# Patient Record
Sex: Female | Born: 1935 | Race: White | Hispanic: No | Smoking: Never smoker
Health system: Southern US, Community
[De-identification: ages and names within clinical notes are randomized; demographics above are authoritative.]

## PROBLEM LIST (undated history)

## (undated) DIAGNOSIS — I447 Left bundle-branch block, unspecified: Secondary | ICD-10-CM

## (undated) DIAGNOSIS — I8390 Asymptomatic varicose veins of unspecified lower extremity: Secondary | ICD-10-CM

## (undated) DIAGNOSIS — M1990 Unspecified osteoarthritis, unspecified site: Secondary | ICD-10-CM

## (undated) DIAGNOSIS — I2510 Atherosclerotic heart disease of native coronary artery without angina pectoris: Secondary | ICD-10-CM

## (undated) DIAGNOSIS — D649 Anemia, unspecified: Secondary | ICD-10-CM

## (undated) DIAGNOSIS — K219 Gastro-esophageal reflux disease without esophagitis: Secondary | ICD-10-CM

## (undated) HISTORY — PX: CHOLECYSTECTOMY: SHX55

## (undated) HISTORY — PX: KYPHOPLASTY: SHX5884

## (undated) HISTORY — PX: HIP FRACTURE SURGERY: SHX118

## (undated) HISTORY — PX: APPENDECTOMY: SHX54

---

## 1999-03-21 ENCOUNTER — Encounter (INDEPENDENT_AMBULATORY_CARE_PROVIDER_SITE_OTHER): Payer: Self-pay | Admitting: Specialist

## 1999-03-21 ENCOUNTER — Ambulatory Visit (HOSPITAL_COMMUNITY): Admission: RE | Admit: 1999-03-21 | Discharge: 1999-03-21 | Payer: Self-pay | Admitting: *Deleted

## 2000-01-18 ENCOUNTER — Other Ambulatory Visit: Admission: RE | Admit: 2000-01-18 | Discharge: 2000-01-18 | Payer: Self-pay | Admitting: Internal Medicine

## 2001-04-01 ENCOUNTER — Ambulatory Visit (HOSPITAL_COMMUNITY): Admission: RE | Admit: 2001-04-01 | Discharge: 2001-04-01 | Payer: Self-pay | Admitting: Internal Medicine

## 2001-04-11 ENCOUNTER — Emergency Department (HOSPITAL_COMMUNITY): Admission: EM | Admit: 2001-04-11 | Discharge: 2001-04-11 | Payer: Self-pay | Admitting: Emergency Medicine

## 2001-04-11 ENCOUNTER — Encounter: Payer: Self-pay | Admitting: Emergency Medicine

## 2001-06-05 ENCOUNTER — Encounter: Payer: Self-pay | Admitting: Otolaryngology

## 2001-06-05 ENCOUNTER — Encounter: Admission: RE | Admit: 2001-06-05 | Discharge: 2001-06-05 | Payer: Self-pay | Admitting: Otolaryngology

## 2001-07-10 ENCOUNTER — Ambulatory Visit (HOSPITAL_COMMUNITY): Admission: RE | Admit: 2001-07-10 | Discharge: 2001-07-10 | Payer: Self-pay | Admitting: *Deleted

## 2001-07-10 ENCOUNTER — Encounter (INDEPENDENT_AMBULATORY_CARE_PROVIDER_SITE_OTHER): Payer: Self-pay | Admitting: Specialist

## 2003-08-25 ENCOUNTER — Ambulatory Visit (HOSPITAL_COMMUNITY): Admission: RE | Admit: 2003-08-25 | Discharge: 2003-08-25 | Payer: Self-pay | Admitting: *Deleted

## 2003-08-25 ENCOUNTER — Encounter (INDEPENDENT_AMBULATORY_CARE_PROVIDER_SITE_OTHER): Payer: Self-pay | Admitting: Specialist

## 2004-02-27 ENCOUNTER — Encounter: Admission: RE | Admit: 2004-02-27 | Discharge: 2004-03-15 | Payer: Self-pay | Admitting: Internal Medicine

## 2004-11-15 ENCOUNTER — Ambulatory Visit: Payer: Self-pay | Admitting: Physical Medicine & Rehabilitation

## 2004-11-15 ENCOUNTER — Inpatient Hospital Stay (HOSPITAL_COMMUNITY): Admission: RE | Admit: 2004-11-15 | Discharge: 2004-11-19 | Payer: Self-pay | Admitting: Orthopedic Surgery

## 2004-11-19 ENCOUNTER — Ambulatory Visit: Payer: Self-pay | Admitting: Physical Medicine & Rehabilitation

## 2004-11-19 ENCOUNTER — Inpatient Hospital Stay
Admission: RE | Admit: 2004-11-19 | Discharge: 2004-12-03 | Payer: Self-pay | Admitting: Physical Medicine & Rehabilitation

## 2004-12-20 ENCOUNTER — Ambulatory Visit (HOSPITAL_COMMUNITY): Admission: RE | Admit: 2004-12-20 | Discharge: 2004-12-20 | Payer: Self-pay | Admitting: Internal Medicine

## 2005-09-08 ENCOUNTER — Emergency Department (HOSPITAL_COMMUNITY): Admission: EM | Admit: 2005-09-08 | Discharge: 2005-09-08 | Payer: Self-pay | Admitting: Emergency Medicine

## 2005-10-01 ENCOUNTER — Encounter (HOSPITAL_BASED_OUTPATIENT_CLINIC_OR_DEPARTMENT_OTHER): Admission: RE | Admit: 2005-10-01 | Discharge: 2005-10-09 | Payer: Self-pay | Admitting: Surgery

## 2007-12-15 ENCOUNTER — Encounter: Payer: Self-pay | Admitting: Gastroenterology

## 2007-12-17 ENCOUNTER — Encounter: Payer: Self-pay | Admitting: Gastroenterology

## 2007-12-17 ENCOUNTER — Encounter: Admission: RE | Admit: 2007-12-17 | Discharge: 2007-12-17 | Payer: Self-pay | Admitting: Internal Medicine

## 2007-12-24 ENCOUNTER — Telehealth: Payer: Self-pay | Admitting: Gastroenterology

## 2008-01-08 ENCOUNTER — Ambulatory Visit: Payer: Self-pay | Admitting: Gastroenterology

## 2008-01-08 DIAGNOSIS — R159 Full incontinence of feces: Secondary | ICD-10-CM | POA: Insufficient documentation

## 2008-01-08 DIAGNOSIS — R109 Unspecified abdominal pain: Secondary | ICD-10-CM | POA: Insufficient documentation

## 2008-01-08 DIAGNOSIS — D126 Benign neoplasm of colon, unspecified: Secondary | ICD-10-CM | POA: Insufficient documentation

## 2008-01-08 DIAGNOSIS — K5730 Diverticulosis of large intestine without perforation or abscess without bleeding: Secondary | ICD-10-CM | POA: Insufficient documentation

## 2008-01-08 DIAGNOSIS — I219 Acute myocardial infarction, unspecified: Secondary | ICD-10-CM | POA: Insufficient documentation

## 2008-01-08 DIAGNOSIS — Z860100 Personal history of colon polyps, unspecified: Secondary | ICD-10-CM | POA: Insufficient documentation

## 2008-01-08 DIAGNOSIS — I2510 Atherosclerotic heart disease of native coronary artery without angina pectoris: Secondary | ICD-10-CM | POA: Insufficient documentation

## 2008-01-08 DIAGNOSIS — K589 Irritable bowel syndrome without diarrhea: Secondary | ICD-10-CM | POA: Insufficient documentation

## 2008-01-08 DIAGNOSIS — K921 Melena: Secondary | ICD-10-CM | POA: Insufficient documentation

## 2008-01-08 DIAGNOSIS — K859 Acute pancreatitis without necrosis or infection, unspecified: Secondary | ICD-10-CM

## 2008-01-08 LAB — CONVERTED CEMR LAB
ALT: 20 units/L (ref 0–35)
BUN: 20 mg/dL (ref 6–23)
CO2: 32 meq/L (ref 19–32)
Calcium: 9.1 mg/dL (ref 8.4–10.5)
Creatinine, Ser: 0.8 mg/dL (ref 0.4–1.2)
GFR calc non Af Amer: 72 mL/min
Potassium: 4.5 meq/L (ref 3.5–5.1)
Sodium: 144 meq/L (ref 135–145)
Total Protein: 6.4 g/dL (ref 6.0–8.3)

## 2008-01-12 ENCOUNTER — Ambulatory Visit: Payer: Self-pay | Admitting: Gastroenterology

## 2008-01-12 ENCOUNTER — Encounter: Payer: Self-pay | Admitting: Gastroenterology

## 2008-01-15 ENCOUNTER — Encounter: Payer: Self-pay | Admitting: Gastroenterology

## 2008-01-18 ENCOUNTER — Ambulatory Visit (HOSPITAL_COMMUNITY): Admission: RE | Admit: 2008-01-18 | Discharge: 2008-01-18 | Payer: Self-pay | Admitting: Gastroenterology

## 2008-01-26 ENCOUNTER — Encounter: Payer: Self-pay | Admitting: Gastroenterology

## 2008-02-01 ENCOUNTER — Ambulatory Visit: Payer: Self-pay | Admitting: Gastroenterology

## 2008-02-01 DIAGNOSIS — K219 Gastro-esophageal reflux disease without esophagitis: Secondary | ICD-10-CM

## 2008-02-02 ENCOUNTER — Encounter: Payer: Self-pay | Admitting: Gastroenterology

## 2009-05-10 ENCOUNTER — Encounter: Admission: RE | Admit: 2009-05-10 | Discharge: 2009-05-10 | Payer: Self-pay | Admitting: Internal Medicine

## 2009-05-22 ENCOUNTER — Encounter: Admission: RE | Admit: 2009-05-22 | Discharge: 2009-05-22 | Payer: Self-pay | Admitting: Internal Medicine

## 2009-07-18 ENCOUNTER — Telehealth: Payer: Self-pay | Admitting: Gastroenterology

## 2009-07-21 ENCOUNTER — Encounter (INDEPENDENT_AMBULATORY_CARE_PROVIDER_SITE_OTHER): Payer: Self-pay | Admitting: *Deleted

## 2009-08-23 ENCOUNTER — Ambulatory Visit (HOSPITAL_COMMUNITY): Admission: RE | Admit: 2009-08-23 | Discharge: 2009-08-24 | Payer: Self-pay | Admitting: Otolaryngology

## 2009-08-23 ENCOUNTER — Encounter (INDEPENDENT_AMBULATORY_CARE_PROVIDER_SITE_OTHER): Payer: Self-pay | Admitting: Otolaryngology

## 2009-09-11 ENCOUNTER — Ambulatory Visit: Payer: Self-pay | Admitting: Gastroenterology

## 2009-09-11 DIAGNOSIS — R933 Abnormal findings on diagnostic imaging of other parts of digestive tract: Secondary | ICD-10-CM

## 2009-09-11 LAB — CONVERTED CEMR LAB
ALT: 18 units/L (ref 0–35)
AST: 30 units/L (ref 0–37)
Albumin: 3.8 g/dL (ref 3.5–5.2)
Basophils Relative: 0.4 % (ref 0.0–3.0)
Bilirubin, Direct: 0.1 mg/dL (ref 0.0–0.3)
Eosinophils Relative: 5.6 % — ABNORMAL HIGH (ref 0.0–5.0)
Hemoglobin: 12 g/dL (ref 12.0–15.0)
Lymphocytes Relative: 31.5 % (ref 12.0–46.0)
Lymphs Abs: 2.1 10*3/uL (ref 0.7–4.0)
MCHC: 34.5 g/dL (ref 30.0–36.0)
Monocytes Absolute: 0.6 10*3/uL (ref 0.1–1.0)
Neutrophils Relative %: 53.1 % (ref 43.0–77.0)
Platelets: 190 10*3/uL (ref 150.0–400.0)
RDW: 13.9 % (ref 11.5–14.6)
Total Protein: 6.7 g/dL (ref 6.0–8.3)
WBC: 6.6 10*3/uL (ref 4.5–10.5)

## 2009-09-13 ENCOUNTER — Ambulatory Visit (HOSPITAL_COMMUNITY): Admission: RE | Admit: 2009-09-13 | Discharge: 2009-09-13 | Payer: Self-pay | Admitting: Gastroenterology

## 2009-09-21 ENCOUNTER — Telehealth: Payer: Self-pay | Admitting: Gastroenterology

## 2010-04-17 NOTE — Assessment & Plan Note (Signed)
Summary: FOLLOW-UP, MAY NEED REPEAT MRCP.            Nicole   History of Present Illness Visit Type: Follow-up Visit Primary GI MD: Nicole Heaps MD [facility] Primary Provider: Juline Patch, MD Requesting Provider: na Chief Complaint: Patient complains of lower abdominal pain, fecal incontinence, and dark stools. She can not narrow down a time frame as to how long her stools have been dark she just says they are sometimes.  History of Present Illness:   [name] has returned for followup of her pancreatitis and pancreatic cyst.  She was last seen in November, 2009 following an episode of acute pancreatitis.  MRCP showed mild bile duct dilatation and a 2 cm multilobulated cystic lesion in the genu of  the pancreas.  She has not had any recurrences of severe abdominal pain.  Followup MRI was recommended in 6 weeks but this was not done.  She has mild postprandial burning epigastric pain for which he takes omeprazole.  She takes Aleve daily.  She complains of intermittent solid  black stools.  This latter complaint has been ongoing for 2 years.   GI Review of Systems    Reports abdominal pain.     Location of  Abdominal pain: lower abdomen.    Denies acid reflux, belching, bloating, chest pain, dysphagia with liquids, dysphagia with solids, heartburn, loss of appetite, nausea, vomiting, vomiting blood, weight loss, and  weight gain.      Reports black tarry stools and  fecal incontinence.     Denies anal fissure, change in bowel habit, constipation, diarrhea, diverticulosis, heme positive stool, hemorrhoids, irritable bowel syndrome, jaundice, light color stool, liver problems, rectal bleeding, and  rectal pain.    Current Medications (verified): 1)  Fish Oil 1200 Mg Caps (Omega-3 Fatty Acids) .Marland Kitchen.. 1 Capsule By Mouth Once Daily 2)  Icaps  Caps (Multiple Vitamins-Minerals) .Marland Kitchen.. 1 Capsule By Mouth Once Daily 3)  Sertraline Hcl 50 Mg Tabs (Sertraline Hcl) .Marland Kitchen.. 1 Tablet By Mouth Once Daily 4)   Klor-Con 10 10 Meq Cr-Tabs (Potassium Chloride) .... Take One-Two Tabs By Mouth Once Daily 5)  Furosemide 40 Mg Tabs (Furosemide) .... Take Two  Tablets By Mouth Once Daily 6)  Lunesta 2 Mg Tabs (Eszopiclone) .... Take One By Mouth At Bedtime 7)  Omeprazole 20 Mg Cpdr (Omeprazole) .Marland Kitchen.. 1 Tablet By Mouth Two Times A Day 30 Minutes Before Meals 8)  Multivitamins  Tabs (Multiple Vitamin) .... Take One By Mouth Once Daily 9)  Oscal 500/200 D-3 500-200 Mg-Unit Tabs (Calcium-Vitamin D) .... Take One By Mouth Once Daily 10)  Tylenol Pm Extra Strength 500-25 Mg Tabs (Diphenhydramine-Apap (Sleep)) .... Take One By Mouth At Bedtime 11)  Aleve 220 Mg Tabs (Naproxen Sodium) .... Take Two By Mouth Once Daily  Allergies (verified): 1)  ! Pcn  Past History:  Past Medical History: Reviewed history from 01/08/2008 and no changes required. Current Problems:  IBS (ICD-564.1) COLONIC POLYPS (ICD-211.3) AMI (ICD-410.90)  Past Surgical History: knee arthroscopy Rt. 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 No FH of Colon Cancer: Family History of Breast Cancer:Aunt and cousin Father died age 72  Stomach Problems  Social History: Reviewed history from 02/01/2008 and no changes required. Patient has never smoked.  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  The patient denies allergy/sinus, anemia, anxiety-new, blood in urine, breast changes/lumps, change in vision, confusion, cough, coughing up blood, depression-new, fainting, fatigue, fever, headaches-new, heart murmur, heart rhythm changes, itching, menstrual pain, night sweats, nosebleeds, pregnancy  symptoms, shortness of breath, skin rash, sore throat, swollen lymph glands, thirst - excessive , urination - excessive , urination changes/pain, and vision changes.         All other systems were reviewed and were negative   Vital Signs:  Patient profile:   77 year old female Height:      61.5 inches Weight:      147.4 pounds BMI:     27.50 Pulse rate:   84 / minute Pulse rhythm:   regular BP sitting:   108 / 64  (left arm) Cuff size:   regular  Vitals Entered By: [name] CMA [name]) (September 11, 2009 11:13 AM)  Physical Exam  Additional Exam:  On physical exam she is an elderly but healthy-appearing female  skin: anicteric HEENT: normocephalic; PEERLA; no nasal or pharyngeal abnormalities neck: supple nodes: no cervical lymphadenopathy chest: clear to ausculatation and percussion heart: no murmurs, gallops, or rubs abd: soft, nontender; BS normoactive; no abdominal masses, tenderness, organomegaly rectal: deferred ext: no cynanosis, clubbing, edema skeletal: no deformities neuro: oriented x 3; no focal abnormalities    Impression & Recommendations:  Problem # 1:  NONSPECIFIC ABN FINDING RAD & OTH EXAM GI TRACT (ICD-793.4) Etiology for her pancreatitis was never determined.  She had a pancreatic cyst.  History of intermittent black stools raises the question of GI bleeding.  Upper endoscopy for this problem in the past was pertinent for  reactive gastropathy only.  Medications #1 followup MRCP and MRI #2 CBC, amylase and LFTs  Problem # 2:  PERSONAL HISTORY OF COLONIC POLYPS (ICD-V12.72) Plan  no further followup given the patient's age  Problem # 3:  CAD (ICD-414.00) Assessment: Comment Only  Other Orders: TLB-CBC Platelet - w/Differential (85025-CBCD) TLB-Hepatic/Liver Function Pnl (80076-HEPATIC) TLB-Lipase (83690-LIPASE) TLB-Amylase (82150-AMYL) MRCP (MRCP)  Patient Instructions: 1)  Copy sent to : Nicole Pang,MD 2)  You will go to the basment for  labs today 3)  Your MRCP is scheduled for 09/13/2009 at [facility] radiology at 9am 4)  The medication list was reviewed and reconciled.  All changed / newly prescribed medications were explained.  A complete medication list was provided to the patient / caregiver.

## 2010-04-17 NOTE — Progress Notes (Signed)
Summary: MRI Needs to be scheduled  Phone Note Outgoing Call   Call placed by: Laureen Ochs LPN,  Jul 18, 1608 11:13 AM Call placed to: Patient Summary of Call: Pt. needs a f/u MRI scheduled. Message left for patient to callback.  Initial call taken by: Laureen Ochs LPN,  Jul 19, 9602 11:14 AM  Follow-up for Phone Call        Message left for patient to callback. Laureen Ochs LPN  Jul 20, 5407 9:35 AM  Message left for patient to callback. Laureen Ochs LPN  Jul 20, 8117 10:11 AM    Follow-up by: Laureen Ochs LPN,  Jul 21, 1476 11:47 AM  Additional Follow-up for Phone Call Additional follow up Details #1::        At this point she should be seen in the office Additional Follow-up by: Louis Meckel MD,  Jul 21, 2009 12:10 PM    Additional Follow-up for Phone Call Additional follow up Details #2::    Message left for pt. that Dr.Dandra Shambaugh wants to see her in the office, appt. scheduled for 08-01-09 at 2:30pm. I will also mail her a reminder letter. Pt. instructed to call back as needed.  Follow-up by: Laureen Ochs LPN,  Jul 22, 2954 3:27 PM

## 2010-04-17 NOTE — Letter (Signed)
Summary: Appt Reminder 2  [facility]  [address] [place], [place] [address]   Phone: [phone]  Fax: [phone]        Jul 21, 2009 MRN: [id number]    [name] [address] [place], [place]  [address]    Dear Ms. Cowley,   You have a return appointment with Dr.Robert Arlyce Dice on 08-01-09 at 2:30pm.  Please remember to bring a complete list of the medicines you are taking, your insurance card and your co-pay.  If you have to cancel or reschedule this appointment, please call before 5:00 pm the evening before to avoid a cancellation fee.  If you have any questions or concerns, please call [phone].    Sincerely,    Laureen Ochs LPN  Appended Document: Appt Reminder 2 Letter mailed to patient.

## 2010-04-17 NOTE — Progress Notes (Signed)
Summary: Triage  ---- Converted from flag ---- ---- 09/21/2009 10:13 AM, Louis Meckel MD wrote: Please tell pt that cyst is slightly enlarged but would not pursue this any further since she continues to feel well ------------------------------  Phone Note Outgoing Call   Call placed by: Laureen Ochs LPN,  September 21, 1608 10:58 AM Call placed to: Patient Summary of Call: Above MD orders reviewed with patient. Pt. c/o loose BM's after most meals. States she eats "Lots of fiber, I eat fruit all day." 1) May want to decrease fruit intake, it can cause loose stools 2) Add a daily fiber supplement like Metamucil or Citrucil. 3) If symptoms become worse call back immediately. 4) I will call pt., if new orders, after MD reviews.   Initial call taken by: Laureen Ochs LPN,  September 22, 9602 11:03 AM  Follow-up for Phone Call        c/b in 5 days; if no better try creon 2 tabs with each meal Follow-up by: Louis Meckel MD,  September 21, 2009 12:02 PM  Additional Follow-up for Phone Call Additional follow up Details #1::        Above MD orders reviewed with patient. Pt. instructed to call back as needed.  Additional Follow-up by: Laureen Ochs LPN,  September 22, 5407 12:47 PM

## 2010-05-03 ENCOUNTER — Inpatient Hospital Stay (HOSPITAL_COMMUNITY)
Admission: EM | Admit: 2010-05-03 | Discharge: 2010-05-09 | DRG: 481 | Disposition: A | Discharge status: Skilled Nursing Facility | Payer: Medicare Other | Attending: Orthopedic Surgery | Admitting: Orthopedic Surgery

## 2010-05-03 ENCOUNTER — Emergency Department (HOSPITAL_COMMUNITY): Payer: Medicare Other

## 2010-05-03 DIAGNOSIS — D62 Acute posthemorrhagic anemia: Secondary | ICD-10-CM | POA: Diagnosis not present

## 2010-05-03 DIAGNOSIS — R296 Repeated falls: Secondary | ICD-10-CM | POA: Diagnosis present

## 2010-05-03 DIAGNOSIS — Y998 Other external cause status: Secondary | ICD-10-CM

## 2010-05-03 DIAGNOSIS — S7223XA Displaced subtrochanteric fracture of unspecified femur, initial encounter for closed fracture: Principal | ICD-10-CM | POA: Diagnosis present

## 2010-05-03 DIAGNOSIS — I252 Old myocardial infarction: Secondary | ICD-10-CM

## 2010-05-03 DIAGNOSIS — Y9210 Unspecified residential institution as the place of occurrence of the external cause: Secondary | ICD-10-CM | POA: Diagnosis present

## 2010-05-03 DIAGNOSIS — Z86718 Personal history of other venous thrombosis and embolism: Secondary | ICD-10-CM

## 2010-05-03 DIAGNOSIS — K219 Gastro-esophageal reflux disease without esophagitis: Secondary | ICD-10-CM | POA: Diagnosis present

## 2010-05-03 LAB — CBC
HCT: 32.9 % — ABNORMAL LOW (ref 36.0–46.0)
Platelets: 158 10*3/uL (ref 150–400)
RBC: 3.29 MIL/uL — ABNORMAL LOW (ref 3.87–5.11)
RDW: 13.8 % (ref 11.5–15.5)
WBC: 6.5 10*3/uL (ref 4.0–10.5)

## 2010-05-03 LAB — DIFFERENTIAL
Basophils Absolute: 0 10*3/uL (ref 0.0–0.1)
Basophils Relative: 0 % (ref 0–1)
Eosinophils Relative: 2 % (ref 0–5)
Lymphocytes Relative: 26 % (ref 12–46)
Lymphs Abs: 1.7 10*3/uL (ref 0.7–4.0)
Monocytes Absolute: 0.5 10*3/uL (ref 0.1–1.0)
Neutro Abs: 4.1 10*3/uL (ref 1.7–7.7)

## 2010-05-03 LAB — COMPREHENSIVE METABOLIC PANEL
AST: 29 U/L (ref 0–37)
Calcium: 8.7 mg/dL (ref 8.4–10.5)
Chloride: 105 mEq/L (ref 96–112)
GFR calc Af Amer: 59 mL/min — ABNORMAL LOW (ref >60)
Potassium: 4.7 mEq/L (ref 3.5–5.1)
Sodium: 137 mEq/L (ref 135–145)

## 2010-05-05 ENCOUNTER — Inpatient Hospital Stay (HOSPITAL_COMMUNITY): Payer: Medicare Other

## 2010-05-05 LAB — PROTIME-INR
INR: 1.19 (ref 0.00–1.49)
Prothrombin Time: 15.3 seconds — ABNORMAL HIGH (ref 11.6–15.2)

## 2010-05-06 LAB — HEMOGLOBIN AND HEMATOCRIT, BLOOD: HCT: 25.8 % — ABNORMAL LOW (ref 36.0–46.0)

## 2010-05-06 LAB — BASIC METABOLIC PANEL
BUN: 13 mg/dL (ref 6–23)
CO2: 30 mEq/L (ref 19–32)
Calcium: 7.9 mg/dL — ABNORMAL LOW (ref 8.4–10.5)
Chloride: 103 mEq/L (ref 96–112)
Creatinine, Ser: 0.88 mg/dL (ref 0.4–1.2)
Glucose, Bld: 139 mg/dL — ABNORMAL HIGH (ref 70–99)

## 2010-05-07 DIAGNOSIS — M79609 Pain in unspecified limb: Secondary | ICD-10-CM

## 2010-05-07 LAB — TYPE AND SCREEN
Antibody Screen: NEGATIVE
Unit division: 0

## 2010-05-07 LAB — PROTIME-INR
INR: 2 — ABNORMAL HIGH (ref 0.00–1.49)
Prothrombin Time: 22.8 seconds — ABNORMAL HIGH (ref 11.6–15.2)

## 2010-05-07 LAB — POCT I-STAT 4, (NA,K, GLUC, HGB,HCT)
Glucose, Bld: 116 mg/dL — ABNORMAL HIGH (ref 70–99)
HCT: 25 % — ABNORMAL LOW (ref 36.0–46.0)
Sodium: 138 mEq/L (ref 135–145)

## 2010-05-08 LAB — HEMOGLOBIN AND HEMATOCRIT, BLOOD: HCT: 24.6 % — ABNORMAL LOW (ref 36.0–46.0)

## 2010-05-09 LAB — PROTIME-INR
INR: 2.24 — ABNORMAL HIGH (ref 0.00–1.49)
Prothrombin Time: 24.9 seconds — ABNORMAL HIGH (ref 11.6–15.2)

## 2010-05-11 NOTE — Op Note (Signed)
[name], DENHERDER              ACCOUNT NO.:  [id number]  MEDICAL RECORD NO.:  [id number]           PATIENT TYPE:  I  LOCATION:  5020                         FACILITY:  [facility]  PHYSICIAN:  Marlowe Kays, M.D.  DATE OF BIRTH:  04-16-1915  DATE OF PROCEDURE:  05/05/2010 DATE OF DISCHARGE:                              OPERATIVE REPORT   PREOPERATIVE DIAGNOSIS:  Closed displaced subtrochanteric fracture, left hip.  POSTOPERATIVE DIAGNOSIS:  Closed displaced subtrochanteric fracture, left hip.  OPERATION:  Closed reduction and internal fixation with left hip compression screw.  SURGEON:  Marlowe Kays, MD  ASSISTANT:  Alvy Beal, MD  ANESTHESIA:  General.  DESCRIPTION OF PROCEDURE:  Stated in diagnosis.  Despite her age of 66, she was in satisfactory health to undergo surgery.  PROCEDURE:  Prophylactic antibiotics, satisfied general anesthesia, placed in the [facility] fracture table.  Time-out performed with traction rotation.  We achieved an anatomic reduction essentially on the AP projection with slight posterior displacement of the distal fragment on the lateral projection.  Left hip was then prepped with DuraPrep, draped in sterile field.  C-arm employed.  I marked out using the C-arm tentative incision, carried down through the fascia lata, vastus lateralis was opened with finger dissection, the fracture site identified.  I then placed a Cobb elevator at where I felt would be the proximal incision for the guide pin and this was confirmed with the AP C- arm x-ray.  I then used a 135 degree angle guide to place a guide pin in the midportion of femoral neck and had AP projection slightly posteriorly on the lateral projection.  After measuring this at 65 mm, I then gently tapped the guide pin into the acetabulum for stability and drilled over the guide pin with the Synthes 2 step drill.  I checked along the way to be sure that the guide pin did not advance into  the acetabulum.  I then placed a 65 mm lag screw over the guide pin until it was in appropriate position on AP and lateral x-rays.  I then slipped a 5 hole Synthes plate over this and with Dr. Shon Baton elevating up the distal fragment with a mallet, I was able to place a temporary fixation screw towards the distal portion of the plate.  When the position of the plate was confirmed with AP and lateral C-arm x-rays, I then completed the distal fixation with a second screw tightening down both screws and then we moved cephalad and completed the final 3 screws in each case drilling, measuring, and screwing with 4.5 screws.  I then released little traction of her leg, impacted the fracture site.  Final pictures were taken confirming good position of the fracture plate and screws.  I then irrigated the wound well and placed in mL of Actifuse over the superior portion of the fracture site.  Wound was then closed in layers with running #1 Vicryl in the vastus lateralis, interrupted #1 Vicryl in fascia lata and deep subcutaneous tissue, 2-0 Vicryl superficial in subcutaneous tissue and staples in the skin with lax dressing applied after Betadine.  She was then gently  removed from her traction.  She has a total hip replacement on the right and throughout the case. We were very careful to protect this.  Estimated blood loss was perhaps 450 mL.  She was given 1 unit slowly in the operating room since she came in with hemoglobin of 10.6.  There were no complications.  At the time of dictation, she was on the way to PACU in satisfactory condition.          ______________________________ Marlowe Kays, M.D.     JA/MEDQ  D:  05/05/2010  T:  05/05/2010  Job:  [id number]  Electronically Signed by Marlowe Kays M.D. on 05/11/2010 04:25:51 PM

## 2010-05-11 NOTE — Discharge Summary (Signed)
[name], [name]              ACCOUNT NO.:  [id number]  MEDICAL RECORD NO.:  [id number]           PATIENT TYPE:  I  LOCATION:  5020                         FACILITY:  [facility]  PHYSICIAN:  [name], M.D.  DATE OF BIRTH:  [birth year]  DATE OF ADMISSION:  05/03/2010 DATE OF DISCHARGE:                        DISCHARGE SUMMARY - REFERRING   ADMITTING DIAGNOSES: 1. Subtrochanteric fracture, minimally displaced of the left hip. 2. History of basal cell tumor of right ear, removed. 3. History of ulcer disease. 4. History of remote seizure disorder, January 2003.  DISCHARGE DIAGNOSES: 1. Subtrochanteric fracture, minimally displaced of the left hip. 2. History of basal cell tumor of right ear, removed. 3. History of ulcer disease. 4. History of remote seizure disorder, January 2003. 5. Postoperative acute blood loss. 6. Postoperative anemia.  OPERATION:  On May 05, 2010, the patient underwent closed reduction and internal fixation of left hip with compression screw and side plates.  Dr. Venita Holden assisted.  BRIEF HISTORY:  This 77 year old white female, resident of [place], was finishing her dinner and getting up to reach for her walker, when she felt she twisted her left leg.  She apparently lost her balance at that time and fell onto her left hip.  She was unable to stand.  She was eventually brought to the emergency room of [facility], where x- rays revealed a subtrochanteric fracture of the left hip.  The patient suffered no other injuries, remained alert and cooperative on her initial evaluation and diagnoses.  I hoped that surgery could be done that night, but the operating room schedule did not allow it and became late into the evening.  It was decided both by the family and [name] for the surgery to be schedule the next day.  She was placed n.p.o. and surgery went ahead on the next morning.  COURSE IN THE HOSPITAL:  The patient  tolerated the surgical procedure quite well.  She did have some discomfort in her left lower extremity. It was thought earlier to be a morbid muscular situation that occurred during her trauma.  Doppler studies were done to be sure we did not have any vascular compromise.  The written report from the vascular lab showed there was some difficulty due to body habitus and edema.  There were no obvious evidence of DVT or superficial thrombosis of common femoral and posterior tibial and peroneal vein.  We could not fully access the popliteal vein on the left however.  The patient remained totally asymptomatic as far as any tenderness in the calf as a matter of fact I checked it today, and she had negative Homans', good ankle range of motion, and no calf tenderness with deep palpation.  The patient was placed in the physical therapy protocol to maintain touchdown weightbearing only.  She was able to do this somewhat with a lot of encouragement both with physical therapy as well as the floor staff.  Postoperative, she remained awake, alert, totally oriented.  Her son and wife were close in attendance.  It was felt that she would need some intensive rehabilitation postoperatively.  The family and she decided  that [facility] would be ideal and arrangements were made for that transfer.  On the day of this dictation, the wound was dry.  She had a postoperative dressing, which may be changed when she transfers to [place].  The calf was soft.  The patient had no other complaints.  LABORATORY VALUES IN THE HOSPITAL:  Preoperative hemoglobin and hematocrit of 10.6 and 32.9.  Her final hemoglobin was 10.8 and 24.6. She did receive 1 unit of blood intraoperatively.  The differential was completely within normal limits.  Blood chemistries were within normal limits.  Sodium was 137/138.  Potassium remained normal.  She did have a slightly high glucose of 128, 116, 139 respectively on different  days, but these were not n.p.o.  Chest x-ray showed mild cardiomegaly, but no edema.  No focal airspace disease, nor effusion.  She had remote left rib fractures.  X-rays of her left hip on admission showed a subtrochanteric left femoral fracture.  She also had on the same film evidence of a right total hip arthroplasty, which was done in the distant past.  Postoperatively, the x-rays of left hip show plate and screw fixation, subtrochanteric left femoral fracture had been performed with good position noted on the spot films taken intraoperatively.  CONDITION ON DISCHARGE:  Improved and stable.  PLAN:  The patient is to continue with her physical therapy twice a day or more would be preferable.  She is to have touchdown weightbearing of left lower extremity only.  Dry dressing as needed to the left hip. This may be done when she arrives.  She is to return in sometime to see [name] in about 2 weeks after date of surgery.  The appointment can be made by calling [name] office at [phone].  She is to have diet as tolerated.  She is avoid any greens.  We will desire to answer any questions orthopedically upon phone call.  Any problems concerning her general medical condition or change in her medications can be directed to her medical physician.  CURRENT MEDICATIONS: 1. Tylenol 500 mg at bedtime. 2. Beta-carotene with minerals one tablet daily. 3. Os-Cal 1 tablet b.i.d. 4. Benadryl 25 mg p.o. at bedtime. 5. Lasix 40 mg p.o. daily. 6. Therapeutic multivitamins 1 tablet daily. 7. Protonix 40 mg one every 1200 hours. 8. Potassium chloride (K-Dur) 10 mEq 1 p.o. daily. 9. Zoloft 50 mg one p.o. daily. 10.She is now on the Coumadin protocol and pharmacy at [facility]     have recommended daily dose at 2.5 mg at 6:00 p.m. to keep the INR     between 2 and 3.  OTHER MEDICATIONS: 1. Dulcolax 5-10 mg daily p.r.n. 2. Robaxin 500 mg one q.6 h. p.r.n. muscle spasm. 3.  Oxycodone/APAP 5/325 1-2 tablets q.6 h. p.r.n. pain.  Again, any other problems or questions concerning her medications should be directed towards her family physician or internal medicine physician.     Dooley L. Cherlynn June.   ______________________________ [name], M.D.    DLU/MEDQ  D:  05/08/2010  T:  05/08/2010  Job:  [id number]  cc:   [name], M.D.  Electronically Signed by [name] M.D. on 05/11/2010 04:26:08 PM

## 2010-05-31 ENCOUNTER — Inpatient Hospital Stay (HOSPITAL_COMMUNITY): Payer: Medicare Other

## 2010-05-31 ENCOUNTER — Inpatient Hospital Stay (HOSPITAL_COMMUNITY)
Admission: RE | Admit: 2010-05-31 | Discharge: 2010-06-04 | DRG: 470 | Disposition: A | Discharge status: Skilled Nursing Facility | Payer: Medicare Other | Source: Ambulatory Visit | Attending: Orthopedic Surgery | Admitting: Orthopedic Surgery

## 2010-05-31 DIAGNOSIS — Z9181 History of falling: Secondary | ICD-10-CM

## 2010-05-31 DIAGNOSIS — T84498A Other mechanical complication of other internal orthopedic devices, implants and grafts, initial encounter: Secondary | ICD-10-CM | POA: Diagnosis present

## 2010-05-31 DIAGNOSIS — I2510 Atherosclerotic heart disease of native coronary artery without angina pectoris: Secondary | ICD-10-CM | POA: Diagnosis present

## 2010-05-31 DIAGNOSIS — S72009S Fracture of unspecified part of neck of unspecified femur, sequela: Secondary | ICD-10-CM

## 2010-05-31 DIAGNOSIS — D649 Anemia, unspecified: Secondary | ICD-10-CM | POA: Diagnosis not present

## 2010-05-31 DIAGNOSIS — IMO0002 Reserved for concepts with insufficient information to code with codable children: Secondary | ICD-10-CM | POA: Diagnosis present

## 2010-05-31 DIAGNOSIS — Y838 Other surgical procedures as the cause of abnormal reaction of the patient, or of later complication, without mention of misadventure at the time of the procedure: Secondary | ICD-10-CM | POA: Diagnosis present

## 2010-05-31 DIAGNOSIS — K219 Gastro-esophageal reflux disease without esophagitis: Secondary | ICD-10-CM | POA: Diagnosis present

## 2010-05-31 DIAGNOSIS — I252 Old myocardial infarction: Secondary | ICD-10-CM

## 2010-05-31 LAB — BASIC METABOLIC PANEL
Calcium: 9.3 mg/dL (ref 8.4–10.5)
Creatinine, Ser: 0.78 mg/dL (ref 0.4–1.2)
GFR calc Af Amer: >60 mL/min (ref >60)
GFR calc non Af Amer: >60 mL/min (ref >60)
Glucose, Bld: 104 mg/dL — ABNORMAL HIGH (ref 70–99)
Sodium: 142 mEq/L (ref 135–145)

## 2010-05-31 LAB — DIFFERENTIAL
Basophils Absolute: 0 10*3/uL (ref 0.0–0.1)
Eosinophils Absolute: 0.5 10*3/uL (ref 0.0–0.7)
Eosinophils Relative: 7 % — ABNORMAL HIGH (ref 0–5)
Lymphocytes Relative: 43 % (ref 12–46)
Lymphs Abs: 3 10*3/uL (ref 0.7–4.0)
Neutrophils Relative %: 41 % — ABNORMAL LOW (ref 43–77)

## 2010-05-31 LAB — PROTIME-INR: INR: 1.12 (ref 0.00–1.49)

## 2010-05-31 LAB — CBC
MCV: 104.3 fL — ABNORMAL HIGH (ref 78.0–100.0)
Platelets: 253 10*3/uL (ref 150–400)
RBC: 3.47 MIL/uL — ABNORMAL LOW (ref 3.87–5.11)
RDW: 15.3 % (ref 11.5–15.5)
WBC: 6.9 10*3/uL (ref 4.0–10.5)

## 2010-05-31 LAB — APTT: aPTT: 30 seconds (ref 24–37)

## 2010-06-01 LAB — CBC
HCT: 42.4 % (ref 36.0–46.0)
Hemoglobin: 13.6 g/dL (ref 12.0–15.0)
MCV: 94 fL (ref 78.0–100.0)
RDW: 18.9 % — ABNORMAL HIGH (ref 11.5–15.5)
WBC: 9.4 10*3/uL (ref 4.0–10.5)

## 2010-06-01 LAB — BASIC METABOLIC PANEL
BUN: 22 mg/dL (ref 6–23)
CO2: 30 mEq/L (ref 19–32)
GFR calc non Af Amer: 51 mL/min — ABNORMAL LOW (ref >60)
Glucose, Bld: 148 mg/dL — ABNORMAL HIGH (ref 70–99)
Potassium: 5.9 mEq/L — ABNORMAL HIGH (ref 3.5–5.1)
Sodium: 140 mEq/L (ref 135–145)

## 2010-06-01 LAB — URINE CULTURE
Colony Count: NO GROWTH
Culture  Setup Time: 201203160142
Culture: NO GROWTH

## 2010-06-01 NOTE — Op Note (Signed)
NAMECLARRISA, [name]              ACCOUNT NO.:  [id number]  MEDICAL RECORD NO.:  [id number]           PATIENT TYPE:  I  LOCATION:  1619                         FACILITY:  [facility]  PHYSICIAN:  Madlyn Frankel. Charlann Boxer, M.D.  DATE OF BIRTH:  10/26/1915  DATE OF PROCEDURE:  05/31/2010 DATE OF DISCHARGE:                              OPERATIVE REPORT   PREOPERATIVE DIAGNOSIS:  Failed left hip surgery after open reduction internal fixation of subtrochanteric femur fracture utilizing the sideplate screws and compression screw.  POSTOPERATIVE DIAGNOSIS:  Failed left hip surgery after open reduction internal fixation of subtrochanteric femur fracture utilizing the sideplate screws and compression screw.  PROCEDURE:  Conversion of previous left hip surgery to a left total hip replacement with autologous bone grafting of an acetabular defect in addition to repairing of open reduction and internal fixation of the left greater trochanter back to the prosthetic component.  COMPONENTS USED:  DePuy hip system, size 48 pinnacle shell, 32 plus 4 neutral marathon liner, size 15 small body AML stem with a 32 plus 1 ball.  SURGEON:  Madlyn Frankel. Charlann Boxer, M.D.  ASSISTANT:  Georges Lynch. Darrelyn Hillock, M.D.  ANESTHESIA:  General.  SPECIMENS:  None.  COMPLICATIONS:  None apparent.  ESTIMATED BLOOD LOSS:  1500 cc.  DRAINS:  One Hemovac.  FLUIDS AND BLOOD TRANSFUSIONS:  Per anesthetic record based on the preoperative hemoglobin of 10.  INDICATIONS FOR PROCEDURE:  [name] is a 76 year old female who unfortunately had a fall and sustained a peritrochanteric femur fracture approximately 3 weeks ago.  She had an open reduction and internal fixation performed.  She had come in to our office for initial visit 2 weeks out with obvious failure of the device with penetration of the acetabulum and displacement of the proximal peritrochanteric segment.  She was seen and evaluated in the office and reviewed with her  the implications of the current findings and the need for surgery.  The risks of infection, component failure, need for revision surgery as well as dislocation were all discussed and reviewed with this 77 year old female and consent obtained.  PROCEDURE IN DETAIL:  The patient was brought to the operative theater. Once adequate anesthesia, preoperative antibiotics, Ancef administered, she was positioned into the right lateral decubitus position with left side up.  The left lower extremity was then prepped and draped in sterile fashion.  Time-out was performed identifying the patient, planned procedure and the extremity.  In a lateral position, her incision from the previous sideplate was a bit more anterior than what I was able to utilize for posterior approach to the hip.  Thus that anterolateral incision as well as the posterior incision were demarcated.  The lateral incision was first made excising old skin.  Sharp dissection was carried down to the lateral structures.  This was then incised evacuating a large seroma.  This went directly down to the sideplate screws, 5, 4.5 mm screws from the sideplate were removed.  The sideplate was then removed and the screw was removed by the use of a tonsil.  At this point, I packed off this wound and now attended to the posterior  approach.  The posterolateral approach was made based at the proximal trochanter.  Sharp dissection was carried to the iliotibial band and gluteal fascia which were then incised posteriorly.  The posterior aspect of the proximal femur was then exposed.  An L capsulotomy was made at this point just proximal to the piriformis.  With the posterior aspect the hip exposed, I was able to retract the trochanter anteriorly somewhat and removed the femoral head.  Following removal of the femoral head, I went ahead and placed retractors for the acetabulum.  I retracted the greater trochanteric segment anteriorly with a  Cobb retractor, placed a belly Cobra inferiorly.  I removed the labrum and foveal tissue, identifying the big deep acetabular defect.  I then began reaming with 43 reamer and reamed up just to 43 reamer and chose a 48 pinnacle cup.  I used the patient's femoral head and remaining bone stock and used the rongeur to create autologous bone graft which I packed into the defect.  The final 48 pinnacle cup was then impacted using a curved impactor and it seemed to be positioned based on the use of the cup positioner at about 35 to 40 degrees of abduction and was positioned anatomically beneath the anterior rim with portion of the cup exposed superolaterally.  The 2 cancellous screws were placed to support the initial fixation and the final 32  plus 4 marathon liner impacted.  At this point, I attended to the femur.  Further exposure of the proximal femur was carried out removing displaced and smaller bone fragments.  With the proximal femur exposed, I began reaming with a 10 reamer and reamed up to 14.5 mm reamer where I got good distal fixation in bone.  I went ahead and did trial with a 15 small body broach which got some initial fixation as the broach impacted in the proximal third of the femur.  I did a trial reduction and was satisfied with the stability and component position and thus chose the 15 small body component.  The 15 small body AML component was opened.  I had measured the depth of the placement of broach based off the proximal third segment as a marker for my depth of impaction.  The final stem was then impacted using a reamer to maintain my anteversion at about 20 degrees.  With the stem impacted the level eventually back down to where the broach had sat, the trial reduction was carried out.  I did determine to try to impact it down a couple of more millimeters based off the leg length assessment, however, the hip stability was excellent mechanically at this point without  evidence of impingement with forward flexion and internal rotation to 80 degrees or in the sleep position or with external rotation and abduction.  Based on this, the final 32 plus 1 ball was chosen and impacted on to a clean and dry trunnion and the hip reduced.  At this point, I attended to the trochanteric segment.  After removing a portion of the anterior bone that was remaining, I chose to use a Zimmer cable claw with cables.  With the rongeur, I also removed some of the bone from the trochanteric segments to allow it to sit on the shoulder of the prosthesis without significant lateralization.  Using a Jackson clamp, I held the trochanter in its appropriate position.  I held the claw in its appropriate position for stability and passed the cables through the opening in the proximal femur, femoral  component.  The 2 cables were then tightened down with the tensioner and then wires cut once the cables had been crimped down to the claw.  The trochanteric segment was relatively stable at this point in near anatomic position.  Following final irrigation of both wounds, the anterolateral incision was reapproximated with deep #1 Vicryl on the lateral iliotibial band. The subcutaneous layer with 2-0 Vicryl and staples on the skin.  The posterior incision I reapproximated the capsular tissue using #1 Vicryl. I placed a medium Hemovac drain in this posterior wound.  I then reapproximated the iliotibial band and gluteal fascia using #1 Vicryl.  The remainder of the wound was closed with 2-0 Vicryl in running and then staples.  The hip and thigh wounds at this point were cleaned and dried.  I used a bulky Xeroform dressing on both the thigh and posterior wound and taped them.  The patient was then extubated, brought to recovery room in stable condition tolerating the procedure well.     Madlyn Frankel Charlann Boxer, M.D.     MDO/MEDQ  D:  05/31/2010  T:  06/01/2010  Job:  [id number]  Electronically  Signed by Durene Romans M.D. on 06/01/2010 03:06:58 PM

## 2010-06-02 LAB — BASIC METABOLIC PANEL
Chloride: 102 mEq/L (ref 96–112)
GFR calc Af Amer: >60 mL/min (ref >60)
GFR calc non Af Amer: 52 mL/min — ABNORMAL LOW (ref >60)
Potassium: 4.9 mEq/L (ref 3.5–5.1)
Sodium: 137 mEq/L (ref 135–145)

## 2010-06-02 LAB — CBC
Platelets: 169 10*3/uL (ref 150–400)
RBC: 3.59 MIL/uL — ABNORMAL LOW (ref 3.87–5.11)
RDW: 17.7 % — ABNORMAL HIGH (ref 11.5–15.5)
WBC: 10.1 10*3/uL (ref 4.0–10.5)

## 2010-06-04 LAB — TYPE AND SCREEN
ABO/RH(D): A NEG
Antibody Screen: NEGATIVE
Unit division: 0
Unit division: 0
Unit division: 0

## 2010-06-04 LAB — URINALYSIS, ROUTINE W REFLEX MICROSCOPIC
Bilirubin Urine: NEGATIVE
Glucose, UA: NEGATIVE mg/dL
Hgb urine dipstick: NEGATIVE
Ketones, ur: NEGATIVE mg/dL
Nitrite: NEGATIVE
Protein, ur: NEGATIVE mg/dL
Specific Gravity, Urine: 1.01 (ref 1.005–1.030)
Urobilinogen, UA: 0.2 mg/dL (ref 0.0–1.0)
pH: 5.5 (ref 5.0–8.0)

## 2010-06-04 LAB — BASIC METABOLIC PANEL
CO2: 31 mEq/L (ref 19–32)
CO2: 33 mEq/L — ABNORMAL HIGH (ref 19–32)
Calcium: 9.5 mg/dL (ref 8.4–10.5)
Chloride: 102 mEq/L (ref 96–112)
Chloride: 106 mEq/L (ref 96–112)
Creatinine, Ser: 0.96 mg/dL (ref 0.4–1.2)
Creatinine, Ser: 1 mg/dL (ref 0.4–1.2)
GFR calc Af Amer: >60 mL/min (ref >60)
GFR calc Af Amer: >60 mL/min (ref >60)
Glucose, Bld: 94 mg/dL (ref 70–99)
Sodium: 141 mEq/L (ref 135–145)

## 2010-06-04 LAB — BASIC METABOLIC PANEL WITH GFR
BUN: 19 mg/dL (ref 6–23)
BUN: 27 mg/dL — ABNORMAL HIGH (ref 6–23)
Calcium: 9.5 mg/dL (ref 8.4–10.5)
GFR calc non Af Amer: 52 mL/min — ABNORMAL LOW (ref >60)
GFR calc non Af Amer: 54 mL/min — ABNORMAL LOW (ref >60)
Glucose, Bld: 114 mg/dL — ABNORMAL HIGH (ref 70–99)
Potassium: 5 meq/L (ref 3.5–5.1)
Potassium: 5 meq/L (ref 3.5–5.1)
Sodium: 140 meq/L (ref 135–145)

## 2010-06-04 LAB — CBC
HCT: 36.3 % (ref 36.0–46.0)
HCT: 36.7 % (ref 36.0–46.0)
Hemoglobin: 12.2 g/dL (ref 12.0–15.0)
Hemoglobin: 12.6 g/dL (ref 12.0–15.0)
MCHC: 33.3 g/dL (ref 30.0–36.0)
MCHC: 34.5 g/dL (ref 30.0–36.0)
MCV: 100.6 fL — ABNORMAL HIGH (ref 78.0–100.0)
MCV: 102.9 fL — ABNORMAL HIGH (ref 78.0–100.0)
Platelets: 157 K/uL (ref 150–400)
Platelets: 177 K/uL (ref 150–400)
RBC: 3.56 MIL/uL — ABNORMAL LOW (ref 3.87–5.11)
RBC: 3.61 MIL/uL — ABNORMAL LOW (ref 3.87–5.11)
RDW: 14 % (ref 11.5–15.5)
RDW: 14.1 % (ref 11.5–15.5)
WBC: 6.7 K/uL (ref 4.0–10.5)
WBC: 9 10*3/uL (ref 4.0–10.5)

## 2010-06-04 LAB — BLOOD GAS, ARTERIAL
Acid-Base Excess: 2.8 mmol/L — ABNORMAL HIGH (ref 0.0–2.0)
Drawn by: 277551
O2 Content: 1 L/min
O2 Saturation: 99.5 %
TCO2: 29.1 mmol/L (ref 0–100)

## 2010-06-04 LAB — TSH: TSH: 3.302 u[IU]/mL (ref 0.350–4.500)

## 2010-06-04 LAB — T4, FREE: Free T4: 1.18 ng/dL (ref 0.80–1.80)

## 2010-06-04 NOTE — Discharge Summary (Signed)
NAMETERRANCE, [name]              ACCOUNT NO.:  [id number]  MEDICAL RECORD NO.:  [id number]           PATIENT TYPE:  I  LOCATION:  1619                         FACILITY:  [facility]  PHYSICIAN:  Madlyn Frankel. Charlann Boxer, M.D.  DATE OF BIRTH:  1915/10/21  DATE OF ADMISSION:  05/31/2010 DATE OF DISCHARGE:  06/04/2010                        DISCHARGE SUMMARY - REFERRING   ADMITTING DIAGNOSIS:  Failed left hip surgery, following open reduction and internal fixation of subtrochanteric femur fracture with penetration of acetabulum with hardware.  ADMITTING HISTORY:  [name] is a 77 year old female who I had previously addressed some right hip issues in the past.  She unfortunately had a fall about 3 weeks prior to admission where she had sustained a subtrochanteric femur fracture.  She was treated with an open reduction and internal fixation.  She was initially seen and evaluated at her initial 2-week visit and noted to have nonunion fracture fixation failure with penetration of the hip screw into the acetabulum and displacing the fracture site.  She was seen and evaluated in the office and set up for surgery to be a same-day admit add-on case.  After reviewing with her the necessity of the surgery, risks and benefits were discussed with necessity of this prevailed any major rift. She is 77 years of age and this developed patient consideration in terms of her current status, this is the case that needs to be performed.  HOSPITAL COURSE:  The patient was admitted for same-day surgery on May 31, 2010.  She underwent a conversion of previous left hip surgery to left total hip replacement.  Intraoperatively, she did require 4 units of blood; however in her postop stay, she did not require any further blood, in fact her hematocrit and hemoglobin remained very stable.  Please see dictated operative note for full details of the procedure. Postoperatively, she was transferred to recovery room and  then to the orthopedic ward where she remained.  Given the fact that the surgery was done on Thursday, she was unable to be discharged until Monday, requiring a 4-day hospital stay.  Her hospital stay was otherwise unremarkable.  She was seen and evaluated by Physical Therapy.  She was initially placed on restriction at 50% weightbearing to allow for bony ingrowth.  She was placed on a regular diet and was tolerating it well. Her wound remained stable.  By postop day #2, she was noted to have a hematocrit of 34.2.  She did not have any further need for any blood.  She was placed on Lovenox and mechanical DVT prophylaxis as well as mobility.  By postop day #4, she was ready for discharge.  DISCHARGE INSTRUCTIONS:  The patient will be discharged to nursing facility for rehab.  Potentially, she will be seen and evaluated by Physical Therapy and Occupational Therapy with partial weightbearing at 50% in the left lower extremity.  Her dressing should be dry and changed daily as needed with a dry gauze and tape.  She will return to see Dr. Durene Holden, [facility], at [phone] in a 2-week period time for wound evaluation and staple removal.  She will be on  a regular diet.  DISCHARGE MEDICATIONS: 1. Colace 100 mg p.o. b.i.d. 2. Lovenox 40 mg subcu injections daily for 10 days. 3. Norco 5/325 one to two tablets every 4 to 6 hours as needed for     pain. 4. Benadryl 25 mg q.h.s. as needed for sleep and itching. 5. Bisacodyl 5 mg 2 tablets daily as needed. 6. Calcium carbonate 500 mg b.i.d. 7. Iron 325 mg p.o. b.i.d. for 20 days. 8. Lasix 40 mg p.o. q.a.m. 9. Lunesta 2 mg p.o. q.h.s. 10.Multivitamins p.o. daily. 11.Percocet 5/325 one to two tablets every 6 to 8 hours as needed for     breakthrough pain or pain worse to have Norco as tolerated. 12.Potassium 20 mEq p.o. q.a.m. 13.Protonix 40 mg b.i.d. 14.Robaxin 500 mg p.o. q.6 h. as needed for muscle spasm pain. 15.Valium 5  mg one-half tablet every morning. 16.Zoloft 50 mg p.o. q.a.m.  Orthopedic questions can be addressed to [facility]. Medical issues can be addressed through her current treating medical physician.     Madlyn Frankel Charlann Boxer, M.D.     MDO/MEDQ  D:  06/04/2010  T:  06/04/2010  Job:  [id number]  Electronically Signed by [name] M.D. on 06/04/2010 02:27:30 PM

## 2010-08-03 NOTE — Discharge Summary (Signed)
[name], [name]              ACCOUNT NO.:  [id number]   MEDICAL RECORD NO.:  [id number]          PATIENT TYPE:  INP   LOCATION:  1507                         FACILITY:  [facility]   PHYSICIAN:  [name]. Holden, M.D.DATE OF BIRTH:  1915-03-27   DATE OF ADMISSION:  11/15/2004  DATE OF DISCHARGE:  11/19/2004                                 DISCHARGE SUMMARY   ADMISSION DIAGNOSES:  1.  Disruption of the poly liner with broken wire in post right total hip      replacement arthroplasty.  2.  Remote history of myocardial infarction.  3.  Reflux.   DISCHARGE DIAGNOSES:  1.  Disruption of the poly liner with broken wire in post right total hip      replacement arthroplasty.  2.  Remote history of myocardial infarction.  3.  Reflux.  4.  Postoperative hemorrhagic anemia treated with transfusion.   OPERATION:  On November 15, 2004 the patient underwent:  1.  Revision of right total hip replacement with acetabular component and      femoral head.  2.  Extensive synovectomy and removal of metal debris, [name],      Geophysicist/field seismologist.   CONSULTATIONS:  Rehabilitation Medicine.   BRIEF HISTORY:  This 77 year old lady, a patient of [name] of  our practice, presented to the office to him around the 16th of August with  complaints of increasing pain into the right hip with groin pain in the same  side.  X-rays taken as well as those in the not too distant past showed a  fractured acetabular liner, and the metal ring was displaced.  [name] saw  the patient in consult for [name].  It was decided to go ahead with  surgical intervention, as this lady really could not ambulate and had to use  a walker due to the pain and instability of the hip.  After much discussion  including the risks and benefits of surgery, it was decided to go ahead with  the above procedure.   COURSE IN THE HOSPITAL:  The patient tolerated the surgical procedure quite  well.  She was placed on  anticoagulation protocol postoperatively  for  prevention of DVT.  She had been on gingko biloba.  She was on a study with  that, and Dr. Esmeralda Holden, Pharm D, counseled her on that because she was on  anticoagulation protocol.  The patient progressed slowly in physical  therapy.  It was noted that she had a postoperative hemorrhagic anemia with  the hemoglobin dropping to 8.8.  We transfused her with 2 units of banked  blood, which brought her hemoglobin up to 11.1, and her final hemoglobin was  10.8 with a hematocrit of 31.1.  It was thought that she would benefit with  an inpatient rehabilitation program or skilled nursing.  A bed became  available in the subacute care unit, and it was decided that it would be  best for her to have an intensive rehab program and then return to her home  with home health.   Orthopedically the operative extremity remained intact  with suture line  having no drainage.  Neurovascular is intact in the operative extremity.   The patient was noted to have a mild urinary tract infection.  This was  treated with Cipro while in the hospital.   LABORATORY VALUES:  Laboratory values in the hospital hematologically showed  an admission CBC which was completely within normal limits.  She did have an  increased MCV of 102.9.  Her hemoglobin was 13.9, hematocrit 40.7.  White  count was normal.  Final hemoglobin was 10.8, hematocrit 31.1.  Blood  chemistries showed a hyponatremia (fluids restricted), and her final sodium  was 132.  The urinalysis preoperatively showed trace leukocyte esterase.  Electrocardiogram showed sinus rhythm with occasional premature ventricular  complexes, left axis deviation.  Chest x-ray showed no active disease, and  the postoperative films showed well-seated components of a total hip  prosthesis without complicating features.   CONDITION ON DISCHARGE:  Improved, stable.   PLAN:  The patient is to be transferred to [facility] to the   subacute care unit to continue with her total hip protocol.  We will follow  her over there during her rehabilitation process.      [name].    ______________________________  [name] [name], M.D.    DLU/MEDQ  D:  01/07/2005  T:  01/07/2005  Job:  [id number]

## 2010-08-03 NOTE — Op Note (Signed)
[name], [name]                        ACCOUNT NO.:  [id number]   MEDICAL RECORD NO.:  [id number]                   PATIENT TYPE:  AMB   LOCATION:  ENDO                                 FACILITY:  [facility]   PHYSICIAN:  Georgiana Spinner, M.D.                 DATE OF BIRTH:  05/15/1915   DATE OF PROCEDURE:  08/25/2003  DATE OF DISCHARGE:                                 OPERATIVE REPORT   PROCEDURE PERFORMED:  Upper endoscopy.   ENDOSCOPIST:  Georgiana Spinner, M.D.   INDICATIONS FOR PROCEDURE:  Gastroesophageal reflux disease.   ANESTHESIA:  Demerol 10 mg, Versed 2 mg.   DESCRIPTION OF PROCEDURE:  With the patient mildly sedated in the left  lateral decubitus position, the Olympus video endoscope was inserted in the  mouth and passed under direct vision through the esophagus which appeared  normal until we reached the distal esophagus and there was a question of  Barrett's photographed and biopsied.  We entered into the stomach.  The  fundus, body, antrum, duodenal bulb and second portion of the duodenum all  appeared normal.  From this point, the endoscope was slowly withdrawn taking  circumferential views of the duodenal mucosa until the endoscope was pulled  back into the stomach and placed on retroflexion to view the stomach from  below.  The endoscope was then straightened and withdrawn taking  circumferential views of the remaining gastric and esophageal mucosa.  The  patient's vital signs and pulse oximeter remained stable.  The patient  tolerated the procedure well without apparent complications.   FINDINGS:  Question of Barrett's esophagus, biopsied.  Await biopsy report.  Patient will call me for results and follow up with me as an outpatient.  Proceed to colonoscopy as planned.   PLAN:                                               Georgiana Spinner, M.D.    GMO/MEDQ  D:  08/25/2003  T:  08/25/2003  Job:  [id number]

## 2010-08-03 NOTE — Procedures (Signed)
[facility]  Patient:    [name], [name] Visit Number: [id number] MRN: [id number]          Service Type: END Location: ENDO Attending Physician:  Sabino Gasser Dictated by:   Sabino Gasser, M.D. Proc. Date: 07/10/01 Admit Date:  07/10/2001   CC:         Lenon Curt. Cassell Clement, M.D.   Procedure Report  PROCEDURE:  Upper endoscopy.  INDICATIONS:  Gastroesophageal reflux disease.  ANESTHESIA: Demerol 50 mg, Versed 4 mg.  DESCRIPTION OF PROCEDURE:  With the patient mildly sedated in the left lateral decubitus position, the Olympus videoscopic endoscope was inserted into the mouth and passed under direct vision through the esophagus. Distal esophagus was approached and did seem to show an area of short segment Barretts esophagus; we photographed and biopsied. We entered into the stomach. Fundus, body, antrum, duodenal bulb, second portion of the duodenum all appeared normal. From this point, the endoscope was slowly withdrawn taking circumferential views of the entire duodenal mucosa until the endoscope then pulled back into the stomach and placed in retroflexion to view the stomach from below. The endoscope was then straightened and withdrawn taking circumferential views of the remaining gastric and esophageal mucosa. The patients vital signs and pulse oximeter remained stable. The patient tolerated the procedure well without apparent complications.  FINDINGS:  Question of Barretts esophagus, biopsied.  PLAN:  Await biopsy report. Patient will call me for results and follow up with me as an outpatient. Proceed to colonoscopy as planned. Dictated by:   Sabino Gasser, M.D. Attending Physician:  Sabino Gasser DD:  07/10/01 TD:  07/10/01 Job: [id number]

## 2010-08-03 NOTE — Op Note (Signed)
NAMEADILEE, LEMME                        ACCOUNT NO.:  [id number]   MEDICAL RECORD NO.:  [id number]                   PATIENT TYPE:  AMB   LOCATION:  ENDO                                 FACILITY:  [facility]   PHYSICIAN:  Georgiana Spinner, M.D.                 DATE OF BIRTH:  [birth year]   DATE OF PROCEDURE:  08/25/2003  DATE OF DISCHARGE:                                 OPERATIVE REPORT   PROCEDURE:  Colonoscopy.   INDICATIONS FOR PROCEDURE:  Colon polyps.   ANESTHESIA:  Demerol 40, Versed 4 mg.   PROCEDURE:  With the patient mildly sedated in the left lateral decubitus  position, the Olympus videoscopic colonoscope was inserted in the rectum and  passed under direct vision through a tortuous sigmoid colon to the cecum  identified by the ileocecal valve and appendiceal orifice, both of which  were photographed.  From this point, the colonoscope was slowly withdrawn  taking circumferential views of the colonic mucosa stopping to photograph  diverticulosis seen in the sigmoid colon until we reached the rectum which  appeared normal on direct and showed hemorrhoids on retroflex view.  The  endoscope was straightened and withdrawn.  The patient's vital signs and  pulse oximeter remained stable.  The patient tolerated the procedure well  without apparent complications.   FINDINGS:  Diverticulosis of the sigmoid colon, internal hemorrhoids,  otherwise, unremarkable exam.   PLAN:  See endoscopy note for further details.                                               Georgiana Spinner, M.D.    GMO/MEDQ  D:  08/25/2003  T:  08/25/2003  Job:  [id number]

## 2010-08-03 NOTE — Procedures (Signed)
[facility]  Patient:    RAINA, SOLE Visit Number: [id number] MRN: [id number]          Service Type: END Location: ENDO Attending Physician:  Sabino Gasser Dictated by:   Sabino Gasser, M.D. Proc. Date: 07/10/01 Admit Date:  07/10/2001                             Procedure Report  PROCEDURE:  Colonoscopy.  INDICATIONS:  Colon cancer screening. Colon polyps.  ANESTHESIA:  Demerol 20 mg, Versed 2 mg.  DESCRIPTION OF PROCEDURE:  With the patient mildly sedated in the left lateral decubitus position, the Olympus videoscopic pediatric colonoscope was inserted in the rectum and passed under direct vision into the cecum identified by the ileocecal valve and appendiceal orifice. From this point, the colonoscope was slowly withdrawn taking circumferential views of the entire colonic mucosa, stopping first in the ascending colon where a small polyp was seen, photographed and removed using hot biopsy forceps technique, setting of 20/20 blended current. A second larger polyp that again was sessile, approximately 2 cm in size, was also seen, photographed and then removed using hot biopsy forceps technique, setting of 20/20 blended current, and there was some bleeding at this point. Our intent was to use ERBE to eradicate the remainder of the polyp, which is what we did and there was cessation of bleeding but the polyp base was then injected with epinephrine 2 cc in two separate aliquots, and then the endoscope was withdrawn all the way to the rectum taking circumferential views of the remaining colonic mucosa which appeared normal on direct and showed normal in retroflex view of the anal canal. The patients vital signs and pulse oximeter remained stable. The patient tolerated the procedure well without apparent complications.  FINDINGS:  Small polyp of ascending colon, larger polyp of proximal transverse colon which showed transillumination in the right upper  quadrant of the abdomen. Both removed using hot biopsy forceps technique; the latter also was eradicated using ERBE Argon Photocoagulator. Some bleeding was noted and injection with epinephrine also done at this point.  PLAN:  Await biopsy report. Avoid aspirin for two weeks _______ and have patient on low residue diet for two weeks. Have patient call Dr. Virginia Rochester for results of biopsy and follow up with Dr. Virginia Rochester as an outpatient. Dictated by:   Sabino Gasser, M.D. Attending Physician:  Sabino Gasser DD:  07/10/01 TD:  07/10/01 Job: [id number]

## 2010-08-03 NOTE — H&P (Signed)
[name], [name]              ACCOUNT NO.:  [id number]   MEDICAL RECORD NO.:  [id number]          PATIENT TYPE:  INP   LOCATION:  NA                           FACILITY:  [facility]   PHYSICIAN:  Georges Lynch. Gioffre, M.D.DATE OF BIRTH:  Oct 18, 1915   DATE OF ADMISSION:  DATE OF DISCHARGE:                                HISTORY & PHYSICAL   HISTORY:  The patient has right hip and right knee pain. She fell outside at  [facility] approximately 1 month ago. She was seen in our office and  evaluated and x-ray revealed that her poly liner is disrupted in her right  total hip. The wire is broken. The patient will be admitted for revision of  her cup versus just replacement of the poly liner.   ALLERGIES:  PENICILLIN.   PRIMARY CARE PHYSICIAN:  Juline Patch, M.D.   PAST MEDICAL HISTORY:  Significant for gastroesophageal reflux disease,  degenerative arthritis, hypertension and anemia.   CURRENT MEDICATIONS:  1.  Lasix 20 mg daily.  2.  Prilosec 20 mg daily.  3.  Ferrous sulfate 325 mg daily.  4.  Restoril 30 mg h.s.   PAST SURGICAL HISTORY:  The patient had an appendectomy in 1929, C section  in 1948, cholecystectomy in 1966. Arthroscopic knee surgery in 1994 and  right total hip replacement in 1997.   FAMILY HISTORY:  Mother with hypertension and grandmother with diabetes.   REVIEW OF SYMPTOMS:  GENERAL:  Denies weight change, fever, chills, fatigue.  HEENT:  Denies headache, visual change, tinnitus, hearing loss, sore throat.  CARDIOVASCULAR:  Denies chest pain, palpitations, shortness of breath,  orthopnea. PULMONARY:  Denies dyspnea, wheezing, coughing, sputum  production, hemoptysis. GI:  Denies nausea, vomiting, hematemesis or  abdominal pain. GU:  Denies dysuria, frequency, urgency, hematuria.  ENDOCRINE:  Denies polyuria, polydipsia, appetite change, heat or cold  intolerance. MUSCULOSKELETAL:  The patient has painful ambulation of her  right hip. NEUROLOGIC:  Denies dizziness,  vertigo, syncope, seizure. SKIN:  Denies itching, rashes, masses or moles.   PHYSICAL EXAMINATION:  VITAL SIGNS:  Temperature is 98.9, pulse is 78,  respirations 16, blood pressure 120/70.  GENERAL:  An 77 year old female in no acute distress.  MENTAL STATUS:  She is alert and oriented and she is ambulating with a  walker.  HEENT:  PERRL. EOMs intact. Pharynx clear.  NECK:  Supple without masses.  CHEST:  Clear to auscultation bilaterally. No wheezing, rales or rhonchi  noted.  HEART:  Regular rate and rhythm without murmur.  ABDOMEN:  Positive bowel sounds, soft, nontender.  EXTREMITIES:  Examination of her right hip reveals painful motion of her  hip. She has about a 1/2 inch shortening of her right lower extremity when  compared to the left. Skin is warm and dry. X-rays show that she has  disruption of the poly liner, the wire is broken in her right total hip.   IMPRESSION:  Disruption of the poly liner, the wire is broken in her right  total hip.   PLAN:  The patient is to be admitted to [facility] for revision  of  the cup versus replacement of the poly liner on November 15, 2004.      Ebbie Ridge. Paitsel, P.A.    ______________________________  Georges Lynch Darrelyn Hillock, M.D.    Tilden Dome  D:  11/09/2004  T:  11/09/2004  Job:  [id number]

## 2010-08-03 NOTE — Discharge Summary (Signed)
[name], [name]              ACCOUNT NO.:  [id number]   MEDICAL RECORD NO.:  [id number]          PATIENT TYPE:  ORB   LOCATION:  4522                         FACILITY:  [facility]   PHYSICIAN:  Erick Colace, M.D.DATE OF BIRTH:  [birth year]   DATE OF ADMISSION:  11/19/2004  DATE OF DISCHARGE:  12/03/2004                                 DISCHARGE SUMMARY   DISCHARGE DIAGNOSES:  1.  Right hip polyliner disruption and breakage requiring right total hip      replacement revision.  2.  Peripheral edema.  3.  Hypertension.  4.  Situational depression.  5.  Gastroesophageal reflux disease.   HISTORY OF PRESENT ILLNESS:  Ms. Reeser is an 77 year old female with  history of right total hip replacement, recent falls, last one month prior  to admission on November 15, 2004 with right hip pain, secondary to disruption  of polyliner right hip.  The patient underwent right total hip replacement  revision November 15, 2004 by Dr. Cornelius Moras.  Postoperative is partial weight-  bearing and on Coumadin for DVT prophylaxis, INR currently at 1.6.  She was  also noted to have issues with acute blood loss anemia as well as  hyponatremia, post surgery her sodium dropping to 129.  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  Set up assist upper body care, mod to  max assist for lower body care.  [facility] was consulted for further therapies.   PAST MEDICAL HISTORY:  Significant for cholecystectomy, varicose vein  stripping, appendectomy, excision cataracts, right total hip replacement  1997, multiple falls recently, short-term memory deficit, anemia, colon  polyps, and recent UTI.   ALLERGIES:  PENICILLIN.   FAMILY HISTORY:  Positive for diabetes.   SOCIAL HISTORY:  The patient lives in [facility], was  using a cane occasionally for ambulation.  She continues to drive currently.  She does not use any tobacco,  uses alcohol occasionally.   HOSPITAL COURSE:  Ms. Anderson Coppock was admitted to subacute on November 19, 2004 with [facility]ACU level of therapies to consist of PT/OT daily.  Past  admission she was maintained on Coumadin for DVT prophylaxis.  She is to  continue on this through December 15, 2004 to complete DVT prophylaxis  course.  The patient's hip incision was monitored along, this has been  healing well without any signs or symptoms of infection, no drainage, no  erythema noted.  Her postop anemia was followed along, last check on  November 26, 2004 revealed hemoglobin 10.6, hematocrit 31.0, white count  6.6, platelets 336.  Check of lytes of November 30, 2004 revealed sodium  137, potassium 3.9, chloride 100, CO2 32, BUN 12, creatinine 0.7, glucose  109.  Some elevation in LFTs noted with AST 55, ALT 80.  A followup UA/UC  was done past admission showing 10,000 colonies yeast, she was taken off  Cipro.  The patient's blood pressures were noted to be on the low side and  the patient was complaining of weakness.  Therefore Lasix was held  for  hypotension.  She did have increase in peripheral edema once diuretics were  held.  This was resumed.  The patient did continue wearing support stockings  as well as continued on diuretics to help with edema control.   The patient's hip incision has healed well.  Staple were removed and Steri-  Strips applied on December 03, 2004 prior to discharge.  The patient has  progressed along well during her stay.  She is currently modified  independent for ADLs with the use of assistive equipment.  She is modified  independent for transfers, modified independent for ambulating 250 feet with  a rolling walker.  She was noted to have some problems assisting her right  lower extremity off bed and is currently using a leg loop to help assist  with this.  She will continue to receive further followup home health, PT/OT  by [facility] past  discharge.  Home health RN has been  arranged to draw pro times next on December 06, 2004.  On December 03, 2004 the patient is discharged to home.   DISCHARGE MEDICATIONS:  1.  Coumadin 2 mg 1/2 p.o. q.p.m.  2.  Paxil 20 mg daily.  3.  Ferrous sulfate 325 mg b.i.d.  4.  Protonix 40 mg daily.  5.  Calcium t.i.d.  6.  Vitamin C 500 mg daily.  7.  Lasix 20 mg daily.  8.  OxyIR 5-10 mg q.4-6h. p.r.n. pain.  9.  Robaxin 500 mg q.i.d. p.r.n. spasms.   ACTIVITY:  Level is as tolerated with the use of walker, partial weight-  bearing right lower extremity, follow right total hip precautions.   DIET:  Low salt.   WOUND CARE:  Wash the area with soap and water, keep clean and dry.  The  patient instructed to wear support stockings when out of bed.  [facility] required PT/OT and RN.   FOLLOWUP:  The patient to follow-up with Dr. Darrelyn Hillock in two weeks, follow-up  with Dr. Ricki Miller for routine check, follow-up with Dr. Wynn Banker as needed.      Greg Cutter, P.A.      Erick Colace, M.D.  Electronically Signed    PP/MEDQ  D:  12/03/2004  T:  12/04/2004  Job:  [id number]   cc:   Juline Patch, M.D.  Fax: [phone]   Georges Lynch. Darrelyn Hillock, M.D.  Fax: [phone]

## 2010-08-03 NOTE — Op Note (Signed)
[name], [name]              ACCOUNT NO.:  [id number]   MEDICAL RECORD NO.:  [id number]          PATIENT TYPE:  INP   LOCATION:  1507                         FACILITY:  [facility]   PHYSICIAN:  Madlyn Frankel. Charlann Boxer, M.D.  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   FINDINGS:  The patient was noted to have a fractured polyethylene liner with  extensive metallosis and metal debris within the knee. Importantly too that  one of the capturing knobs of the acetabular shell responsible for  maintaining plastic liner in position had eroded off and was not palpable.   PROCEDURE:  1.  Revision right total hip replacement with acetabular component, femoral      head.  2.  Extensive synovectomy with removal of metal debris.   SURGEON:  Madlyn Frankel. Charlann Boxer, M.D.   ASSISTANT:  Georges Lynch. Darrelyn Hillock, M.D.   ANESTHESIA:  General.   ESTIMATED BLOOD LOSS:  450.   DRAINS:  Drains x1.   COMPLICATIONS:  None.   INDICATIONS FOR PROCEDURE:  Ms. Steury is a pleasant 77 year old female who  is a patient of Dr. Ernesta Amble. She had presented to his office within  the past month on the 16th of August presenting with complaints of  increasing right hip and groin pain. Radiographs had been reviewed and  compared to previous films indicating fractured acetabular liner with  displacement of a metal ring. Dr. Darrelyn Hillock asked to have a consultation with  me for assistance and definitive measures. The risks and benefits were  reviewed including the possibility of liner exchange versus acetabular shell  exchange based on the condition of the acetabular shell as outlined. We  reviewed the risks and benefits of  general hip replacement surgery, consent  would be obtained.   DESCRIPTION OF PROCEDURE:  The patient was brought to the operative theater.  Once adequate anesthesia and preoperative antibiotics, 1 gram of Ancef, were  administered, the patient was positioned in the left lateral decubitus  position with the right side up. The right lower extremity was then prepped  and draped in a sterile fashion. A portion of the previous incision was  excised and utilized. Sharp dissection was carried down to the iliotibial  band and gluteus maximus fascia. Upon entering this area, there was noted to  be extensive metallosis. Upon entering into the pseudocapsule of the hip, a  very large metal debris filled effusion was evacuated. Exposure was then  obtained at this point including extensive synovectomy including medial  calcar, posterior capsule and anteriorly. Once exposure was obtained, large  fragments of the polyethylene liner were removed in this process. The hip  was dislocated, femoral head removed. At this point, acetabular exposure was  obtained with displacement of femoral component anteriorly and laterally  with retractors placed. The polyethylene liner was removed easily.  Inspection of the acetabular shell at this point revealed that one of these  tines that holds the cups in position had been eroded away. This was the one  in the superolateral position at the 10/11 o'clock. Based on this, I did not  feel confident enough that replacing this with a new liner by itself would  provide adequate and secure fixation. For this reason, a decision was made  to remove the acetabular shell. Utilizing the Zimmer explant system, the  acetabular shell was removed. Very little bone was removed other than in the  anterior medial aspect but this was only a few millimeters in depth. Note  that three cancellous screws had been removed as well to facilitate this  process. The patient's medial wall was noted  to be penetrated previously  with a dime size hole, this was not from removal of bone from the cup via  direct inspection. At this point, I initially reamed to a 46, then a 48 and  then a 50 mm reamer and impacted into position a 52 mm Trident hemispherical  cup with multi holes. This had a good scratch fit. It was supplemented with  two cancellous screws. Final cup position was about 35-40 degrees of  abduction, 15 degrees of anteversion. It was beneath the anterior lip and  rim. At this point, inspection of the femur initially revealed that the  femoral component was secure and this was expected based on the cement views  radiographically. Unfortunately, the femoral component was in a very neutral  position with very little anteversion. The combined anteversion of the  system was minimal. The previous liner removed was a 20 degree high wall  liner. I initially tried to utilize this. The hip tolerated only about 30  degrees of internal rotation and was felt to be impending anteriorly along  the anterior column of the pelvis Otherwise it was stable in extension and  external rotation with no concerns for impingement there. It was also  stable.  With shuck, leg lengths appeared equal. Based on this trial, I then  trialed an eccentric liner with plus 6 offset. This allowed for much more  clearance anteriorly and allowed for internal rotation to 60-70 degrees.  Given this, I chose a spinal liner. This only had a 10 degree high wall  liner but the hip was much more stable. The final series EX3 polyethylene  liner eccentric base with a plus 6 offset and 10 degree high wall liner was  then impacted into the cup without complicating features. A final 32 plus 5  ball was then impacted onto a clean and dried trunnion. At this point, the  hip was copiously, as it had been throughout the case, irrigated with normal saline solution and even pulse lavage at this point. Further debridement of  metallosis  and synovitis was removed as needed. The wound was closed in  layers with #1 Ethibond on the iliotibial band and #1 running Vicryl on the  gluteus maximus fascia. The rest of the wound was closed in layers with  staples on the skin. The hip was cleaned, dried and dressed sterilely with  Adaptic dressing sponge tape. The patient was extubated and transferred to  the recovery room in a knee immobilizer.   Please note that Dr. Darrelyn Hillock did perform a cortisone injection that is  outlined on the patient's consent as this was reviewed via their office.  This was due to arthritis in the knee.  Madlyn Frankel Charlann Boxer, M.D.  Electronically Signed     MDO/MEDQ  D:  11/16/2004  T:  11/16/2004  Job:  [id number]

## 2010-08-03 NOTE — Consult Note (Signed)
NAMEKETARA, CAVNESS              ACCOUNT NO.:  [id number]   MEDICAL RECORD NO.:  [id number]          PATIENT TYPE:  REC   LOCATION:  FOOT                         FACILITY:  [facility]   PHYSICIAN:  Theresia Majors. Tanda Rockers, M.D.DATE OF BIRTH:  03/25/1915   DATE OF CONSULTATION:  10/07/2005  DATE OF DISCHARGE:                                   CONSULTATION   REASON FOR CONSULTATION:  Ms. Schelling is an 77 year old female who was  referred by Dr. Ricki Miller for evaluation of a laceration and contusion of the  left lower extremity.   IMPRESSION:  Resolved laceration with resolving contusion of the lower  extremity.   RECOMMENDATIONS:  The patient has concurrent stasis and we are recommending  that she procure an wear HiFi 20-30 mm compression hose.   SUBJECTIVE:  This 77 year old lady sustained a blunt injury with laceration  to the anterior shin of the left lower extremity approximately 2 weeks ago  while visiting in [place].  She was not evaluated at [facility] but was  seen in the [facility] emergency room and apparently underwent a laceration repair  and was followed up by Dr. Ricki Miller.  She had the sutures removed and continued  to elevate her legs at home and has experienced some resolution of the  associated bruises.  She denies fever.  She remains ambulatory but is  limited in her activity due to swelling.   PAST MEDICAL HISTORY:  Remarkable for a remote myocardial infarction,  gastroesophageal reflux disease, osteoporosis, chronic venous insufficiency  and she had a history of deep vein thrombosis.  Her past surgery has  included an appendectomy, cholecystectomy, a vein stripping approximately 8  years ago, and arthroscopy of the right knee.   ALLERGIES:  PENICILLIN.   CURRENT MEDICATIONS:  Lasix 40 mg b.i.d., potassium chloride 200 mEq b.i.d.,  enteric coated aspirin 81 mg daily.  She is also on an unknown  antidepressant, vitamin C, and multivitamin over-the-counter, calcium plus  vitamin D  over-the-counter 500 mg and a sleeping pill.   FAMILY HISTORY:  Positive for diabetes, vascular disease including heart  attack and stroke.   SOCIAL HISTORY:  She is a widow.  She has 4 children, 2 locally and 2  remotely.   REVIEW OF SYSTEMS:  Reviews that she specifically denies angina pectoris,  visual changes or muscle extremity weaknesses consistent with TIAs.  She has  no bowel or bladder dysfunction.  Her weight has been stable.  She denies  paroxysms or shortness of breath.  The remainder of the review of systems is  negative.   PHYSICAL EXAMINATION:  She is an alert, oriented elderly pleasant female,  responding appropriately and in good contact with reality.  Her blood  pressure is 128/70, respirations are 20, pulse rate of 80 and she is  afebrile.  HEENT:  Clear.  NECK:  Supple, trachea is midline, thyroid is nonpalpable.  BREAST EXAM:  Deferred.  HEART SOUNDS:  Distant.  ABDOMEN:  Soft.  EXTREMITY EXAM:  Remarkable for bilateral 2+ edema.  There are fresh  ecchymoses on the anterior shin of the right lower extremity  as well as the  left lower extremity.  On the left lower extremity the evidence of the  previous laceration and mature, well-adherent eschars are evident.  There is  a bounding 3+ dorsalis pedis pulse on the left.  There is a 2+ pulse on the  right.  NEUROLOGICALLY:  Protective sensation is retained.   DISCUSSION:  This 77 year old female has healed a laceration reasonably well  over 2 weeks.  She has persistence of ecchymotic areas in both lower  extremities with changes of chronic stasis.  We have recommended that she  wear a support hose, compromising from a recommended 30-40 to a 20-30 due to  the anticipated difficulty on putting the stocking on.  The patient is  resistant to wearing a below-the-knee stocking and has requested that she  wear panty hose.  I have suggested that she wear a HiFi support hose  supported by a belt.  We have written her a  prescription for the same.  As  she does not have open wounds at the moment, we are recommending that she  continue to elevate her legs at home and to postpone excursions until she  has procured these recommended support garments.  The patient seems to  understand.  We will discharge her and we will reevaluate her on a p.r.n.  basis.   ADMISSION LABORATORIES:   ASSESSMENT AND PLAN:           ______________________________  Theresia Majors. Tanda Rockers, M.D.     Cephus Slater  D:  10/07/2005  T:  10/07/2005  Job:  [id number]   cc:   Juline Patch, M.D.  Fax: [phone]

## 2010-09-14 ENCOUNTER — Encounter (HOSPITAL_COMMUNITY): Payer: Self-pay | Admitting: Radiology

## 2010-09-14 ENCOUNTER — Emergency Department (HOSPITAL_COMMUNITY)
Admission: EM | Admit: 2010-09-14 | Discharge: 2010-09-14 | Disposition: A | Discharge status: Home / Self Care | Payer: Medicare Other | Attending: Emergency Medicine | Admitting: Emergency Medicine

## 2010-09-14 ENCOUNTER — Emergency Department (HOSPITAL_COMMUNITY): Payer: Medicare Other

## 2010-09-14 DIAGNOSIS — W010XXA Fall on same level from slipping, tripping and stumbling without subsequent striking against object, initial encounter: Secondary | ICD-10-CM | POA: Insufficient documentation

## 2010-09-14 DIAGNOSIS — S0003XA Contusion of scalp, initial encounter: Secondary | ICD-10-CM | POA: Insufficient documentation

## 2010-09-14 DIAGNOSIS — F329 Major depressive disorder, single episode, unspecified: Secondary | ICD-10-CM | POA: Insufficient documentation

## 2010-09-14 DIAGNOSIS — F3289 Other specified depressive episodes: Secondary | ICD-10-CM | POA: Insufficient documentation

## 2010-09-14 DIAGNOSIS — K219 Gastro-esophageal reflux disease without esophagitis: Secondary | ICD-10-CM | POA: Insufficient documentation

## 2010-09-14 DIAGNOSIS — Y92009 Unspecified place in unspecified non-institutional (private) residence as the place of occurrence of the external cause: Secondary | ICD-10-CM | POA: Insufficient documentation

## 2010-09-14 DIAGNOSIS — S022XXA Fracture of nasal bones, initial encounter for closed fracture: Secondary | ICD-10-CM | POA: Insufficient documentation

## 2010-09-14 DIAGNOSIS — R040 Epistaxis: Secondary | ICD-10-CM | POA: Insufficient documentation

## 2010-09-14 DIAGNOSIS — S0083XA Contusion of other part of head, initial encounter: Secondary | ICD-10-CM | POA: Insufficient documentation

## 2010-09-14 DIAGNOSIS — M542 Cervicalgia: Secondary | ICD-10-CM | POA: Insufficient documentation

## 2010-09-14 DIAGNOSIS — R51 Headache: Secondary | ICD-10-CM | POA: Insufficient documentation

## 2011-07-08 ENCOUNTER — Other Ambulatory Visit (HOSPITAL_COMMUNITY): Payer: Self-pay | Admitting: Orthopedic Surgery

## 2011-07-08 DIAGNOSIS — M549 Dorsalgia, unspecified: Secondary | ICD-10-CM

## 2011-07-08 DIAGNOSIS — M25552 Pain in left hip: Secondary | ICD-10-CM

## 2011-07-08 DIAGNOSIS — M79604 Pain in right leg: Secondary | ICD-10-CM

## 2011-07-16 ENCOUNTER — Other Ambulatory Visit (HOSPITAL_COMMUNITY): Payer: Self-pay | Admitting: Orthopedic Surgery

## 2011-07-16 ENCOUNTER — Encounter (HOSPITAL_COMMUNITY)
Admission: RE | Admit: 2011-07-16 | Discharge: 2011-07-16 | Disposition: A | Discharge status: Home / Self Care | Payer: Medicare Other | Source: Ambulatory Visit | Attending: Orthopedic Surgery | Admitting: Orthopedic Surgery

## 2011-07-16 DIAGNOSIS — M79604 Pain in right leg: Secondary | ICD-10-CM

## 2011-07-16 DIAGNOSIS — M79609 Pain in unspecified limb: Secondary | ICD-10-CM | POA: Insufficient documentation

## 2011-07-16 DIAGNOSIS — M545 Low back pain, unspecified: Secondary | ICD-10-CM | POA: Insufficient documentation

## 2011-07-16 DIAGNOSIS — M25552 Pain in left hip: Secondary | ICD-10-CM

## 2011-07-16 DIAGNOSIS — M79605 Pain in left leg: Secondary | ICD-10-CM

## 2011-07-16 DIAGNOSIS — M25559 Pain in unspecified hip: Secondary | ICD-10-CM | POA: Insufficient documentation

## 2011-07-16 DIAGNOSIS — M549 Dorsalgia, unspecified: Secondary | ICD-10-CM

## 2011-07-16 DIAGNOSIS — Z96649 Presence of unspecified artificial hip joint: Secondary | ICD-10-CM | POA: Insufficient documentation

## 2011-07-16 MED ORDER — TECHNETIUM TC 99M MEDRONATE IV KIT
24.0000 | PACK | Freq: Once | INTRAVENOUS | Status: AC | PRN
  Administered 2011-07-16: 24 via INTRAVENOUS

## 2012-06-27 ENCOUNTER — Emergency Department (HOSPITAL_COMMUNITY)
Admission: EM | Admit: 2012-06-27 | Discharge: 2012-06-27 | Disposition: A | Discharge status: Home Health | Payer: Medicare Other | Attending: Emergency Medicine | Admitting: Emergency Medicine

## 2012-06-27 ENCOUNTER — Encounter (HOSPITAL_COMMUNITY): Payer: Self-pay | Admitting: Family Medicine

## 2012-06-27 ENCOUNTER — Emergency Department (HOSPITAL_COMMUNITY): Payer: Medicare Other

## 2012-06-27 DIAGNOSIS — T148XXA Other injury of unspecified body region, initial encounter: Secondary | ICD-10-CM

## 2012-06-27 DIAGNOSIS — M129 Arthropathy, unspecified: Secondary | ICD-10-CM | POA: Insufficient documentation

## 2012-06-27 DIAGNOSIS — S51812A Laceration without foreign body of left forearm, initial encounter: Secondary | ICD-10-CM

## 2012-06-27 DIAGNOSIS — R51 Headache: Secondary | ICD-10-CM | POA: Insufficient documentation

## 2012-06-27 DIAGNOSIS — Y9301 Activity, walking, marching and hiking: Secondary | ICD-10-CM | POA: Insufficient documentation

## 2012-06-27 DIAGNOSIS — W010XXA Fall on same level from slipping, tripping and stumbling without subsequent striking against object, initial encounter: Secondary | ICD-10-CM | POA: Insufficient documentation

## 2012-06-27 DIAGNOSIS — S5010XA Contusion of unspecified forearm, initial encounter: Secondary | ICD-10-CM | POA: Insufficient documentation

## 2012-06-27 DIAGNOSIS — IMO0002 Reserved for concepts with insufficient information to code with codable children: Secondary | ICD-10-CM | POA: Insufficient documentation

## 2012-06-27 DIAGNOSIS — S0990XA Unspecified injury of head, initial encounter: Secondary | ICD-10-CM | POA: Insufficient documentation

## 2012-06-27 DIAGNOSIS — Z79899 Other long term (current) drug therapy: Secondary | ICD-10-CM | POA: Insufficient documentation

## 2012-06-27 DIAGNOSIS — Y9210 Unspecified residential institution as the place of occurrence of the external cause: Secondary | ICD-10-CM | POA: Insufficient documentation

## 2012-06-27 HISTORY — DX: Unspecified osteoarthritis, unspecified site: M19.90

## 2012-06-27 LAB — CBC WITH DIFFERENTIAL/PLATELET
HCT: 35 % — ABNORMAL LOW (ref 36.0–46.0)
Hemoglobin: 11.8 g/dL — ABNORMAL LOW (ref 12.0–15.0)
Lymphocytes Relative: 28 % (ref 12–46)
Lymphs Abs: 1.7 10*3/uL (ref 0.7–4.0)
MCH: 33.2 pg (ref 26.0–34.0)
Neutro Abs: 3.2 10*3/uL (ref 1.7–7.7)
Neutrophils Relative %: 55 % (ref 43–77)
Platelets: 152 10*3/uL (ref 150–400)

## 2012-06-27 LAB — BASIC METABOLIC PANEL
CO2: 30 mEq/L (ref 19–32)
Chloride: 103 mEq/L (ref 96–112)
Glucose, Bld: 127 mg/dL — ABNORMAL HIGH (ref 70–99)
Potassium: 4 mEq/L (ref 3.5–5.1)
Sodium: 140 mEq/L (ref 135–145)

## 2012-06-27 LAB — PROTIME-INR: INR: 1.03 (ref 0.00–1.49)

## 2012-06-27 NOTE — ED Provider Notes (Addendum)
History     CSN: [id number]  Arrival date & time 06/27/12  1229   First MD Initiated Contact with Patient 06/27/12 1237      Chief Complaint  Patient presents with  . Fall    (Consider location/radiation/quality/duration/timing/severity/associated sxs/prior treatment) HPI Comments: Patient comes to the ER for evaluation after a fall. Patient reports that she has been having trouble walking because of stiffness and pain in her left leg. She has fallen a couple of times in recent weeks. [facility] a [facility] community currently. Patient reports that she fell backwards, hit the back of her head, complaints of headache. She also has a skin tear on her arm. She did not lose consciousness. Patient denies chest pain, heart palpitations and shortness of breath. The fall was caused by having difficulty walking, she did not pass out.  Patient is a 79 y.o. female presenting with fall.  Fall Associated symptoms include headaches.    Past Medical History  Diagnosis Date  . Arthritis     History reviewed. No pertinent past surgical history.  History reviewed. No pertinent family history.  History  Substance Use Topics  . Smoking status: Never Smoker   . Smokeless tobacco: Not on file  . Alcohol Use: No    OB History   Grav Para Term Preterm Abortions TAB SAB Ect Mult Living                  Review of Systems  HENT: Negative for neck pain.   Musculoskeletal: Negative for back pain.  Skin: Positive for wound.  Neurological: Positive for headaches. Negative for dizziness.  All other systems reviewed and are negative.    Allergies  Penicillins  Home Medications   Current Outpatient Rx  Name  Route  Sig  Dispense  Refill  . CALCIUM PO   Oral   Take 1 tablet by mouth daily.         . furosemide (LASIX) 40 MG tablet   Oral   Take 40 mg by mouth daily.         . Multiple Vitamin (MULTIVITAMIN WITH MINERALS) TABS   Oral   Take 1 tablet by mouth daily.          Marland Kitchen omeprazole (PRILOSEC) 40 MG capsule   Oral   Take 40 mg by mouth daily.         . potassium chloride (K-DUR) 10 MEQ tablet   Oral   Take 10 mEq by mouth daily.         . sertraline (ZOLOFT) 50 MG tablet   Oral   Take 50 mg by mouth daily.         . triazolam (HALCION) 0.25 MG tablet   Oral   Take 0.25 mg by mouth at bedtime as needed.           BP 140/59  Pulse 74  Temp(Src) 97.6 F (36.4 C) (Oral)  Resp 18  SpO2 93%  Physical Exam  Constitutional: She is oriented to person, place, and time. She appears well-developed and well-nourished. No distress.  HENT:  Head: Normocephalic and atraumatic.    Right Ear: Hearing normal.  Nose: Nose normal.  Mouth/Throat: Oropharynx is clear and moist and mucous membranes are normal.  Eyes: Conjunctivae and EOM are normal. Pupils are equal, round, and reactive to light.  Neck: Normal range of motion. Neck supple.  Cardiovascular: Normal rate, regular rhythm, S1 normal and S2 normal.  Exam reveals no gallop and  no friction rub.   No murmur heard. Pulmonary/Chest: Effort normal and breath sounds normal. No respiratory distress. She exhibits no tenderness.  Abdominal: Soft. Normal appearance and bowel sounds are normal. There is no hepatosplenomegaly. There is no tenderness. There is no rebound, no guarding, no tenderness at McBurney's point and negative Murphy's sign. No hernia.  Musculoskeletal: Normal range of motion.       Arms: Neurological: She is alert and oriented to person, place, and time. She has normal strength. No cranial nerve deficit or sensory deficit. Coordination normal. GCS eye subscore is 4. GCS verbal subscore is 5. GCS motor subscore is 6.  Skin: Skin is warm, dry and intact. No rash noted. No cyanosis.  Psychiatric: She has a normal mood and affect. Her speech is normal and behavior is normal. Thought content normal.    ED Course  Procedures (including critical care time)  Labs Reviewed  CBC WITH  DIFFERENTIAL - Abnormal; Notable for the following:    RBC 3.55 (*)    Hemoglobin 11.8 (*)    HCT 35.0 (*)    Eosinophils Relative 6 (*)    All other components within normal limits  BASIC METABOLIC PANEL - Abnormal; Notable for the following:    Glucose, Bld 127 (*)    BUN 26 (*)    GFR calc non Af Amer 56 (*)    GFR calc Af Amer 65 (*)    All other components within normal limits  PROTIME-INR   Ct Head Wo Contrast  06/27/2012  *RADIOLOGY REPORT*  Clinical Data: Larey Seat and hit head.  Left-sided weakness.  CT HEAD WITHOUT CONTRAST  Technique:  Contiguous axial images were obtained from the base of the skull through the vertex without contrast.  Comparison:  CT head 09/14/2010.  Findings:  No acute stroke or hemorrhage.  No mass lesion or hydrocephalus.  Moderate atrophy not unexpected for age 49. Chronic microvascular ischemic change affects the periventricular subcortical white matter. Slight asymmetric thickening of the right fornix unchanged from 2012.  The calvarium is intact.  There is a right parietal scalp hematoma without underlying skull fracture or radiopaque foreign body.  No contrecoup injury.  Tentorial calcification without subdural hematoma.  No sinus or mastoid disease.  IMPRESSION: Stable chronic findings.  Right scalp hematoma without underlying skull fracture or intracranial hemorrhage.   Original Report Authenticated By: Davonna Belling, M.D.      Diagnosis: Contusion and skin tear secondary to fall    MDM  Patient presents to the ER for evaluation after a fall. There was no loss of consciousness. Patient neurologic examination is normal. No focal deficits. She reports that she is having trouble ambulating because her left leg is stiff and she is having trouble moving it. This is been going on for weeks. She is able to move both legs against gravity equally. Grip strength is normal. She has no focal neurologic deficit. Blood work was normal. CT head shows no evidence of infarct  and no evidence of injury from the fall. This will likely require physical therapy. Case management being contacted for evaluation to see if she qualifies for more assistance. She is currently in independent living, might even require assisted living.       Gilda Crease, MD 06/27/12 1634  Gilda Crease, MD 06/27/12 [phone]

## 2012-06-27 NOTE — ED Notes (Signed)
Diet ordered for pt

## 2012-06-27 NOTE — ED Notes (Signed)
Per EMS, pt had a fall. 1 inch lac to the back of her head and skin tear to arm. No syncope or LOC. No blood thinners. Pt DNR BP 138/70. Pulse 74 RR 16

## 2012-06-27 NOTE — ED Notes (Signed)
Pt discharged to home with family. NAD.  

## 2012-06-27 NOTE — Care Management Note (Signed)
Cm spoke with ER MD concerning Cm consult for [facility] services for patient. CM spoke with patient via telephone concerning discharge planning. Pt A&Ox3, Per pt currently living at [facility]. Pt offered choice for [facility] services. Per pt choice [facility] to provide [facility] services upon discharge. CM informed MD suggest PT/RN/SW upon discharge. [facility] notified of referral. Facesheet, Md orders, H/P faxed to [facility] at [phone]. Pt's son Bailei Buist present at bedside to provide tx home. Pt states having two RW for home DME use. No other needs stated.   Roxy Manns Kourtlyn Charlet,RN,BSN [phone]

## 2012-12-11 ENCOUNTER — Inpatient Hospital Stay (HOSPITAL_COMMUNITY)
Admission: EM | Admit: 2012-12-11 | Discharge: 2012-12-17 | DRG: 481 | Disposition: A | Discharge status: Skilled Nursing Facility | Payer: Medicare Other | Attending: Orthopedic Surgery | Admitting: Orthopedic Surgery

## 2012-12-11 ENCOUNTER — Encounter (HOSPITAL_COMMUNITY): Payer: Self-pay

## 2012-12-11 ENCOUNTER — Encounter (HOSPITAL_BASED_OUTPATIENT_CLINIC_OR_DEPARTMENT_OTHER): Payer: Medicare Other | Attending: General Surgery

## 2012-12-11 DIAGNOSIS — S7292XA Unspecified fracture of left femur, initial encounter for closed fracture: Secondary | ICD-10-CM

## 2012-12-11 DIAGNOSIS — R109 Unspecified abdominal pain: Secondary | ICD-10-CM

## 2012-12-11 DIAGNOSIS — S72409A Unspecified fracture of lower end of unspecified femur, initial encounter for closed fracture: Principal | ICD-10-CM | POA: Diagnosis present

## 2012-12-11 DIAGNOSIS — D696 Thrombocytopenia, unspecified: Secondary | ICD-10-CM | POA: Diagnosis present

## 2012-12-11 DIAGNOSIS — Z96649 Presence of unspecified artificial hip joint: Secondary | ICD-10-CM

## 2012-12-11 DIAGNOSIS — D62 Acute posthemorrhagic anemia: Secondary | ICD-10-CM | POA: Diagnosis not present

## 2012-12-11 DIAGNOSIS — Z6828 Body mass index (BMI) 28.0-28.9, adult: Secondary | ICD-10-CM

## 2012-12-11 DIAGNOSIS — D500 Iron deficiency anemia secondary to blood loss (chronic): Secondary | ICD-10-CM | POA: Diagnosis present

## 2012-12-11 DIAGNOSIS — R600 Localized edema: Secondary | ICD-10-CM | POA: Diagnosis present

## 2012-12-11 DIAGNOSIS — E663 Overweight: Secondary | ICD-10-CM | POA: Diagnosis present

## 2012-12-11 DIAGNOSIS — N179 Acute kidney failure, unspecified: Secondary | ICD-10-CM | POA: Diagnosis not present

## 2012-12-11 DIAGNOSIS — I959 Hypotension, unspecified: Secondary | ICD-10-CM | POA: Diagnosis not present

## 2012-12-11 DIAGNOSIS — M810 Age-related osteoporosis without current pathological fracture: Secondary | ICD-10-CM | POA: Insufficient documentation

## 2012-12-11 DIAGNOSIS — X58XXXA Exposure to other specified factors, initial encounter: Secondary | ICD-10-CM | POA: Insufficient documentation

## 2012-12-11 DIAGNOSIS — S79919A Unspecified injury of unspecified hip, initial encounter: Secondary | ICD-10-CM | POA: Insufficient documentation

## 2012-12-11 DIAGNOSIS — I10 Essential (primary) hypertension: Secondary | ICD-10-CM | POA: Diagnosis present

## 2012-12-11 DIAGNOSIS — I2510 Atherosclerotic heart disease of native coronary artery without angina pectoris: Secondary | ICD-10-CM | POA: Insufficient documentation

## 2012-12-11 DIAGNOSIS — Y92009 Unspecified place in unspecified non-institutional (private) residence as the place of occurrence of the external cause: Secondary | ICD-10-CM

## 2012-12-11 DIAGNOSIS — S7290XA Unspecified fracture of unspecified femur, initial encounter for closed fracture: Secondary | ICD-10-CM

## 2012-12-11 DIAGNOSIS — W010XXA Fall on same level from slipping, tripping and stumbling without subsequent striking against object, initial encounter: Secondary | ICD-10-CM | POA: Diagnosis present

## 2012-12-11 DIAGNOSIS — Z66 Do not resuscitate: Secondary | ICD-10-CM | POA: Diagnosis present

## 2012-12-11 DIAGNOSIS — D649 Anemia, unspecified: Secondary | ICD-10-CM | POA: Diagnosis present

## 2012-12-11 DIAGNOSIS — K219 Gastro-esophageal reflux disease without esophagitis: Secondary | ICD-10-CM

## 2012-12-11 DIAGNOSIS — E875 Hyperkalemia: Secondary | ICD-10-CM | POA: Diagnosis not present

## 2012-12-11 HISTORY — DX: Atherosclerotic heart disease of native coronary artery without angina pectoris: I25.10

## 2012-12-11 NOTE — ED Notes (Signed)
Per EMS, pt from home.  Pt fell going to bathroom. Slipped on floor.  No head injury/no loc.  Pt has deformed left leg. Leg braced.   Pt alert and oriented.  Pt given of fentanyl in route through 22 g L hand.  Hx  Arthritis, MI, 3 hip surgeries, vitals:  112/54, hr 70

## 2012-12-11 NOTE — ED Notes (Signed)
Bed: NF62 Expected date:  Expected time:  Means of arrival:  Comments: EMS, 79yo F, left leg injury w/ deformity / s/p fall

## 2012-12-11 NOTE — ED Notes (Signed)
Pt from assisted living.  [facility] facility

## 2012-12-12 ENCOUNTER — Emergency Department (HOSPITAL_COMMUNITY): Payer: Medicare Other

## 2012-12-12 ENCOUNTER — Encounter (HOSPITAL_COMMUNITY): Payer: Self-pay

## 2012-12-12 DIAGNOSIS — R609 Edema, unspecified: Secondary | ICD-10-CM

## 2012-12-12 DIAGNOSIS — R600 Localized edema: Secondary | ICD-10-CM | POA: Diagnosis present

## 2012-12-12 DIAGNOSIS — S7290XA Unspecified fracture of unspecified femur, initial encounter for closed fracture: Secondary | ICD-10-CM

## 2012-12-12 DIAGNOSIS — D649 Anemia, unspecified: Secondary | ICD-10-CM

## 2012-12-12 LAB — CBC WITH DIFFERENTIAL/PLATELET
Basophils Absolute: 0 10*3/uL (ref 0.0–0.1)
HCT: 35.5 % — ABNORMAL LOW (ref 36.0–46.0)
Hemoglobin: 11.5 g/dL — ABNORMAL LOW (ref 12.0–15.0)
Lymphocytes Relative: 42 % (ref 12–46)
Lymphs Abs: 2.6 10*3/uL (ref 0.7–4.0)
MCV: 103.2 fL — ABNORMAL HIGH (ref 78.0–100.0)
Monocytes Absolute: 0.4 10*3/uL (ref 0.1–1.0)
Monocytes Relative: 7 % (ref 3–12)
Neutro Abs: 2.8 10*3/uL (ref 1.7–7.7)
RBC: 3.44 MIL/uL — ABNORMAL LOW (ref 3.87–5.11)
RDW: 14.4 % (ref 11.5–15.5)
WBC: 6.1 10*3/uL (ref 4.0–10.5)

## 2012-12-12 LAB — URINALYSIS, ROUTINE W REFLEX MICROSCOPIC
Bilirubin Urine: NEGATIVE
Glucose, UA: NEGATIVE mg/dL
Ketones, ur: NEGATIVE mg/dL
pH: 5.5 (ref 5.0–8.0)

## 2012-12-12 LAB — URINE MICROSCOPIC-ADD ON

## 2012-12-12 LAB — BASIC METABOLIC PANEL
CO2: 29 mEq/L (ref 19–32)
Chloride: 103 mEq/L (ref 96–112)
GFR calc Af Amer: 50 mL/min — ABNORMAL LOW (ref >90)
Potassium: 4.9 mEq/L (ref 3.5–5.1)
Sodium: 140 mEq/L (ref 135–145)

## 2012-12-12 MED ORDER — BISACODYL 10 MG RE SUPP: 10.0000 mg | Freq: Every day | RECTAL | Status: DC | PRN

## 2012-12-12 MED ORDER — ACETAMINOPHEN 650 MG RE SUPP: 650.0000 mg | Freq: Four times a day (QID) | RECTAL | Status: DC | PRN

## 2012-12-12 MED ORDER — TRIAZOLAM 0.125 MG PO TABS
0.2500 mg | ORAL_TABLET | Freq: Every evening | ORAL | Status: DC | PRN
  Administered 2012-12-13: 22:00:00 0.25 mg via ORAL
  Filled 2012-12-12: qty 2

## 2012-12-12 MED ORDER — SODIUM CHLORIDE 0.9 % IJ SOLN
3.0000 mL | Freq: Two times a day (BID) | INTRAMUSCULAR | Status: DC
  Administered 2012-12-13: 10:00:00 3 mL via INTRAVENOUS

## 2012-12-12 MED ORDER — ENOXAPARIN SODIUM 40 MG/0.4ML ~~LOC~~ SOLN
40.0000 mg | Freq: Once | SUBCUTANEOUS | Status: AC
  Administered 2012-12-12: 15:00:00 40 mg via SUBCUTANEOUS
  Filled 2012-12-12: qty 0.4

## 2012-12-12 MED ORDER — BIOTENE DRY MOUTH MT LIQD
15.0000 mL | Freq: Two times a day (BID) | OROMUCOSAL | Status: DC
  Administered 2012-12-14 – 2012-12-17 (×6): 15 mL via OROMUCOSAL

## 2012-12-12 MED ORDER — FENTANYL CITRATE 0.05 MG/ML IJ SOLN: 50.0000 ug | INTRAMUSCULAR | Status: DC | PRN

## 2012-12-12 MED ORDER — FLEET ENEMA 7-19 GM/118ML RE ENEM: 1.0000 | ENEMA | Freq: Once | RECTAL | Status: AC | PRN

## 2012-12-12 MED ORDER — SODIUM CHLORIDE 0.9 % IJ SOLN: 3.0000 mL | INTRAMUSCULAR | Status: DC | PRN

## 2012-12-12 MED ORDER — CHLORHEXIDINE GLUCONATE 0.12 % MT SOLN
15.0000 mL | Freq: Two times a day (BID) | OROMUCOSAL | Status: DC
  Administered 2012-12-14 – 2012-12-16 (×4): 15 mL via OROMUCOSAL
  Filled 2012-12-12 (×11): qty 15

## 2012-12-12 MED ORDER — SODIUM CHLORIDE 0.9 % IV SOLN
250.0000 mL | INTRAVENOUS | Status: DC | PRN
  Administered 2012-12-13: 14:00:00 via INTRAVENOUS

## 2012-12-12 MED ORDER — PANTOPRAZOLE SODIUM 40 MG PO TBEC
40.0000 mg | DELAYED_RELEASE_TABLET | Freq: Every day | ORAL | Status: DC
  Administered 2012-12-12 – 2012-12-17 (×5): 40 mg via ORAL
  Filled 2012-12-12 (×5): qty 1

## 2012-12-12 MED ORDER — POLYETHYLENE GLYCOL 3350 17 G PO PACK
17.0000 g | PACK | Freq: Every day | ORAL | Status: DC | PRN
  Administered 2012-12-13: 17 g via ORAL
  Filled 2012-12-12: qty 1

## 2012-12-12 MED ORDER — ONDANSETRON HCL 4 MG PO TABS: 4.0000 mg | ORAL_TABLET | Freq: Four times a day (QID) | ORAL | Status: DC | PRN

## 2012-12-12 MED ORDER — TRAMADOL HCL 50 MG PO TABS
50.0000 mg | ORAL_TABLET | Freq: Four times a day (QID) | ORAL | Status: DC
  Administered 2012-12-12 – 2012-12-16 (×8): 50 mg via ORAL
  Filled 2012-12-12 (×8): qty 1

## 2012-12-12 MED ORDER — SODIUM CHLORIDE 0.9 % IV SOLN
INTRAVENOUS | Status: DC
  Administered 2012-12-12 – 2012-12-13 (×3): via INTRAVENOUS

## 2012-12-12 MED ORDER — ACETAMINOPHEN 325 MG PO TABS: 650.0000 mg | ORAL_TABLET | Freq: Four times a day (QID) | ORAL | Status: DC | PRN

## 2012-12-12 MED ORDER — SERTRALINE HCL 50 MG PO TABS
50.0000 mg | ORAL_TABLET | Freq: Every day | ORAL | Status: DC
  Administered 2012-12-12 – 2012-12-17 (×6): 50 mg via ORAL
  Filled 2012-12-12 (×6): qty 1

## 2012-12-12 MED ORDER — OXYCODONE HCL 5 MG PO TABS
5.0000 mg | ORAL_TABLET | ORAL | Status: DC | PRN
  Administered 2012-12-12: 5 mg via ORAL
  Filled 2012-12-12: qty 1

## 2012-12-12 MED ORDER — HYDROMORPHONE HCL PF 1 MG/ML IJ SOLN: 0.5000 mg | INTRAMUSCULAR | Status: DC | PRN

## 2012-12-12 MED ORDER — ONDANSETRON HCL 4 MG/2ML IJ SOLN: 4.0000 mg | Freq: Four times a day (QID) | INTRAMUSCULAR | Status: DC | PRN

## 2012-12-12 NOTE — Progress Notes (Signed)
Consult FU Pt seen and examined, admitted with Distal femur fracture For OR tomorrow DVt proph: lovenox today Resume home meds, KVO IVF, hold lasix DNR  Zannie Cove, MD [phone]

## 2012-12-12 NOTE — ED Notes (Signed)
bil lower extremities, pink, warm with bounding left pulse and weaker pulse noted to uninjured leg on rt.  Pt has limited movement to left leg.  All digits moveable with little discomfort.  No decreased sensation.

## 2012-12-12 NOTE — Progress Notes (Signed)
   Subjective:     Patient admitted for comminuted distal left femur fracture in the presence of prior hip replacement Pt resting comfortably in no acute distress currently Knee immobilizer in place  Patient reports pain as mild.  Objective:   VITALS:   Filed Vitals:   12/12/12 0322  BP: 135/73  Pulse: 77  Temp: 98.4 F (36.9 C)  Resp: 20    Left lower extremity nv intact distally Remains in knee immobilizer  LABS  Recent Labs  12/12/12 0020  HGB 11.5*  HCT 35.5*  WBC 6.1  PLT 160     Recent Labs  12/12/12 0020  NA 140  K 4.9  BUN 32*  CREATININE 1.07  GLUCOSE 123*     Assessment/Plan:    Left distal femur fracture Plan for definitive ORIF tomorrow by Dr. Charlann Boxer NPO after midnight Strict non weight bearing left lower extremity dvt propylaxis   Alphonsa Overall, MPAS, PA-C  12/12/2012, 7:47 AM

## 2012-12-12 NOTE — ED Notes (Signed)
Brace applied by Dr. Juliene Pina and PA

## 2012-12-12 NOTE — ED Notes (Signed)
Unable to obtain cath.  Pt position very difficult.

## 2012-12-12 NOTE — ED Provider Notes (Signed)
CSN: [id number]     Arrival date & time 12/11/12  2348 History   First MD Initiated Contact with Patient 12/11/12 2352     Chief Complaint  Patient presents with  . Fall  . Leg Injury   (Consider location/radiation/quality/duration/timing/severity/associated sxs/prior Treatment) HPI 79 year old female presents to emergency department via EMS from home.  Patient lives with family.  Coming back from the bathroom, she slipped and fell.  No head injury.  No LOC.  No other injuries.  EMS reports upon their arrival, the left leg was significantly deformed.  They placed left leg in splint, and brought it back to an anatomic position.  They report the deformity was around the knee joint.  Patient reports pain in her left hip.  Has had prior left hip surgery.  Patient denies any syncope.  She received 200 mcg of fentanyl and route.  She denies any pain at this time.  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  "Due to stress."  Past Medical History  Diagnosis Date  . Arthritis   . Coronary artery disease    Past Surgical History  Procedure Laterality Date  . Hip fracture surgery      x 3   History reviewed. No pertinent family history. History  Substance Use Topics  . Smoking status: Never Smoker   . Smokeless tobacco: Not on file  . Alcohol Use: Yes   OB History   Grav Para Term Preterm Abortions TAB SAB Ect Mult Living                 Review of Systems  All other systems reviewed and are negative.    Allergies  Penicillins  Home Medications   Current Outpatient Rx  Name  Route  Sig  Dispense  Refill  . CALCIUM PO   Oral   Take 1 tablet by mouth daily.         . furosemide (LASIX) 40 MG tablet   Oral   Take 40 mg by mouth daily.         . Multiple Vitamin (MULTIVITAMIN WITH MINERALS) TABS   Oral   Take 1 tablet by mouth daily.         Marland Kitchen omeprazole (PRILOSEC) 40 MG capsule   Oral   Take 40 mg by mouth daily.         . potassium chloride (K-DUR) 10 MEQ  tablet   Oral   Take 10 mEq by mouth daily.         . sertraline (ZOLOFT) 50 MG tablet   Oral   Take 50 mg by mouth daily.         . triazolam (HALCION) 0.25 MG tablet   Oral   Take 0.25 mg by mouth at bedtime as needed.          BP 139/47  Pulse 67  Temp(Src) 97.8 F (36.6 C) (Oral)  Resp 20  SpO2 96% Physical Exam  Nursing note and vitals reviewed. Constitutional: She is oriented to person, place, and time. She appears well-developed and well-nourished. No distress.  HENT:  Head: Normocephalic and atraumatic.  Right Ear: External ear normal.  Left Ear: External ear normal.  Nose: Nose normal.  Mouth/Throat: Oropharynx is clear and moist.  Eyes: Conjunctivae and EOM are normal. Pupils are equal, round, and reactive to light.  Neck: Normal range of motion. Neck supple. No JVD present. No tracheal deviation present. No thyromegaly present.  Cardiovascular: Normal rate, regular rhythm,  normal heart sounds and intact distal pulses.  Exam reveals no gallop and no friction rub.   No murmur heard. Pulmonary/Chest: Effort normal and breath sounds normal. No stridor. No respiratory distress. She has no wheezes. She has no rales. She exhibits no tenderness.  Abdominal: Soft. Bowel sounds are normal. She exhibits no distension and no mass. There is no tenderness. There is no rebound and no guarding.  Musculoskeletal: She exhibits edema and tenderness.  Patient with tenderness with any attempt at range of motion at the left knee.  Patient also reports pain at the left hip with palpation.  Significant amount of effusion at left knee.  Patient is distally neurovascularly intact.  Lymphadenopathy:    She has no cervical adenopathy.  Neurological: She is alert and oriented to person, place, and time. She exhibits normal muscle tone. Coordination normal.  Skin: Skin is warm and dry. No rash noted. No erythema. No pallor.  Psychiatric: She has a normal mood and affect. Her behavior is  normal. Judgment and thought content normal.    ED Course  Procedures (including critical care time) Labs Review Labs Reviewed  BASIC METABOLIC PANEL - Abnormal; Notable for the following:    Glucose, Bld 123 (*)    BUN 32 (*)    GFR calc non Af Amer 43 (*)    GFR calc Af Amer 50 (*)    All other components within normal limits  CBC WITH DIFFERENTIAL - Abnormal; Notable for the following:    RBC 3.44 (*)    Hemoglobin 11.5 (*)    HCT 35.5 (*)    MCV 103.2 (*)    All other components within normal limits  PROTIME-INR  URINALYSIS, ROUTINE W REFLEX MICROSCOPIC  TYPE AND SCREEN   Imaging Review Dg Chest 1 View  12/12/2012   CLINICAL DATA:  Preop. Coronary artery disease. Fall.  EXAM: CHEST - 1 VIEW  COMPARISON:  05/03/2010  FINDINGS: Patient rotated to the right. Osteopenia. High-riding right humeral, consistent with chronic rotator cuff insufficiency. Remote left rib fractures. Cardiomegaly with a tortuous thoracic aorta. Moderate left hemidiaphragm elevation, slightly increased. No pleural effusion or pneumothorax. No lobar consolidation. Nonspecific interstitial thickening in this age group. Mild volume loss/ scarring at the left lung base.  IMPRESSION: No acute findings.  Cardiomegaly and nonspecific interstitial thickening.   Electronically Signed   By: Jeronimo Greaves   On: 12/12/2012 01:18   Dg Femur Left  12/12/2012   CLINICAL DATA:  Fall with femoral pain.  EXAM: LEFT FEMUR - 2 VIEW  COMPARISON:  10/26/2012 hip films.  FINDINGS: Left hip arthroplasty. Remote proximal femoral trauma with wire and screw/plate fixation.  Osteopenia. Markedly comminuted distal femur fracture. Distal fracture fragments displaced medially. Marked rotational component. No definite intra-articular extension.  IMPRESSION: Markedly comminuted, displaced distal femoral fracture.   Electronically Signed   By: Jeronimo Greaves   On: 12/12/2012 01:20   Dg Tibia/fibula Left  12/12/2012   *RADIOLOGY REPORT*  Clinical  Data: Fall with leg injury.  LEFT TIBIA AND FIBULA - 2 VIEW  Comparison: None  Findings: There is no evidence of tibial or fibular fracture however osteopenia/osteoporosis decreases sensitivity. A comminuted displaced and angulated fracture of the distal femur is noted with lipohemarthrosis.  IMPRESSION: Comminuted displaced angulated fracture of the distal femur.  No evidence of tibial/fibular fracture.   Original Report Authenticated By: Harmon Pier, M.D.    Date: 12/12/2012  Rate: 68  Rhythm: normal sinus rhythm  QRS Axis: left  Intervals: normal  ST/T Wave abnormalities: normal  Conduction Disutrbances:right bundle branch block  Narrative Interpretation:   Old EKG Reviewed: changes noted    MDM   1. Anemia   2. Edema, lower extremity   3. Femur fracture, left, closed, initial encounter    79 year old female status post fall with left leg.  Deformity.  EMS reports injury.  Below the knee, patient reports pain above the knee.  Also left hip pain.  Will plan for left hip films, femur, tib-fib.  Pain is well-controlled.  We'll get preop labs, anticipating need for orthopedic surgery.  1:40 AM D/w Dr Juliene Pina on call for Dr Charlann Boxer.  He will see the patient, and plans to admit to the hospital.  Requests medicine consult.     Olivia Mackie, MD 12/12/12 [phone]

## 2012-12-12 NOTE — H&P (Signed)
[name] is an 79 y.o. female.   Chief Complaint: Pain in Left Distal Thigh. HPI: Larey Seat at [facility] and injured her Left Thigh. No other injuries.  Past Medical History  Diagnosis Date  . Arthritis   . Coronary artery disease     Past Surgical History  Procedure Laterality Date  . Hip fracture surgery      x 3    History reviewed. No pertinent family history. Social History:  reports that she has never smoked. She does not have any smokeless tobacco history on file. She reports that  drinks alcohol. She reports that she does not use illicit drugs.  Allergies:  Allergies  Allergen Reactions  . Penicillins Other (See Comments)    Childhood allergy; reaction unknown     (Not in a hospital admission)  Results for orders placed during the hospital encounter of 12/11/12 (from the past 48 hour(s))  BASIC METABOLIC PANEL     Status: Abnormal   Collection Time    12/12/12 12:20 AM      Result Value Range   Sodium 140  135 - 145 mEq/L   Potassium 4.9  3.5 - 5.1 mEq/L   Chloride 103  96 - 112 mEq/L   CO2 29  19 - 32 mEq/L   Glucose, Bld 123 (*) 70 - 99 mg/dL   BUN 32 (*) 6 - 23 mg/dL   Creatinine, Ser 9.56  0.50 - 1.10 mg/dL   Calcium 9.4  8.4 - 21.3 mg/dL   GFR calc non Af Amer 43 (*) >90 mL/min   GFR calc Af Amer 50 (*) >90 mL/min   Comment: (NOTE)     The eGFR has been calculated using the CKD EPI equation.     This calculation has not been validated in all clinical situations.     eGFR's persistently <90 mL/min signify possible Chronic Kidney     Disease.  CBC WITH DIFFERENTIAL     Status: Abnormal   Collection Time    12/12/12 12:20 AM      Result Value Range   WBC 6.1  4.0 - 10.5 K/uL   RBC 3.44 (*) 3.87 - 5.11 MIL/uL   Hemoglobin 11.5 (*) 12.0 - 15.0 g/dL   HCT 08.6 (*) 57.8 - 46.9 %   MCV 103.2 (*) 78.0 - 100.0 fL   MCH 33.4  26.0 - 34.0 pg   MCHC 32.4  30.0 - 36.0 g/dL   RDW 62.9  52.8 - 41.3 %   Platelets 160  150 - 400 K/uL   Neutrophils Relative % 46   43 - 77 %   Neutro Abs 2.8  1.7 - 7.7 K/uL   Lymphocytes Relative 42  12 - 46 %   Lymphs Abs 2.6  0.7 - 4.0 K/uL   Monocytes Relative 7  3 - 12 %   Monocytes Absolute 0.4  0.1 - 1.0 K/uL   Eosinophils Relative 4  0 - 5 %   Eosinophils Absolute 0.3  0.0 - 0.7 K/uL   Basophils Relative 0  0 - 1 %   Basophils Absolute 0.0  0.0 - 0.1 K/uL  PROTIME-INR     Status: None   Collection Time    12/12/12 12:20 AM      Result Value Range   Prothrombin Time 12.5  11.6 - 15.2 seconds   INR 0.95  0.00 - 1.49  TYPE AND SCREEN     Status: None   Collection Time  12/12/12 12:20 AM      Result Value Range   ABO/RH(D) A NEG     Antibody Screen NEG     Sample Expiration 12/15/2012     Dg Chest 1 View  12/12/2012   CLINICAL DATA:  Preop. Coronary artery disease. Fall.  EXAM: CHEST - 1 VIEW  COMPARISON:  05/03/2010  FINDINGS: Patient rotated to the right. Osteopenia. High-riding right humeral, consistent with chronic rotator cuff insufficiency. Remote left rib fractures. Cardiomegaly with a tortuous thoracic aorta. Moderate left hemidiaphragm elevation, slightly increased. No pleural effusion or pneumothorax. No lobar consolidation. Nonspecific interstitial thickening in this age group. Mild volume loss/ scarring at the left lung base.  IMPRESSION: No acute findings.  Cardiomegaly and nonspecific interstitial thickening.   Electronically Signed   By: Jeronimo Greaves   On: 12/12/2012 01:18   Dg Femur Left  12/12/2012   CLINICAL DATA:  Fall with femoral pain.  EXAM: LEFT FEMUR - 2 VIEW  COMPARISON:  10/26/2012 hip films.  FINDINGS: Left hip arthroplasty. Remote proximal femoral trauma with wire and screw/plate fixation.  Osteopenia. Markedly comminuted distal femur fracture. Distal fracture fragments displaced medially. Marked rotational component. No definite intra-articular extension.  IMPRESSION: Markedly comminuted, displaced distal femoral fracture.   Electronically Signed   By: Jeronimo Greaves   On: 12/12/2012  01:20   Dg Tibia/fibula Left  12/12/2012   *RADIOLOGY REPORT*  Clinical Data: Fall with leg injury.  LEFT TIBIA AND FIBULA - 2 VIEW  Comparison: None  Findings: There is no evidence of tibial or fibular fracture however osteopenia/osteoporosis decreases sensitivity. A comminuted displaced and angulated fracture of the distal femur is noted with lipohemarthrosis.  IMPRESSION: Comminuted displaced angulated fracture of the distal femur.  No evidence of tibial/fibular fracture.   Original Report Authenticated By: Harmon Pier, M.D.    Review of Systems  Constitutional: Negative.   HENT: Negative.   Eyes: Negative.   Respiratory: Negative.   Cardiovascular: Negative.   Gastrointestinal: Negative.   Genitourinary: Negative.   Musculoskeletal: Negative.   Skin: Negative.   Neurological: Negative.   Endo/Heme/Allergies: Negative.     Blood pressure 139/47, pulse 67, temperature 97.8 F (36.6 C), temperature source Oral, resp. rate 20, SpO2 96.00%. Physical Exam  Constitutional: She appears well-developed.  HENT:  Head: Normocephalic.  Eyes: Pupils are equal, round, and reactive to light.  Neck: Normal range of motion.  Cardiovascular: Normal rate.   Respiratory: Effort normal.  GI: Soft.  Musculoskeletal:       Left knee: She exhibits swelling, deformity and bony tenderness. Tenderness found.     Assessment/Plan Admit for open reduction and internal Fixation of Left distal Femur.  Aleeya Veitch A 12/12/2012, 2:09 AM

## 2012-12-12 NOTE — Consult Note (Signed)
Reason for Consult: Medical management. Referring Physician: Dr. Darrelyn Hillock.  [name] is an 79 y.o. female.  HPI: Was brought to the ER after patient had a fall while going to the bathroom. Patient denies having hit her head or lost consciousness. X-rays show distal femoral fracture. Patient denies any chest pain shortness of breath nausea vomiting abdominal pain fever chills diarrhea. Patient takes Lasix for lower extremity edema. Patient presently is not in acute distress.  Past Medical History  Diagnosis Date  . Arthritis   . Coronary artery disease     Past Surgical History  Procedure Laterality Date  . Hip fracture surgery      x 3    History reviewed. No pertinent family history.  Social History:  reports that she has never smoked. She does not have any smokeless tobacco history on file. She reports that  drinks alcohol. She reports that she does not use illicit drugs.  Allergies:  Allergies  Allergen Reactions  . Penicillins Other (See Comments)    Childhood allergy; reaction unknown    Medications: I have reviewed the patient's current medications.  Results for orders placed during the hospital encounter of 12/11/12 (from the past 48 hour(s))  BASIC METABOLIC PANEL     Status: Abnormal   Collection Time    12/12/12 12:20 AM      Result Value Range   Sodium 140  135 - 145 mEq/L   Potassium 4.9  3.5 - 5.1 mEq/L   Chloride 103  96 - 112 mEq/L   CO2 29  19 - 32 mEq/L   Glucose, Bld 123 (*) 70 - 99 mg/dL   BUN 32 (*) 6 - 23 mg/dL   Creatinine, Ser 1.61  0.50 - 1.10 mg/dL   Calcium 9.4  8.4 - 09.6 mg/dL   GFR calc non Af Amer 43 (*) >90 mL/min   GFR calc Af Amer 50 (*) >90 mL/min   Comment: (NOTE)     The eGFR has been calculated using the CKD EPI equation.     This calculation has not been validated in all clinical situations.     eGFR's persistently <90 mL/min signify possible Chronic Kidney     Disease.  CBC WITH DIFFERENTIAL     Status: Abnormal   Collection Time    12/12/12 12:20 AM      Result Value Range   WBC 6.1  4.0 - 10.5 K/uL   RBC 3.44 (*) 3.87 - 5.11 MIL/uL   Hemoglobin 11.5 (*) 12.0 - 15.0 g/dL   HCT 04.5 (*) 40.9 - 81.1 %   MCV 103.2 (*) 78.0 - 100.0 fL   MCH 33.4  26.0 - 34.0 pg   MCHC 32.4  30.0 - 36.0 g/dL   RDW 91.4  78.2 - 95.6 %   Platelets 160  150 - 400 K/uL   Neutrophils Relative % 46  43 - 77 %   Neutro Abs 2.8  1.7 - 7.7 K/uL   Lymphocytes Relative 42  12 - 46 %   Lymphs Abs 2.6  0.7 - 4.0 K/uL   Monocytes Relative 7  3 - 12 %   Monocytes Absolute 0.4  0.1 - 1.0 K/uL   Eosinophils Relative 4  0 - 5 %   Eosinophils Absolute 0.3  0.0 - 0.7 K/uL   Basophils Relative 0  0 - 1 %   Basophils Absolute 0.0  0.0 - 0.1 K/uL  PROTIME-INR     Status: None  Collection Time    12/12/12 12:20 AM      Result Value Range   Prothrombin Time 12.5  11.6 - 15.2 seconds   INR 0.95  0.00 - 1.49  TYPE AND SCREEN     Status: None   Collection Time    12/12/12 12:20 AM      Result Value Range   ABO/RH(D) A NEG     Antibody Screen NEG     Sample Expiration 12/15/2012      Dg Chest 1 View  12/12/2012   CLINICAL DATA:  Preop. Coronary artery disease. Fall.  EXAM: CHEST - 1 VIEW  COMPARISON:  05/03/2010  FINDINGS: Patient rotated to the right. Osteopenia. High-riding right humeral, consistent with chronic rotator cuff insufficiency. Remote left rib fractures. Cardiomegaly with a tortuous thoracic aorta. Moderate left hemidiaphragm elevation, slightly increased. No pleural effusion or pneumothorax. No lobar consolidation. Nonspecific interstitial thickening in this age group. Mild volume loss/ scarring at the left lung base.  IMPRESSION: No acute findings.  Cardiomegaly and nonspecific interstitial thickening.   Electronically Signed   By: Jeronimo Greaves   On: 12/12/2012 01:18   Dg Femur Left  12/12/2012   CLINICAL DATA:  Fall with femoral pain.  EXAM: LEFT FEMUR - 2 VIEW  COMPARISON:  10/26/2012 hip films.  FINDINGS: Left hip  arthroplasty. Remote proximal femoral trauma with wire and screw/plate fixation.  Osteopenia. Markedly comminuted distal femur fracture. Distal fracture fragments displaced medially. Marked rotational component. No definite intra-articular extension.  IMPRESSION: Markedly comminuted, displaced distal femoral fracture.   Electronically Signed   By: Jeronimo Greaves   On: 12/12/2012 01:20   Dg Tibia/fibula Left  12/12/2012   *RADIOLOGY REPORT*  Clinical Data: Fall with leg injury.  LEFT TIBIA AND FIBULA - 2 VIEW  Comparison: None  Findings: There is no evidence of tibial or fibular fracture however osteopenia/osteoporosis decreases sensitivity. A comminuted displaced and angulated fracture of the distal femur is noted with lipohemarthrosis.  IMPRESSION: Comminuted displaced angulated fracture of the distal femur.  No evidence of tibial/fibular fracture.   Original Report Authenticated By: Harmon Pier, M.D.    Review of Systems  Constitutional: Negative.   HENT: Negative.   Eyes: Negative.   Respiratory: Negative.   Cardiovascular: Negative.   Gastrointestinal: Negative.   Genitourinary: Negative.   Musculoskeletal: Positive for falls.  Skin: Negative.   Neurological: Negative.   Endo/Heme/Allergies: Negative.   Psychiatric/Behavioral: Negative.    Blood pressure 135/73, pulse 77, temperature 98.4 F (36.9 C), temperature source Oral, resp. rate 20, SpO2 89.00%. Physical Exam  Constitutional: She is oriented to person, place, and time. She appears well-developed and well-nourished. No distress.  HENT:  Head: Normocephalic and atraumatic.  Eyes: Conjunctivae are normal. Pupils are equal, round, and reactive to light. Right eye exhibits no discharge. Left eye exhibits no discharge.  Neck: Normal range of motion. Neck supple.  Cardiovascular: Normal rate and regular rhythm.   Respiratory: Effort normal and breath sounds normal.  GI: Soft. Bowel sounds are normal.  Musculoskeletal: She exhibits  edema.  Left leg in brace.  Neurological: She is alert and oriented to person, place, and time. No cranial nerve deficit.  Skin: Skin is warm and dry. She is not diaphoretic.  Psychiatric: Her behavior is normal.   EKG - normal sinus rhythm with atypical RBBB.  Assessment/Plan: #1. Left distal femur fracture - patient from medical standpoint after appears stable. Further recommendations per orthopedics. #2. History of chronic lower extremity edema -  hold Lasix for now until surgery and resume after surgery if indicated and patient stable. #3. Mild anemia - follow CBC.  Thanks for involving [place] in patient's care. We'll follow along with you.  Dontay Harm N. 12/12/2012, 3:31 AM

## 2012-12-13 ENCOUNTER — Inpatient Hospital Stay (HOSPITAL_COMMUNITY): Payer: Medicare Other

## 2012-12-13 ENCOUNTER — Encounter (HOSPITAL_COMMUNITY)
Admission: EM | Disposition: A | Discharge status: Skilled Nursing Facility | Payer: Medicare Other | Source: Home / Self Care | Attending: Orthopedic Surgery

## 2012-12-13 ENCOUNTER — Inpatient Hospital Stay (HOSPITAL_COMMUNITY): Payer: Medicare Other | Admitting: Anesthesiology

## 2012-12-13 ENCOUNTER — Encounter (HOSPITAL_COMMUNITY): Payer: Self-pay | Admitting: Anesthesiology

## 2012-12-13 HISTORY — PX: ORIF FEMUR FRACTURE: SHX2119

## 2012-12-13 LAB — COMPREHENSIVE METABOLIC PANEL
ALT: 20 U/L (ref 0–35)
AST: 26 U/L (ref 0–37)
Albumin: 2.5 g/dL — ABNORMAL LOW (ref 3.5–5.2)
Alkaline Phosphatase: 65 U/L (ref 39–117)
BUN: 26 mg/dL — ABNORMAL HIGH (ref 6–23)
CO2: 28 mEq/L (ref 19–32)
Calcium: 8.5 mg/dL (ref 8.4–10.5)
Chloride: 107 mEq/L (ref 96–112)
Creatinine, Ser: 0.87 mg/dL (ref 0.50–1.10)
GFR calc Af Amer: 64 mL/min — ABNORMAL LOW (ref >90)
GFR calc non Af Amer: 55 mL/min — ABNORMAL LOW (ref >90)
Glucose, Bld: 133 mg/dL — ABNORMAL HIGH (ref 70–99)
Potassium: 4.6 mEq/L (ref 3.5–5.1)
Sodium: 140 mEq/L (ref 135–145)
Total Bilirubin: 0.4 mg/dL (ref 0.3–1.2)
Total Protein: 5 g/dL — ABNORMAL LOW (ref 6.0–8.3)

## 2012-12-13 LAB — CBC
HCT: 24.5 % — ABNORMAL LOW (ref 36.0–46.0)
Hemoglobin: 7.9 g/dL — ABNORMAL LOW (ref 12.0–15.0)
MCH: 33.3 pg (ref 26.0–34.0)
MCHC: 32.2 g/dL (ref 30.0–36.0)
MCV: 103.4 fL — ABNORMAL HIGH (ref 78.0–100.0)
Platelets: 111 10*3/uL — ABNORMAL LOW (ref 150–400)
RBC: 2.37 MIL/uL — ABNORMAL LOW (ref 3.87–5.11)
RDW: 14.4 % (ref 11.5–15.5)
WBC: 7.1 10*3/uL (ref 4.0–10.5)

## 2012-12-13 LAB — PROTIME-INR
INR: 1.1 (ref 0.00–1.49)
Prothrombin Time: 14 seconds (ref 11.6–15.2)

## 2012-12-13 LAB — APTT: aPTT: 33 seconds (ref 24–37)

## 2012-12-13 SURGERY — OPEN REDUCTION INTERNAL FIXATION (ORIF) DISTAL FEMUR FRACTURE
Anesthesia: General | Site: Leg Upper | Laterality: Left | Wound class: Clean

## 2012-12-13 MED ORDER — DEXAMETHASONE SODIUM PHOSPHATE 10 MG/ML IJ SOLN
INTRAMUSCULAR | Status: DC | PRN
  Administered 2012-12-13: 10 mg via INTRAVENOUS

## 2012-12-13 MED ORDER — METHOCARBAMOL 500 MG PO TABS: 500.0000 mg | ORAL_TABLET | Freq: Four times a day (QID) | ORAL | Status: DC | PRN

## 2012-12-13 MED ORDER — PROPOFOL 10 MG/ML IV BOLUS
INTRAVENOUS | Status: DC | PRN
  Administered 2012-12-13: 70 mg via INTRAVENOUS

## 2012-12-13 MED ORDER — GLYCOPYRROLATE 0.2 MG/ML IJ SOLN
INTRAMUSCULAR | Status: DC | PRN
  Administered 2012-12-13: 0.4 mg via INTRAVENOUS

## 2012-12-13 MED ORDER — MENTHOL 3 MG MT LOZG
1.0000 | LOZENGE | OROMUCOSAL | Status: DC | PRN
  Filled 2012-12-13: qty 9

## 2012-12-13 MED ORDER — PHENYLEPHRINE HCL 10 MG/ML IJ SOLN
INTRAMUSCULAR | Status: DC | PRN
  Administered 2012-12-13: 240 ug via INTRAVENOUS
  Administered 2012-12-13: 40 ug via INTRAVENOUS

## 2012-12-13 MED ORDER — CEFAZOLIN SODIUM-DEXTROSE 2-3 GM-% IV SOLR
INTRAVENOUS | Status: DC | PRN
  Administered 2012-12-13: 2 g via INTRAVENOUS

## 2012-12-13 MED ORDER — METHOCARBAMOL 100 MG/ML IJ SOLN
500.0000 mg | Freq: Four times a day (QID) | INTRAVENOUS | Status: DC | PRN
  Filled 2012-12-13: qty 5

## 2012-12-13 MED ORDER — FENTANYL CITRATE 0.05 MG/ML IJ SOLN
25.0000 ug | INTRAMUSCULAR | Status: DC | PRN
  Administered 2012-12-13 (×2): 50 ug via INTRAVENOUS

## 2012-12-13 MED ORDER — ONDANSETRON HCL 4 MG/2ML IJ SOLN
INTRAMUSCULAR | Status: DC | PRN
  Administered 2012-12-13: 4 mg via INTRAVENOUS

## 2012-12-13 MED ORDER — ENOXAPARIN SODIUM 40 MG/0.4ML ~~LOC~~ SOLN
40.0000 mg | SUBCUTANEOUS | Status: DC
  Administered 2012-12-14: 40 mg via SUBCUTANEOUS
  Filled 2012-12-13 (×2): qty 0.4

## 2012-12-13 MED ORDER — METOCLOPRAMIDE HCL 10 MG PO TABS: 5.0000 mg | ORAL_TABLET | Freq: Three times a day (TID) | ORAL | Status: DC | PRN

## 2012-12-13 MED ORDER — SODIUM CHLORIDE 0.9 % IV SOLN
INTRAVENOUS | Status: DC
  Administered 2012-12-13: 18:00:00 via INTRAVENOUS

## 2012-12-13 MED ORDER — LIDOCAINE HCL (CARDIAC) 20 MG/ML IV SOLN
INTRAVENOUS | Status: DC | PRN
  Administered 2012-12-13: 100 mg via INTRAVENOUS

## 2012-12-13 MED ORDER — PHENOL 1.4 % MT LIQD
1.0000 | OROMUCOSAL | Status: DC | PRN
  Filled 2012-12-13: qty 177

## 2012-12-13 MED ORDER — SUCCINYLCHOLINE CHLORIDE 20 MG/ML IJ SOLN
INTRAMUSCULAR | Status: DC | PRN
  Administered 2012-12-13: 100 mg via INTRAVENOUS

## 2012-12-13 MED ORDER — METOCLOPRAMIDE HCL 5 MG/ML IJ SOLN: 10.0000 mg | Freq: Once | INTRAMUSCULAR | Status: DC | PRN

## 2012-12-13 MED ORDER — ACETAMINOPHEN 10 MG/ML IV SOLN
1000.0000 mg | Freq: Once | INTRAVENOUS | Status: AC
  Administered 2012-12-13: 17:00:00 1000 mg via INTRAVENOUS
  Filled 2012-12-13 (×2): qty 100

## 2012-12-13 MED ORDER — METOCLOPRAMIDE HCL 5 MG/ML IJ SOLN: 5.0000 mg | Freq: Three times a day (TID) | INTRAMUSCULAR | Status: DC | PRN

## 2012-12-13 MED ORDER — HYDROMORPHONE HCL PF 1 MG/ML IJ SOLN: 0.5000 mg | INTRAMUSCULAR | Status: DC | PRN

## 2012-12-13 MED ORDER — ROCURONIUM BROMIDE 100 MG/10ML IV SOLN
INTRAVENOUS | Status: DC | PRN
  Administered 2012-12-13: 30 mg via INTRAVENOUS
  Administered 2012-12-13: 10 mg via INTRAVENOUS

## 2012-12-13 MED ORDER — ETOMIDATE 2 MG/ML IV SOLN
INTRAVENOUS | Status: DC | PRN
  Administered 2012-12-13: 14 mg via INTRAVENOUS

## 2012-12-13 MED ORDER — CLINDAMYCIN PHOSPHATE 600 MG/50ML IV SOLN
600.0000 mg | Freq: Four times a day (QID) | INTRAVENOUS | Status: AC
  Administered 2012-12-13 – 2012-12-14 (×2): 600 mg via INTRAVENOUS
  Filled 2012-12-13 (×2): qty 50

## 2012-12-13 MED ORDER — 0.9 % SODIUM CHLORIDE (POUR BTL) OPTIME
TOPICAL | Status: DC | PRN
  Administered 2012-12-13: 1000 mL

## 2012-12-13 MED ORDER — FERROUS SULFATE 325 (65 FE) MG PO TABS
325.0000 mg | ORAL_TABLET | Freq: Three times a day (TID) | ORAL | Status: DC
  Administered 2012-12-14 – 2012-12-17 (×6): 325 mg via ORAL
  Filled 2012-12-13 (×13): qty 1

## 2012-12-13 MED ORDER — CEFAZOLIN SODIUM-DEXTROSE 2-3 GM-% IV SOLR
INTRAVENOUS | Status: AC
  Filled 2012-12-13: qty 50

## 2012-12-13 MED ORDER — FENTANYL CITRATE 0.05 MG/ML IJ SOLN
INTRAMUSCULAR | Status: DC | PRN
  Administered 2012-12-13: 25 ug via INTRAVENOUS

## 2012-12-13 MED ORDER — NEOSTIGMINE METHYLSULFATE 1 MG/ML IJ SOLN
INTRAMUSCULAR | Status: DC | PRN
  Administered 2012-12-13: 3 mg via INTRAVENOUS

## 2012-12-13 MED ORDER — FLEET ENEMA 7-19 GM/118ML RE ENEM: 1.0000 | ENEMA | Freq: Once | RECTAL | Status: AC | PRN

## 2012-12-13 MED ORDER — DOCUSATE SODIUM 100 MG PO CAPS
100.0000 mg | ORAL_CAPSULE | Freq: Two times a day (BID) | ORAL | Status: DC
  Administered 2012-12-13 – 2012-12-17 (×7): 100 mg via ORAL

## 2012-12-13 MED ORDER — FENTANYL CITRATE 0.05 MG/ML IJ SOLN
INTRAMUSCULAR | Status: AC
  Filled 2012-12-13: qty 2

## 2012-12-13 MED ORDER — SODIUM CHLORIDE 0.9 % IV SOLN
10.0000 mg | INTRAVENOUS | Status: DC | PRN
  Administered 2012-12-13: 10 ug/min via INTRAVENOUS

## 2012-12-13 SURGICAL SUPPLY — 59 items
BAG ZIPLOCK 12X15 (MISCELLANEOUS) ×2 IMPLANT
BANDAGE ELASTIC 6 VELCRO ST LF (GAUZE/BANDAGES/DRESSINGS) ×2 IMPLANT
BANDAGE GAUZE ELAST BULKY 4 IN (GAUZE/BANDAGES/DRESSINGS) ×2 IMPLANT
BIT DRILL 3.2 CALIBRATED (BIT) ×1
BIT DRILL 3.2MM CALIBRATED (BIT) ×1 IMPLANT
BIT DRILL 3.8 CALIBRATED (BIT) ×1
BIT DRILL 3.8MM CALIBRATED (BIT) ×1 IMPLANT
BNDG COHESIVE 4X5 TAN STRL (GAUZE/BANDAGES/DRESSINGS) ×2 IMPLANT
CLOTH BEACON ORANGE TIMEOUT ST (SAFETY) ×2 IMPLANT
DRAPE LG THREE QUARTER DISP (DRAPES) ×2 IMPLANT
DRAPE ORTHO 2.5IN SPLIT 77X108 (DRAPES) ×2 IMPLANT
DRAPE ORTHO SPLIT 77X108 STRL (DRAPES) ×2
DRAPE POUCH INSTRU U-SHP 10X18 (DRAPES) ×2 IMPLANT
DRAPE U-SHAPE 47X51 STRL (DRAPES) ×2 IMPLANT
DRILL BIT 3.2MM CALIBRATED (BIT) ×1
DRILL BIT 3.8MM CALIBRATED (BIT) ×1
DRSG EMULSION OIL 3X16 NADH (GAUZE/BANDAGES/DRESSINGS) IMPLANT
DURAPREP 26ML APPLICATOR (WOUND CARE) ×2 IMPLANT
ELECT REM PT RETURN 9FT ADLT (ELECTROSURGICAL) ×2
ELECTRODE REM PT RTRN 9FT ADLT (ELECTROSURGICAL) ×1 IMPLANT
FACESHIELD LNG OPTICON STERILE (SAFETY) ×6 IMPLANT
GAUZE XEROFORM 5X9 LF (GAUZE/BANDAGES/DRESSINGS) ×4 IMPLANT
GLOVE BIOGEL PI IND STRL 7.5 (GLOVE) ×1 IMPLANT
GLOVE BIOGEL PI IND STRL 8 (GLOVE) ×1 IMPLANT
GLOVE BIOGEL PI INDICATOR 7.5 (GLOVE) ×1
GLOVE BIOGEL PI INDICATOR 8 (GLOVE) ×1
GLOVE ECLIPSE 8.0 STRL XLNG CF (GLOVE) ×2 IMPLANT
GLOVE ORTHO TXT STRL SZ7.5 (GLOVE) ×8 IMPLANT
GLOVE SURG ORTHO 8.0 STRL STRW (GLOVE) ×2 IMPLANT
GOWN BRE IMP PREV XXLGXLNG (GOWN DISPOSABLE) ×4 IMPLANT
GOWN PREVENTION PLUS LG XLONG (DISPOSABLE) ×2 IMPLANT
GUIDEPIN 3.2  ENDO CALB STRL (PIN) ×1
GUIDEPIN 3.2 ENDO CALB STRL (PIN) ×1 IMPLANT
KIT BASIN OR (CUSTOM PROCEDURE TRAY) ×2 IMPLANT
MANIFOLD NEPTUNE II (INSTRUMENTS) ×2 IMPLANT
NS IRRIG 1000ML POUR BTL (IV SOLUTION) ×4 IMPLANT
PACK TOTAL JOINT (CUSTOM PROCEDURE TRAY) ×2 IMPLANT
PAD ABD 7.5X8 STRL (GAUZE/BANDAGES/DRESSINGS) ×4 IMPLANT
PLATE FEM 12H LT (Plate) ×2 IMPLANT
PLATE FEMORAL 9H LT (Plate) ×2 IMPLANT
POSITIONER SURGICAL ARM (MISCELLANEOUS) ×2 IMPLANT
SCREW LOCK CORT PLY 4.5X34 (Screw) ×6 IMPLANT
SCREW LOCK DIST FEM 5.5X50 (Screw) ×2 IMPLANT
SCREW LOCK DIST FEM 5.5X70 (Screw) ×2 IMPLANT
SCREW LOCK PLY DIST FEM 4.5X32 (Screw) ×4 IMPLANT
SCREW LOCK PLY PROX TIB 8X80 (Screw) ×2 IMPLANT
SCREW LOCK PLY TIB 5.5X40 (Screw) ×2 IMPLANT
SCREW LOCK PLY TIB 5.5X75 (Screw) ×4 IMPLANT
SCREW NLOCK CORT STAR 4.5X38 (Screw) ×4 IMPLANT
SPONGE GAUZE 4X4 12PLY (GAUZE/BANDAGES/DRESSINGS) ×2 IMPLANT
STAPLER VISISTAT 35W (STAPLE) ×2 IMPLANT
STRIP CLOSURE SKIN 1/2X4 (GAUZE/BANDAGES/DRESSINGS) IMPLANT
SUT MNCRL AB 4-0 PS2 18 (SUTURE) ×2 IMPLANT
SUT VIC AB 1 CT1 36 (SUTURE) ×6 IMPLANT
SUT VIC AB 2-0 CT1 27 (SUTURE) ×3
SUT VIC AB 2-0 CT1 TAPERPNT 27 (SUTURE) ×3 IMPLANT
SUT VLOC 180 0 24IN GS25 (SUTURE) ×2 IMPLANT
TOWEL OR 17X26 10 PK STRL BLUE (TOWEL DISPOSABLE) ×4 IMPLANT
WATER STERILE IRR 1500ML POUR (IV SOLUTION) ×2 IMPLANT

## 2012-12-13 NOTE — Brief Op Note (Signed)
12/11/2012 - 12/13/2012  4:18 PM  PATIENT:  [name]  79 y.o. female  PRE-OPERATIVE DIAGNOSIS:  left distal comminuted femur fracture  POST-OPERATIVE DIAGNOSIS:  left distal comminuted femur fracture  PROCEDURE:  Procedure(s): OPEN REDUCTION INTERNAL FIXATION (ORIF) DISTAL FEMUR FRACTURE (Left) Biomet 9-hole plate  SURGEON:  Surgeon(s) and Role:    * [name], MD - Primary  PHYSICIAN ASSISTANT: Lanney Gins, PA-C  ANESTHESIA:   general  EBL:  Total I/O In: 2103 [I.V.:1503; Blood:600] Out: 850 [Urine:450; Blood:400]  BLOOD ADMINISTERED:2 units PRBC  DRAINS: none   LOCAL MEDICATIONS USED:  NONE  SPECIMEN:  No Specimen  DISPOSITION OF SPECIMEN:  N/A  COUNTS:  YES  TOURNIQUET:  * No tourniquets in log *  DICTATION: .Other Dictation: Dictation Number [id number]  PLAN OF CARE: Admit to inpatient   PATIENT DISPOSITION:  PACU - hemodynamically stable.   Delay start of Pharmacological VTE agent (>24hrs) due to surgical blood loss or risk of bleeding: no

## 2012-12-13 NOTE — Transfer of Care (Signed)
Immediate Anesthesia Transfer of Care Note  Patient: [name]  Procedure(s) Performed: Procedure(s): OPEN REDUCTION INTERNAL FIXATION (ORIF) DISTAL FEMUR FRACTURE (Left)  Patient Location: PACU  Anesthesia Type:General  Level of Consciousness: sedated  Airway & Oxygen Therapy: Patient Spontanous Breathing and Patient connected to face mask oxygen  Post-op Assessment: Report given to PACU RN and Post -op Vital signs reviewed and stable  Post vital signs: Reviewed and stable  Complications: No apparent anesthesia complications

## 2012-12-13 NOTE — Anesthesia Preprocedure Evaluation (Signed)
Anesthesia Evaluation  Patient identified by MRN, date of birth, ID band Patient awake    Reviewed: Allergy & Precautions, H&P , NPO status , Patient's Chart, lab work & pertinent test results, reviewed documented beta blocker date and time   History of Anesthesia Complications Negative for: history of anesthetic complications  Airway Mallampati: II TM Distance: >3 FB Neck ROM: full    Dental  (+) Teeth Intact Mucous membranes very dry:   Pulmonary neg pulmonary ROS,  breath sounds clear to auscultation  Pulmonary exam normal       Cardiovascular Exercise Tolerance: Good + Past MI (40 years ago, related to "stress", no problems since) + dysrhythmias (RBBB on EKG) Rhythm:regular Rate:Normal + Systolic murmurs    Neuro/Psych negative neurological ROS  negative psych ROS   GI/Hepatic Neg liver ROS, GERD-  Medicated,  Endo/Other  negative endocrine ROS  Renal/GU negative Renal ROS  negative genitourinary   Musculoskeletal   Abdominal   Peds  Hematology  (+) anemia , hgb 7.9 today and plt 111, will T&C Received one dose of lovenox yesterday   Anesthesia Other Findings Last ate prior to midnight  Has a DNR on her chart  Reproductive/Obstetrics negative OB ROS                           Anesthesia Physical Anesthesia Plan  ASA: III  Anesthesia Plan: General ETT   Post-op Pain Management:    Induction:   Airway Management Planned:   Additional Equipment:   Intra-op Plan:   Post-operative Plan:   Informed Consent: I have reviewed the patients History and Physical, chart, labs and discussed the procedure including the risks, benefits and alternatives for the proposed anesthesia with the patient or authorized representative who has indicated his/her understanding and acceptance.   Dental Advisory Given  Plan Discussed with: CRNA and Surgeon  Anesthesia Plan Comments:          Anesthesia Quick Evaluation

## 2012-12-13 NOTE — Anesthesia Postprocedure Evaluation (Signed)
  Anesthesia Post-op Note  Anesthesia Post Note  Patient: [name]  Procedure(s) Performed: Procedure(s) (LRB): OPEN REDUCTION INTERNAL FIXATION (ORIF) DISTAL FEMUR FRACTURE (Left)  Anesthesia type: General  Patient location: PACU  Post pain: Pain level controlled  Post assessment: Post-op Vital signs reviewed  Last Vitals:  Filed Vitals:   12/13/12 1800  BP: 118/50  Pulse: 94  Temp: 37 C  Resp: 14    Post vital signs: Reviewed  Level of consciousness: sedated  Complications: No apparent anesthesia complications

## 2012-12-13 NOTE — Progress Notes (Signed)
pacu nursing: One silver colored watch returned to room with patient (brought down to the o.r. For surgery)

## 2012-12-13 NOTE — Interval H&P Note (Signed)
History and Physical Interval Note:  12/13/2012 12:47 PM  [name]  has presented today for surgery, with the diagnosis of left distal femur fracture  The various methods of treatment have been discussed with the patient and family. After consideration of risks, benefits and other options for treatment, the patient has consented to  Procedure(s): OPEN REDUCTION INTERNAL FIXATION (ORIF) DISTAL FEMUR FRACTURE (Left) as a surgical intervention .  The patient's history has been reviewed, patient examined, no change in status, stable for surgery.  I have reviewed the patient's chart and labs.  Questions were answered to the patient's satisfaction.     [name]

## 2012-12-13 NOTE — H&P (View-Only) (Signed)
   Subjective: Left distal femur fracture  Patient reports pain as mild, with no movement. Have discussed surgery, she is happy that Dr. Olin is the one doing surgery. She had a previous left hip done by Dr. Olin and is happy he is doing this surgery as well.   Objective:   VITALS:   Filed Vitals:   12/13/12 0557  BP: 105/60  Pulse: 86  Temp: 98.8 F (37.1 C)  Resp: 18    Neurovascular intact Dorsiflexion/Plantar flexion intact No cellulitis present Compartment soft  LABS  Recent Labs  12/12/12 0020 12/13/12 0530  HGB 11.5* 7.9*  HCT 35.5* 24.5*  WBC 6.1 7.1  PLT 160 111*     Recent Labs  12/12/12 0020 12/13/12 0530  NA 140 140  K 4.9 4.6  BUN 32* 26*  CREATININE 1.07 0.87  GLUCOSE 123* 133*     Assessment/Plan: Left distal femur fracture  NPO today As long as she is cleared, plan would be for surgery later today.   Consent obtained for ORIF of the distal left femur per Dr. Olin.      Kaydi Kley S. Aleksi Brummet   PAC  12/13/2012, 9:50 AM   

## 2012-12-13 NOTE — Anesthesia Procedure Notes (Addendum)
Performed by: Carmelia Roller R   Procedure Name: Intubation Date/Time: 12/13/2012 1:40 PM Performed by: Doran Clay Pre-anesthesia Checklist: Patient identified, Timeout performed, Emergency Drugs available, Suction available and Patient being monitored Patient Re-evaluated:Patient Re-evaluated prior to inductionOxygen Delivery Method: Circle system utilized Preoxygenation: Pre-oxygenation with 100% oxygen Intubation Type: IV induction Ventilation: Mask ventilation without difficulty Laryngoscope Size: Mac and 3 Grade View: Grade II Number of attempts: 2 Airway Equipment and Method: Bougie stylet and Stylet Placement Confirmation: ETT inserted through vocal cords under direct vision,  breath sounds checked- equal and bilateral and positive ETCO2 Secured at: 21 cm Tube secured with: Tape Dental Injury: Teeth and Oropharynx as per pre-operative assessment

## 2012-12-13 NOTE — Progress Notes (Signed)
   Subjective: Left distal femur fracture  Patient reports pain as mild, with no movement. Have discussed surgery, she is happy that Dr. Charlann Boxer is the one doing surgery. She had a previous left hip done by Dr. Charlann Boxer and is happy he is doing this surgery as well.   Objective:   VITALS:   Filed Vitals:   12/13/12 0557  BP: 105/60  Pulse: 86  Temp: 98.8 F (37.1 C)  Resp: 18    Neurovascular intact Dorsiflexion/Plantar flexion intact No cellulitis present Compartment soft  LABS  Recent Labs  12/12/12 0020 12/13/12 0530  HGB 11.5* 7.9*  HCT 35.5* 24.5*  WBC 6.1 7.1  PLT 160 111*     Recent Labs  12/12/12 0020 12/13/12 0530  NA 140 140  K 4.9 4.6  BUN 32* 26*  CREATININE 1.07 0.87  GLUCOSE 123* 133*     Assessment/Plan: Left distal femur fracture  NPO today As long as she is cleared, plan would be for surgery later today.   Consent obtained for ORIF of the distal left femur per Dr. Charlann Boxer.      Anastasio Auerbach Paz Fuentes   PAC  12/13/2012, 9:50 AM

## 2012-12-14 DIAGNOSIS — D500 Iron deficiency anemia secondary to blood loss (chronic): Secondary | ICD-10-CM | POA: Diagnosis not present

## 2012-12-14 DIAGNOSIS — E663 Overweight: Secondary | ICD-10-CM

## 2012-12-14 LAB — BASIC METABOLIC PANEL
BUN: 22 mg/dL (ref 6–23)
CO2: 29 mEq/L (ref 19–32)
CO2: 24 mEq/L (ref 19–32)
Creatinine, Ser: 0.86 mg/dL (ref 0.50–1.10)
Creatinine, Ser: 0.81 mg/dL (ref 0.50–1.10)
GFR calc Af Amer: 70 mL/min — ABNORMAL LOW (ref >90)
GFR calc non Af Amer: 56 mL/min — ABNORMAL LOW (ref >90)
GFR calc non Af Amer: 60 mL/min — ABNORMAL LOW (ref >90)
Glucose, Bld: 144 mg/dL — ABNORMAL HIGH (ref 70–99)
Potassium: 5.2 mEq/L — ABNORMAL HIGH (ref 3.5–5.1)
Potassium: 4.4 mEq/L (ref 3.5–5.1)
Sodium: 141 mEq/L (ref 135–145)
Sodium: 137 mEq/L (ref 135–145)

## 2012-12-14 LAB — CBC
HCT: 25.3 % — ABNORMAL LOW (ref 36.0–46.0)
Hemoglobin: 8.3 g/dL — ABNORMAL LOW (ref 12.0–15.0)
MCH: 31.9 pg (ref 26.0–34.0)
MCHC: 32.8 g/dL (ref 30.0–36.0)
MCV: 97.3 fL (ref 78.0–100.0)
RBC: 2.6 MIL/uL — ABNORMAL LOW (ref 3.87–5.11)

## 2012-12-14 MED ORDER — SODIUM CHLORIDE 0.9 % IV SOLN
INTRAVENOUS | Status: DC
  Administered 2012-12-15 – 2012-12-16 (×2): via INTRAVENOUS

## 2012-12-14 NOTE — Op Note (Signed)
NAMEADAIJAH, ENDRES NO.:  [id number]  MEDICAL RECORD NO.:  [id number]  LOCATION:  1614                         FACILITY:  [facility]  PHYSICIAN:  Madlyn Frankel. Charlann Boxer, M.D.  DATE OF BIRTH:  09-07-1915  DATE OF PROCEDURE:  12/13/2012 DATE OF DISCHARGE:                              OPERATIVE REPORT   PREOPERATIVE DIAGNOSIS:  Comminuted displaced left distal femur fracture.  POSTOPERATIVE DIAGNOSIS:  Comminuted displaced left distal femur fracture.  PROCEDURE:  Open reduction and internal fixation of the left distal femur fracture utilizing a Biomet 9 hole poly-axial plate.  SURGEON:  Madlyn Frankel. Charlann Boxer, M.D.  ASSISTANT:  Lanney Gins, PA-C.  ANESTHESIA:  General.  SPECIMENS:  None.  COMPLICATION:  None apparent.  IV FLUIDS:  Did include 2 units of packed red blood cells for preoperative hemoglobin of 7.9.  BLOOD LOSS:  About 400 mL.  INDICATIONS FOR PROCEDURE:  Ms. [name] is a 79 year old female who unfortunately had a stumble and fall ground level landing on her left lower extremity.  She had immediate onset of pain and inability to bear weight, was brought to the emergency room, where a comminuted distal femur fracture was noted.  She had a left previous total hip revision surgery, proximal.  This is important information from her findings standpoint due to selection of stem, or as selection of plate. Nonetheless, the fracture was identified.  The risks and benefits, pros and cons, necessity of the procedure were discussed and consent was obtained for open reduction and internal fixation.  Risks of nonunion, need for future surgery, complications including infection, DVT, will be discussed and reviewed with the family in the postoperative period as they were reviewed preoperatively.  PROCEDURE IN DETAIL:  The patient was brought to the operative theater. Once adequate anesthesia, preoperative antibiotics, Ancef administered, she was positioned supine.   The left leg was prepped and pre draped out from the perineal groin area.  It was then prepped and draped in a sterile fashion using a split drape technique to expose the entire left lower extremity from the upper portion of the lower leg to the hip.  A time-out was performed identifying the patient, planned procedure, and the extremity.  A lateral incision was made after identifying under fluoroscopic imaging.  The landmarks identification of the site of the infection, distal femur and proximal to the tip of the stem as well as within the lateral bone most likely from previous allograft.  Utilizing the guide available on the system, under fluoroscopic imaging, I selected a 12-hole plate  initially.  My intention was to bypass the tip of the femoral stem to prevent a stress riser from the tip of the femoral stem and the lateral plate.  This 12-hole plate under fluoroscopic imaging did appear to bypass the appropriate depth.  At this point, a lateral based incision was made.  The iliotibial band was then split and the fracture site identified, hematoma evacuated.  The shaft portion of the fracture, just proximal to the fracture site identified and exposed.  I slid the plate submuscularly proximal.  Under fluoroscopic imaging, there was some concern about the orientation of the end of the plate to this  lateral bone structure.  Nonetheless, I first attended to attempting to place this plate after trying to identify and maintain the correct orientation of the distal femur, which was extremely difficult related to the significant comminution in the metaphyseal segment of femur.  I was able to get this plate secured distally.  My thought process by using 2 screws this distal location that I would be able to maneuver and control the distal segment, and then orient it to the shaft of the femur.  I reduced it and oriented to the shaft of the femur.  When I was intending to do this and  under fluoroscopic imaging, even with traction, was unable to anatomically reduce the fracture or maintain an anatomic reduction of the fracture while putting the plate along the lateral femur related to this heterotopic bone versus allograft on the lateral side of the femur from previous procedure; although, it was not an ideal situation from an expense standpoint, I did remove this plate and selected a 9-hole plate. In order to do this, I placed a guidewire back in where I placed the previous 2 distal screws, removed the screws and removed the plate.  I then slid the plate over this guidewire and then along the submuscular border of the femur.  I was able to better maintain reduction of both the distal metaphyseal fracture comminuted segment as well as reducing the distal portion to the shaft of the femur.  With a clamp in place, a plate clamp holding the femur to the plate, I reapplied the 2 screws distally using a Brooke Dare Kong clamp to reduce the plate to the bone distally.  These were locking screws.  Once this was done, I went ahead and applied 1 bicortical nonlocking screw proximally, and a second screw was utilized to place the bone, did not hold it, and this was eventually removed and exchanged for a locking screw.  I then placed 5 other additional locking screws bicortically to provide the best form of fixation.  Thus, I used 6 holes proximally.  Distally, I went ahead and placed two other screws to help support the distal fixation. Fluoroscopic imaging were utilized during the entire portion of the case, and at the end of the case for confirmation of reduction and maintenance of the fracture reduction as well as placement of the plate. The wound was irrigated with normal saline and solution.  I reapproximated the iliotibial band using a combination of #1 Vicryl and 0 V-Loc sutures.  The remaining wound was closed with 2-0 Vicryl.  The skin was closed with staples as was the site  medially where the [facility] clamp was used to hold the plate to the bone.  The entire thigh was then cleaned, dried, and dressed sterilely using Xeroform.  Sterile dressing and wrap was applied with Ace wrap over top. The knee placed in knee immobilizer.  Findings will be reviewed with the family as well as the postop weightbearing status and concerns.     Madlyn Frankel Charlann Boxer, M.D.     MDO/MEDQ  D:  12/13/2012  T:  12/14/2012  Job:  [id number]

## 2012-12-14 NOTE — Progress Notes (Signed)
Clinical Social Work Department CLINICAL SOCIAL WORK PLACEMENT NOTE 12/14/2012  Patient:  [name], [name]  Account Number:  [id number] Admit date:  12/11/2012  Clinical Social Worker:  Cori Razor, LCSW  Date/time:  12/14/2012 01:39 PM  Clinical Social Work is seeking post-discharge placement for this patient at the following level of care:   SKILLED NURSING   (*CSW will update this form in Epic as items are completed)     Patient/family provided with [facility] Department of Clinical Social Work's list of facilities offering this level of care within the geographic area requested by the patient (or if unable, by the patient's family).  12/14/2012  Patient/family informed of their freedom to choose among providers that offer the needed level of care, that participate in Medicare, Medicaid or managed care program needed by the patient, have an available bed and are willing to accept the patient.    Patient/family informed of [facility]' ownership interest in [facility], as well as of the fact that they are under no obligation to receive care at this facility.  PASARR submitted to [employer] on  PASARR number received from [employer] on 05/07/2010  FL2 transmitted to all facilities in geographic area requested by pt/family on  12/14/2012 FL2 transmitted to all facilities within larger geographic area on   Patient informed that his/her managed care company has contracts with or will negotiate with  certain facilities, including the following:     Patient/family informed of bed offers received:  12/14/2012 Patient chooses bed at [facility] Physician recommends and patient chooses bed at    Patient to be transferred to [facility] on   Patient to be transferred to facility by   The following physician request were entered in Epic:   Additional Comments:

## 2012-12-14 NOTE — Progress Notes (Signed)
[facility] PROGRESS NOTE  [name] YNW:[id number] DOB: 09/21/1915 DOA: 12/11/2012 PCP: Juline Patch, MD  Assessment/Plan:  1. L femur fracture -s/p ORIF -Physical therapy -DVT proph: SCDs, i stopped lovenox due to thrombocytopenia  2. HTN -BP stable -cut down IVF  3. Anemia -post op blood loss anemia -CBC in am, transfuse if trends down further   4. Thrombocytopenia -due to blood loss/consumption/lovenox -hold lovenox  DVT proph: SCDs   Code Status: DNR Family Communication: none at bedside Disposition Plan: SNF in 1-2days   Consultants:  ORtho Dr.Olin  Procedures:  ORIF 9/28  HPI/Subjective: Sleeping, denies any complaints Objective: Filed Vitals:   12/14/12 1058  BP: 96/48  Pulse: 54  Temp: 98 F (36.7 C)  Resp: 18    Intake/Output Summary (Last 24 hours) at 12/14/12 1445 Last data filed at 12/14/12 1241  Gross per 24 hour  Intake 3736.25 ml  Output   1325 ml  Net 2411.25 ml   Filed Weights   12/12/12 0357 12/13/12 1331  Weight: 63.504 kg (140 lb) 68.04 kg (150 lb)    Exam:   General:  Sleepy, arousible, answers questions appropriately when awoken  Cardiovascular: S1S2/RRR  Respiratory: CTAB  Abdomen: soft, Nt, BS present  Musculoskeletal:  L leg with dressing  Data Reviewed: Basic Metabolic Panel:  Recent Labs Lab 12/12/12 0020 12/13/12 0530 12/14/12 0415  NA 140 140 141  K 4.9 4.6 5.2*  CL 103 107 109  CO2 29 28 29   GLUCOSE 123* 133* 144*  BUN 32* 26* 21  CREATININE 1.07 0.87 0.86  CALCIUM 9.4 8.5 7.8*   Liver Function Tests:  Recent Labs Lab 12/13/12 0530  AST 26  ALT 20  ALKPHOS 65  BILITOT 0.4  PROT 5.0*  ALBUMIN 2.5*   No results found for this basename: LIPASE, AMYLASE,  in the last 168 hours No results found for this basename: AMMONIA,  in the last 168 hours CBC:  Recent Labs Lab 12/12/12 0020 12/13/12 0530 12/14/12 0415  WBC 6.1 7.1 9.7  NEUTROABS 2.8  --   --   HGB 11.5*  7.9* 8.3*  HCT 35.5* 24.5* 25.3*  MCV 103.2* 103.4* 97.3  PLT 160 111* 86*   Cardiac Enzymes: No results found for this basename: CKTOTAL, CKMB, CKMBINDEX, TROPONINI,  in the last 168 hours BNP (last 3 results) No results found for this basename: PROBNP,  in the last 8760 hours CBG: No results found for this basename: GLUCAP,  in the last 168 hours  Recent Results (from the past 240 hour(s))  SURGICAL PCR SCREEN     Status: None   Collection Time    12/12/12  3:57 AM      Result Value Range Status   MRSA, PCR NEGATIVE  NEGATIVE Final   Staphylococcus aureus NEGATIVE  NEGATIVE Final   Comment:            The Xpert SA Assay (FDA     approved for NASAL specimens     in patients over 77 years of age),     is one component of     a comprehensive surveillance     program.  Test performance has     been validated by [employer] for patients greater     than or equal to 77 year old.     It is not intended     to diagnose infection nor to     guide or monitor treatment.  Studies: Dg Femur Left  12/13/2012   CLINICAL DATA:  ORIF of a distal left femur fracture  EXAM: LEFT FEMUR - 2 VIEW  COMPARISON:  12/12/2012  FINDINGS: Four portable images show the placement of a fixation plate and screws reducing the major fracture fragments. There is mild residual anterior angulation. The orthopedic hardware is well seated and aligned. No evidence of an operative complication.  IMPRESSION: ORIF of a distal left femur fracture as described.   Electronically Signed   By: Amie Portland   On: 12/13/2012 17:36   Dg C-arm 61-120 Min-no Report  12/13/2012   CLINICAL DATA: left leg fracture   C-ARM 61-120 MINUTES  Fluoroscopy was utilized by the requesting physician.  No radiographic  interpretation.     Scheduled Meds: . antiseptic oral rinse  15 mL Mouth Rinse BID AC  . chlorhexidine  15 mL Mouth/Throat BID  . docusate sodium  100 mg Oral BID  . ferrous sulfate  325 mg Oral TID PC  .  pantoprazole  40 mg Oral Q1200  . sertraline  50 mg Oral Daily  . traMADol  50 mg Oral Q6H   Continuous Infusions:   Principal Problem:   Femur fracture Active Problems:   Anemia   Edema, lower extremity   Overweight (BMI 25.0-29.9)   Expected blood loss anemia    Time spent:    [name]  [facility] Pager [phone] If 7PM-7AM, please contact night-coverage at [email or web address], password [facility] 12/14/2012, 2:45 PM  LOS: 3 days

## 2012-12-14 NOTE — Progress Notes (Signed)
Clinical Social Work Department BRIEF PSYCHOSOCIAL ASSESSMENT 12/14/2012  Patient:  [name], [name]     Account Number:  [id number]     Admit date:  12/11/2012  Clinical Social Worker:  Candie Chroman  Date/Time:  12/14/2012 01:34 PM  Referred by:  Physician  Date Referred:  12/14/2012 Referred for  SNF Placement   Other Referral:   Interview type:  Patient Other interview type:    PSYCHOSOCIAL DATA Living Status:  FACILITY Admitted from facility:  [facility] Level of care:   Primary support name:  Liliane Shi Primary support relationship to patient:  CHILD, ADULT Degree of support available:   unclear    CURRENT CONCERNS Current Concerns  Post-Acute Placement   Other Concerns:    SOCIAL WORK ASSESSMENT / PLAN Pt is a 79 yr old female admitted from [facility]. CSW met with pt to assist with d/c planning. ST Rehab will be needed following hospital d/c. Pt has requested [facility] . SNF has been contacted and bed offer provided. CSW will continue to follow to assist with d/c planning to SNF.   Assessment/plan status:  Psychosocial Support/Ongoing Assessment of Needs Other assessment/ plan:   Information/referral to community resources:   None needed at this time.    PATIENT'S/FAMILY'S RESPONSE TO PLAN OF CARE: Pt requesting rehab at [facility].   Cori Razor LCSW [phone]

## 2012-12-14 NOTE — Evaluation (Signed)
Physical Therapy Evaluation Patient Details Name: [name] MRN: [id number] DOB: March 30, 1915 Today's Date: 12/14/2012 Time: 1478-2956 PT Time Calculation (min): 24 min  PT Assessment / Plan / Recommendation History of Present Illness  s/p ORIF comminuted distal fx  Clinical Impression  Pt will benefit from PT to address deficits below; It will be a longer rehab process as she is in a [facility] and [facility].  Pt is pleasant and cooperative although mildly anxious regarding mobility   PT Assessment  Patient needs continued PT services    Follow Up Recommendations  SNF    Does the patient have the potential to tolerate intense rehabilitation      Barriers to Discharge        Equipment Recommendations  None recommended by PT    Recommendations for Other Services     Frequency Min 3X/week    Precautions / Restrictions Precautions Precautions: Fall Required Braces or Orthoses: Knee Immobilizer - Left Knee Immobilizer - Left: On at all times   Pertinent Vitals/Pain Min c/o pain       Mobility  Bed Mobility Bed Mobility: Supine to Sit Supine to Sit: 1: +2 Total assist Supine to Sit: Patient Percentage: 30% Details for Bed Mobility Assistance: +2  for UB, LLE, bed pad used to scoot pt lateraaly in supine; multi-modal cues for alll aspects of transition Transfers Transfers: Pharmacologist Details for Transfer Assistance: partial stand pivot with +2 assist for pivot and second person to support LLE, going towards  pt right side    Exercises General Exercises - Lower Extremity Ankle Circles/Pumps: AROM;AAROM;Both;5 reps   PT Diagnosis: Generalized weakness;Difficulty walking  PT Problem List: Decreased strength;Decreased activity tolerance;Decreased balance;Decreased mobility;Decreased knowledge of use of DME;Decreased knowledge of precautions PT Treatment Interventions: DME instruction;Functional mobility training;Therapeutic activities;Therapeutic exercise;Patient/family  education     PT Goals(Current goals can be found in the care plan section) Acute Rehab PT Goals Patient Stated Goal: to go to rehab PT Goal Formulation: With patient Time For Goal Achievement: 12/21/12 Potential to Achieve Goals: Good  Visit Information  Last PT Received On: 12/14/12 Assistance Needed: +2 History of Present Illness: s/p ORIF comminuted distal fx       Prior Functioning  Home Living Family/patient expects to be discharged to:: Skilled nursing facility Additional Comments: pt resides at [facility]; Amb with a RW at baseline Communication Communication: No difficulties    Cognition  Cognition Arousal/Alertness: Awake/alert Overall Cognitive Status: Within Functional Limits for tasks assessed    Extremity/Trunk Assessment Upper Extremity Assessment Upper Extremity Assessment: Generalized weakness Lower Extremity Assessment Lower Extremity Assessment: LLE deficits/detail LLE: Unable to fully assess due to pain;Unable to fully assess due to immobilization   Balance Static Sitting Balance Static Sitting - Balance Support: Bilateral upper extremity supported;No upper extremity supported (right foot on floor) Static Sitting - Level of Assistance: 5: Stand by assistance;4: Min assist;3: Mod assist (balance improved with time)  End of Session PT - End of Session Equipment Utilized During Treatment: Gait belt Activity Tolerance: Patient tolerated treatment well Patient left: in chair;with call bell/phone within reach;with family/visitor present Nurse Communication: Mobility status;Need for lift equipment (maxi-move for back to bed)  GP     [facility] 12/14/2012, 1:19 PM

## 2012-12-14 NOTE — Progress Notes (Deleted)
Clinical Social Work Department BRIEF PSYCHOSOCIAL ASSESSMENT 12/14/2012  Patient:  [name],[name]     Account Number:  [id number]     Admit date:  12/11/2012  Clinical Social Worker:  Candie Chroman  Date/Time:  12/14/2012 01:11 PM  Referred by:  Physician  Date Referred:  12/14/2012 Referred for  SNF Placement   Other Referral:   Interview type:  Patient Other interview type:    PSYCHOSOCIAL DATA Living Status:  FACILITY Admitted from facility:  [facility] Level of care:  Independent Living Primary support name:  Nathalia Wismer Primary support relationship to patient:  CHILD, ADULT Degree of support available:   unclear    CURRENT CONCERNS Current Concerns  Post-Acute Placement   Other Concerns:    SOCIAL WORK ASSESSMENT / PLAN Pt is a 79 yr old female admitted from [facility]. CSW met with pt to assist with d/c planning. ST Rehab will be needed following hospital d/c. Pt has requested [facility] . CSW has contacted SNF and a decision is pending. SNF search has been initiated to provide other options if needed.   Assessment/plan status:  Psychosocial Support/Ongoing Assessment of Needs Other assessment/ plan:   Information/referral to community resources:   SNF list with bed offers to be provided.    PATIENT'S/FAMILY'S RESPONSE TO PLAN OF CARE: Pt would like to have rehab at [facility].   Cori Razor LCSW [phone]

## 2012-12-14 NOTE — Clinical Documentation Improvement (Signed)
THIS DOCUMENT IS NOT A PERMANENT PART OF THE MEDICAL RECORD  Please update your documentation with the medical record to reflect your response to this query. If you need help knowing how to do this please call [phone].  12/14/12  Dear tthew Humberto Leep, PA-C  [facility]  In an effort to better capture your patient's severity of illness, reflect appropriate length of stay and utilization of resources, a review of the patient medical record has revealed the following indicators.    Based on your clinical judgment, please clarify and document in a progress note and/or discharge summary the clinical condition associated with the following supporting information:  In responding to this query please exercise your independent judgment.  The fact that a query is asked, does not imply that any particular answer is desired or expected.  Pt with anemia per ED note  Clarification Needed   Please clarify if anemia s/p ORIF ISTAL FEMUR FRACTURE (Left) can be further specified as one of the diagnoses listed below and document in pn or d/c summary.    Possible Clinical Conditions?   " Expected Acute Blood Loss Anemia  " Acute Blood Loss Anemia  " Acute on chronic blood loss anemia    " Other Condition________________  " Cannot Clinically Determine  Risk Factors: (recent surgery, pre op anemia, EBL in OR)  Supporting Information:  Signs and Symptoms   ORIF ISTAL FEMUR FRACTURE (Left)  Diagnostics: Component      Hemoglobin HCT  Latest Ref Rng      12.0 - 15.0 g/dL 09.8 - 11.9 %  1/47/8295     5:30 AM 7.9 (L) 24.5 (L)  12/14/2012      8.3 (L) 25.3 (L)   Treatments: Transfusion: Transfuse PRBCs ferrous sulfate tablet 325 mg      Reviewed:  no additional documentation provided,   WAS ALREADY DOCUMENTED!!!  Thank You,  Enis Slipper  RN, BSN, MSN/Inf, CCDS Clinical Documentation Specialist [facility] Dept Pager: [phone] / E-mail:  [email or web address]  [phone] Health Information Management [place]

## 2012-12-14 NOTE — Progress Notes (Deleted)
Clinical Social Work Department CLINICAL SOCIAL WORK PLACEMENT NOTE 12/14/2012  Patient:  [name],[name]  Account Number:  [id number] Admit date:  12/11/2012  Clinical Social Worker:  Cori Razor, LCSW  Date/time:  12/14/2012 01:19 PM  Clinical Social Work is seeking post-discharge placement for this patient at the following level of care:   SKILLED NURSING   (*CSW will update this form in Epic as items are completed)     Patient/family provided with [facility] Department of Clinical Social Work's list of facilities offering this level of care within the geographic area requested by the patient (or if unable, by the patient's family).    Patient/family informed of their freedom to choose among providers that offer the needed level of care, that participate in Medicare, Medicaid or managed care program needed by the patient, have an available bed and are willing to accept the patient.    Patient/family informed of [facility]' ownership interest in [facility], as well as of the fact that they are under no obligation to receive care at this facility.  PASARR submitted to EDS on  PASARR number received from EDS on 05/07/2010  FL2 transmitted to all facilities in geographic area requested by pt/family on  12/14/2012 FL2 transmitted to all facilities within larger geographic area on   Patient informed that his/her managed care company has contracts with or will negotiate with  certain facilities, including the following:     Patient/family informed of bed offers received:   Patient chooses bed at  Physician recommends and patient chooses bed at    Patient to be transferred to  on   Patient to be transferred to facility by   The following physician request were entered in Epic:   Additional Comments:  Cori Razor LCSW [phone]

## 2012-12-14 NOTE — Progress Notes (Signed)
   Subjective: 1 Day Post-Op Procedure(s) (LRB): OPEN REDUCTION INTERNAL FIXATION (ORIF) DISTAL FEMUR FRACTURE (Left)   Patient reports pain as mild, is was laying comfortably in her bed this morning. She is moving around in the bed from the waist up, and not complaining of any pain. BP ran a little low last night and fluids were increased slightly. Appears that BP is more normal this morning. No other events throughout the night.  Objective:   VITALS:   Filed Vitals:   12/14/12 0615  BP: 114/67  Pulse: 87  Temp: 98.8 F (37.1 C)  Resp: 16    Neurovascular intact Dorsiflexion/Plantar flexion intact Incision: dressing C/D/I No cellulitis present Compartment soft  LABS  Recent Labs  12/12/12 0020 12/13/12 0530 12/14/12 0415  HGB 11.5* 7.9* 8.3*  HCT 35.5* 24.5* 25.3*  WBC 6.1 7.1 9.7  PLT 160 111* 86*     Recent Labs  12/12/12 0020 12/13/12 0530 12/14/12 0415  NA 140 140 141  K 4.9 4.6 5.2*  BUN 32* 26* 21  CREATININE 1.07 0.87 0.86  GLUCOSE 123* 133* 144*     Assessment/Plan: 1 Day Post-Op Procedure(s) (LRB): OPEN REDUCTION INTERNAL FIXATION (ORIF) DISTAL FEMUR FRACTURE (Left) Advance diet Up with therapy D/C IV fluids Discharge to SNF eventually, when ready  Expected ABLA  Treated with iron and will observe  Overweight (BMI 25-29.9) Estimated body mass index is 28.36 kg/(m^2) as calculated from the following:   Height as of this encounter: 5\' 1"  (1.549 m).   Weight as of this encounter: 68.04 kg (150 lb). Patient also counseled that weight may inhibit the healing process Patient counseled that losing weight will help with future health issues       Anastasio Auerbach. Jarian Longoria   PAC  12/14/2012, 7:38 AM

## 2012-12-14 NOTE — Progress Notes (Signed)
[facility]  NAME:  MURL, ZOGG                   ACCOUNT NO.:  MEDICAL RECORD NO.:  [id number]      DATE OF BIRTH:  April 06, 1915  PHYSICIAN:  Ardath Sax, M.D.           VISIT DATE:                                  OFFICE VISIT   This is an alert 79 year old lady who lives in a nursing facility.  She comes in with a 1 cm sore in her right hip.  Apparently, she had some sort of infection there that was treated with doxycycline and it has not healed since presumably because of pressure related etiologies.  She has a history of coronary artery disease, diverticulitis, osteoporosis, spinal stenosis, a cystocele, and a detached retina.  She also had a T8 compression fracture.  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  She is found to have a blood pressure of 147/75, respirations 17, pulse 80, temperature 98.3.  She weighs 148 pounds. She was examined today and really the only area of concern is this 1 cm reddened area that looks like dermis is intact.  It looks like she had an infection and it drained.  There is no cellulitis around it.  It is below the scar of one of her hip replacements.  I am just going to treat it with silver alginate dressings and have her come back in a week.  So her diagnosis is history of coronary artery disease, osteoporosis, also history of spinal stenosis and a compression fracture of her spine and this diagnosis is a wound on her right hip which is superficial and quite small that we were going to treat with silver alginate.     Ardath Sax, M.D.     PP/MEDQ  D:  12/11/2012  T:  12/12/2012  Job:  [id number]

## 2012-12-15 ENCOUNTER — Encounter (HOSPITAL_COMMUNITY): Payer: Self-pay | Admitting: Orthopedic Surgery

## 2012-12-15 ENCOUNTER — Inpatient Hospital Stay (HOSPITAL_COMMUNITY): Payer: Medicare Other

## 2012-12-15 DIAGNOSIS — N179 Acute kidney failure, unspecified: Secondary | ICD-10-CM | POA: Diagnosis not present

## 2012-12-15 DIAGNOSIS — D500 Iron deficiency anemia secondary to blood loss (chronic): Secondary | ICD-10-CM

## 2012-12-15 DIAGNOSIS — E875 Hyperkalemia: Secondary | ICD-10-CM | POA: Diagnosis not present

## 2012-12-15 LAB — BASIC METABOLIC PANEL
CO2: 25 mEq/L (ref 19–32)
Creatinine, Ser: 1.51 mg/dL — ABNORMAL HIGH (ref 0.50–1.10)
GFR calc non Af Amer: 28 mL/min — ABNORMAL LOW (ref >90)
Glucose, Bld: 142 mg/dL — ABNORMAL HIGH (ref 70–99)
Potassium: 5.6 mEq/L — ABNORMAL HIGH (ref 3.5–5.1)
Sodium: 136 mEq/L (ref 135–145)

## 2012-12-15 LAB — CBC
HCT: 26.4 % — ABNORMAL LOW (ref 36.0–46.0)
Hemoglobin: 8.5 g/dL — ABNORMAL LOW (ref 12.0–15.0)
Platelets: 121 10*3/uL — ABNORMAL LOW (ref 150–400)
RBC: 2.67 MIL/uL — ABNORMAL LOW (ref 3.87–5.11)
WBC: 12.1 10*3/uL — ABNORMAL HIGH (ref 4.0–10.5)

## 2012-12-15 MED ORDER — SODIUM POLYSTYRENE SULFONATE 15 GM/60ML PO SUSP
15.0000 g | Freq: Once | ORAL | Status: AC
  Administered 2012-12-15: 14:00:00 15 g via ORAL
  Filled 2012-12-15: qty 60

## 2012-12-15 NOTE — Progress Notes (Signed)
Physical Therapy Treatment Patient Details Name: [name] MRN: [id number] DOB: July 03, 1915 Today's Date: 12/15/2012 Time: 4098-1191 PT Time Calculation (min): 27 min  PT Assessment / Plan / Recommendation  History of Present Illness s/p ORIF comminuted distal fx   PT Comments   Assisted NT with bed mobility for hygiene then assisted pt OOB to recliner.  Pt required increased time for position transition and positioning.    Follow Up Recommendations  SNF     Does the patient have the potential to tolerate intense rehabilitation     Barriers to Discharge        Equipment Recommendations       Recommendations for Other Services    Frequency Min 3X/week   Progress towards PT Goals Progress towards PT goals: Progressing toward goals  Plan      Precautions / Restrictions Precautions Precautions: Fall Precaution Comments: NO knee ROM Required Braces or Orthoses: Knee Immobilizer - Left Knee Immobilizer - Left: On at all times Restrictions Weight Bearing Restrictions: Yes Other Position/Activity Restrictions: NWB    Pertinent Vitals/Pain C/o pain during transfer but unable to rate    Mobility  Bed Mobility Bed Mobility: Supine to Sit Supine to Sit: 1: +2 Total assist Supine to Sit: Patient Percentage: 30% Details for Bed Mobility Assistance: +2  for UB, LLE, bed pad used to scoot pt lateraaly in supine; multi-modal cues for alll aspects of transition Transfers Transfers: Water engineer Transfers Details for Transfer Assistance: partial stand pivot with +2 assist for pivot and second person to support LLE, going towards  pt right side from bed to recliner     PT Goals (current goals can now be found in the care plan section)    Visit Information  Last PT Received On: 12/15/12 Assistance Needed: +2 History of Present Illness: s/p ORIF comminuted distal fx    Subjective Data      Cognition       Balance     End of Session PT - End of Session Equipment  Utilized During Treatment: Gait belt Activity Tolerance: Patient tolerated treatment well Patient left: in chair;with call bell/phone within reach;with family/visitor present Nurse Communication: Mobility status;Need for lift equipment   Felecia Shelling  PTA [facility]  Acute  Rehab Pager      [phone]

## 2012-12-15 NOTE — Care Management Note (Addendum)
    Page 1 of 1   12/15/2012     2:07:27 PM   CARE MANAGEMENT NOTE 12/15/2012  Patient:  [name], [name]   Account Number:  [id number]  Date Initiated:  12/15/2012  Documentation initiated by:  Colleen Can  Subjective/Objective Assessment:   dx comminuted distal femur fracture-left; ORIF     Action/Plan:   Plans are for SNF rehab   Anticipated DC Date:  12/18/2012   Anticipated DC Plan:  SKILLED NURSING FACILITY  In-house referral  Clinical Social Worker      DC Planning Services  CM consult      Choice offered to / List presented to:             Status of service:  Completed, signed off Medicare Important Message given?   (If response is "NO", the following Medicare IM given date fields will be blank) Date Medicare IM given:   Date Additional Medicare IM given:    Discharge Disposition:    Per UR Regulation:    If discussed at Long Length of Stay Meetings, dates discussed:    Comments:

## 2012-12-15 NOTE — Progress Notes (Signed)
   Subjective: 2 Days Post-Op Procedure(s) (LRB): OPEN REDUCTION INTERNAL FIXATION (ORIF) DISTAL FEMUR FRACTURE (Left)   Patient reports pain as mild, pain controlled. Resting comfortably in bed this morning. Events during late yesterday and last night including choking on foods.  Objective:   VITALS:   Filed Vitals:   12/15/12 1324  BP: 101/62  Pulse: 84  Temp: 97.9 F (36.6 C)  Resp: 16    Neurovascular intact Dorsiflexion/Plantar flexion intact Incision: dressing C/D/I No cellulitis present Compartment soft  LABS  Recent Labs  12/13/12 0530 12/14/12 0415 12/15/12 0415  HGB 7.9* 8.3* 8.5*  HCT 24.5* 25.3* 26.4*  WBC 7.1 9.7 12.1*  PLT 111* 86* 121*     Recent Labs  12/14/12 0415 12/14/12 1420 12/15/12 0415  NA 141 137 136  K 5.2* 4.4 5.6*  BUN 21 22 35*  CREATININE 0.86 0.81 1.51*  GLUCOSE 144* 182* 142*     Assessment/Plan: 2 Days Post-Op Procedure(s) (LRB): OPEN REDUCTION INTERNAL FIXATION (ORIF) DISTAL FEMUR FRACTURE (Left)  Up with therapy Discharge to SNF eventually, when ready. Swallow study ordered as well as a CXR.   Anastasio Auerbach Bergen Magner   PAC  12/15/2012, 5:34 PM

## 2012-12-15 NOTE — Progress Notes (Signed)
OT Cancellation Note  Patient Details Name: [name] MRN: [id number] DOB: May 27, 1915   Cancelled Treatment:    Reason Eval/Treat Not Completed: OT screened. Defer to next venue.   Lennox Laity [phone] 12/15/2012, 8:42 AM

## 2012-12-15 NOTE — Progress Notes (Signed)
[facility] PROGRESS NOTE  [name] ZOX:[id number] DOB: [birth year] DOA: 12/11/2012 PCP: Juline Patch, MD  Assessment/Plan:  1. L femur fracture -s/p ORIF -Physical therapy -DVT proph: SCDs, i stopped lovenox due to thrombocytopenia  2. HTN -BP soft yesterday, will keep on IVF  3. Acute Renal failure -suspect due to hypotension/soft BP yesterday,  -increase IVF today, BP better -urine output good -bmet in am  4. Hyperkalemia -due to ARF, kayexalate x1 now -repeat Bmet  5. Anemia -post op blood loss anemia -CBC in am, transfuse if trends down further   6. Thrombocytopenia -due to blood loss/consumption/lovenox -hold lovenox, if plts trend up again, could reattempt lovenox  DVT proph: SCDs   Code Status: DNR Family Communication: none at bedside Disposition Plan: SNF in 1-2days   Consultants:  ORtho Dr.Olin  Procedures:  ORIF 9/28  HPI/Subjective: Sleeping, denies any complaints Objective: Filed Vitals:   12/15/12 0800  BP:   Pulse:   Temp:   Resp: 14    Intake/Output Summary (Last 24 hours) at 12/15/12 1035 Last data filed at 12/15/12 0646  Gross per 24 hour  Intake 1111.25 ml  Output    450 ml  Net 661.25 ml   Filed Weights   12/12/12 0357 12/13/12 1331  Weight: 63.504 kg (140 lb) 68.04 kg (150 lb)    Exam:   General:  Sleepy, arousible, answers questions appropriately when awoken  Cardiovascular: S1S2/RRR  Respiratory: CTAB  Abdomen: soft, Nt, BS present  Musculoskeletal:  L leg with dressing  Data Reviewed: Basic Metabolic Panel:  Recent Labs Lab 12/12/12 0020 12/13/12 0530 12/14/12 0415 12/14/12 1420 12/15/12 0415  NA 140 140 141 137 136  K 4.9 4.6 5.2* 4.4 5.6*  CL 103 107 109 105 104  CO2 29 28 29 24 25   GLUCOSE 123* 133* 144* 182* 142*  BUN 32* 26* 21 22 35*  CREATININE 1.07 0.87 0.86 0.81 1.51*  CALCIUM 9.4 8.5 7.8* 7.9* 8.2*   Liver Function Tests:  Recent Labs Lab 12/13/12 0530  AST 26   ALT 20  ALKPHOS 65  BILITOT 0.4  PROT 5.0*  ALBUMIN 2.5*   No results found for this basename: LIPASE, AMYLASE,  in the last 168 hours No results found for this basename: AMMONIA,  in the last 168 hours CBC:  Recent Labs Lab 12/12/12 0020 12/13/12 0530 12/14/12 0415 12/15/12 0415  WBC 6.1 7.1 9.7 12.1*  NEUTROABS 2.8  --   --   --   HGB 11.5* 7.9* 8.3* 8.5*  HCT 35.5* 24.5* 25.3* 26.4*  MCV 103.2* 103.4* 97.3 98.9  PLT 160 111* 86* 121*   Cardiac Enzymes: No results found for this basename: CKTOTAL, CKMB, CKMBINDEX, TROPONINI,  in the last 168 hours BNP (last 3 results) No results found for this basename: PROBNP,  in the last 8760 hours CBG: No results found for this basename: GLUCAP,  in the last 168 hours  Recent Results (from the past 240 hour(s))  SURGICAL PCR SCREEN     Status: None   Collection Time    12/12/12  3:57 AM      Result Value Range Status   MRSA, PCR NEGATIVE  NEGATIVE Final   Staphylococcus aureus NEGATIVE  NEGATIVE Final   Comment:            The Xpert SA Assay (FDA     approved for NASAL specimens     in patients over 59 years of age),     is one component  of     a comprehensive surveillance     program.  Test performance has     been validated by [facility] for patients greater     than or equal to 70 year old.     It is not intended     to diagnose infection nor to     guide or monitor treatment.     Studies: Dg Femur Left  12/13/2012   CLINICAL DATA:  ORIF of a distal left femur fracture  EXAM: LEFT FEMUR - 2 VIEW  COMPARISON:  12/12/2012  FINDINGS: Four portable images show the placement of a fixation plate and screws reducing the major fracture fragments. There is mild residual anterior angulation. The orthopedic hardware is well seated and aligned. No evidence of an operative complication.  IMPRESSION: ORIF of a distal left femur fracture as described.   Electronically Signed   By: Amie Portland   On: 12/13/2012 17:36   Dg  Chest Port 1 View  12/15/2012   CLINICAL DATA:  Shortness of breath. Aspiration.  EXAM: PORTABLE CHEST - 1 VIEW  COMPARISON:  Chest x-ray dated 12/12/2012  FINDINGS: There is new pulmonary vascular congestion as well as focal atelectasis at the left lung base. No effusions. No acute osseous abnormality. Thoracic compression fractures are noted.  IMPRESSION: New focal atelectasis at the left lung base. New slight pulmonary vascular congestion.   Electronically Signed   By: Geanie Cooley   On: 12/15/2012 09:58   Dg C-arm 61-120 Min-no Report  12/13/2012   CLINICAL DATA: left leg fracture   C-ARM 61-120 MINUTES  Fluoroscopy was utilized by the requesting physician.  No radiographic  interpretation.     Scheduled Meds: . antiseptic oral rinse  15 mL Mouth Rinse BID AC  . chlorhexidine  15 mL Mouth/Throat BID  . docusate sodium  100 mg Oral BID  . ferrous sulfate  325 mg Oral TID PC  . pantoprazole  40 mg Oral Q1200  . sertraline  50 mg Oral Daily  . sodium polystyrene  15 g Oral Once  . traMADol  50 mg Oral Q6H   Continuous Infusions: . sodium chloride 100 mL/hr at 12/15/12 1024    Principal Problem:   Femur fracture Active Problems:   Anemia   Edema, lower extremity   Overweight (BMI 25.0-29.9)   Expected blood loss anemia    Time spent:    Crosby Bevan  [facility] Pager [phone] If 7PM-7AM, please contact night-coverage at [email or web address], password [facility] 12/15/2012, 10:35 AM  LOS: 4 days

## 2012-12-16 ENCOUNTER — Inpatient Hospital Stay (HOSPITAL_COMMUNITY): Payer: Medicare Other

## 2012-12-16 DIAGNOSIS — K219 Gastro-esophageal reflux disease without esophagitis: Secondary | ICD-10-CM

## 2012-12-16 DIAGNOSIS — R109 Unspecified abdominal pain: Secondary | ICD-10-CM

## 2012-12-16 LAB — CBC
Hemoglobin: 7.1 g/dL — ABNORMAL LOW (ref 12.0–15.0)
MCH: 31.1 pg (ref 26.0–34.0)
MCV: 99.1 fL (ref 78.0–100.0)
RBC: 2.28 MIL/uL — ABNORMAL LOW (ref 3.87–5.11)
RDW: 16.4 % — ABNORMAL HIGH (ref 11.5–15.5)
WBC: 7.8 10*3/uL (ref 4.0–10.5)

## 2012-12-16 LAB — BASIC METABOLIC PANEL
CO2: 25 mEq/L (ref 19–32)
Calcium: 8 mg/dL — ABNORMAL LOW (ref 8.4–10.5)
Chloride: 105 mEq/L (ref 96–112)
GFR calc non Af Amer: 47 mL/min — ABNORMAL LOW (ref >90)
Glucose, Bld: 142 mg/dL — ABNORMAL HIGH (ref 70–99)
Potassium: 4.4 mEq/L (ref 3.5–5.1)
Sodium: 136 mEq/L (ref 135–145)

## 2012-12-16 LAB — TYPE AND SCREEN
ABO/RH(D): A NEG
Antibody Screen: NEGATIVE
Unit division: 0
Unit division: 0

## 2012-12-16 LAB — PREPARE RBC (CROSSMATCH)

## 2012-12-16 MED ORDER — FUROSEMIDE 10 MG/ML IJ SOLN
10.0000 mg | Freq: Once | INTRAMUSCULAR | Status: AC
  Administered 2012-12-16: 19:00:00 10 mg via INTRAVENOUS
  Filled 2012-12-16: qty 1

## 2012-12-16 MED ORDER — RESOURCE THICKENUP CLEAR PO POWD
ORAL | Status: DC | PRN
  Filled 2012-12-16: qty 125

## 2012-12-16 MED ORDER — FUROSEMIDE 10 MG/ML IJ SOLN
10.0000 mg | Freq: Once | INTRAMUSCULAR | Status: AC
  Administered 2012-12-16: 10 mg via INTRAVENOUS
  Filled 2012-12-16: qty 1

## 2012-12-16 NOTE — Progress Notes (Signed)
Hearing aid from right ear given to son (POA) per his request

## 2012-12-16 NOTE — Progress Notes (Signed)
[facility] PROGRESS NOTE  [name] ZOX:[id number] DOB: [birth year] DOA: 12/11/2012 PCP: Juline Patch, MD  Assessment/Plan:  1. L femur fracture -s/p ORIF -Physical therapy -DVT proph: SCDs, i stopped lovenox due to thrombocytopenia  2. HTN -BP soft yesterday, will keep on IVF  3. Acute Renal failure -suspect due to hypotension/soft BP 9/29,  -decrease IVF today, BP remaining stable today -urine output good -resolved, cr normalized at 0.99 today 10/1  4. Hyperkalemia -due to ARF, s/p kayexalate on 9/30 -resolved  5. Anemia -post op blood loss anemia -agree with transfusion per primary team, follow  6. Thrombocytopenia -due to blood loss/consumption/lovenox -hold lovenox, if plts trend up again, could reattempt lovenox  DVT proph: SCDs   Code Status: DNR Family Communication: none at bedside Disposition Plan: per primary team    Procedures:  ORIF 9/28  HPI/Subjective: She is alert and oriented x3,  denies any complaints Objective: Filed Vitals:   12/16/12 1402  BP: 99/53  Pulse: 80  Temp: 98.3 F (36.8 C)  Resp: 16    Intake/Output Summary (Last 24 hours) at 12/16/12 1434 Last data filed at 12/16/12 1300  Gross per 24 hour  Intake 2628.33 ml  Output   1225 ml  Net 1403.33 ml   Filed Weights   12/12/12 0357 12/13/12 1331  Weight: 63.504 kg (140 lb) 68.04 kg (150 lb)    Exam:   General:  Sleepy, arousible, answers questions appropriately when awoken  Cardiovascular: S1S2/RRR  Respiratory: CTAB  Abdomen: soft, Nt, BS present  Musculoskeletal:  L leg with dressing  Data Reviewed: Basic Metabolic Panel:  Recent Labs Lab 12/13/12 0530 12/14/12 0415 12/14/12 1420 12/15/12 0415 12/16/12 0415  NA 140 141 137 136 136  K 4.6 5.2* 4.4 5.6* 4.4  CL 107 109 105 104 105  CO2 28 29 24 25 25   GLUCOSE 133* 144* 182* 142* 142*  BUN 26* 21 22 35* 30*  CREATININE 0.87 0.86 0.81 1.51* 0.99  CALCIUM 8.5 7.8* 7.9* 8.2* 8.0*    Liver Function Tests:  Recent Labs Lab 12/13/12 0530  AST 26  ALT 20  ALKPHOS 65  BILITOT 0.4  PROT 5.0*  ALBUMIN 2.5*   No results found for this basename: LIPASE, AMYLASE,  in the last 168 hours No results found for this basename: AMMONIA,  in the last 168 hours CBC:  Recent Labs Lab 12/12/12 0020 12/13/12 0530 12/14/12 0415 12/15/12 0415 12/16/12 0415  WBC 6.1 7.1 9.7 12.1* 7.8  NEUTROABS 2.8  --   --   --   --   HGB 11.5* 7.9* 8.3* 8.5* 7.1*  HCT 35.5* 24.5* 25.3* 26.4* 22.6*  MCV 103.2* 103.4* 97.3 98.9 99.1  PLT 160 111* 86* 121* 97*   Cardiac Enzymes: No results found for this basename: CKTOTAL, CKMB, CKMBINDEX, TROPONINI,  in the last 168 hours BNP (last 3 results) No results found for this basename: PROBNP,  in the last 8760 hours CBG: No results found for this basename: GLUCAP,  in the last 168 hours  Recent Results (from the past 240 hour(s))  SURGICAL PCR SCREEN     Status: None   Collection Time    12/12/12  3:57 AM      Result Value Range Status   MRSA, PCR NEGATIVE  NEGATIVE Final   Staphylococcus aureus NEGATIVE  NEGATIVE Final   Comment:            The Xpert SA Assay (FDA     approved for NASAL specimens  in patients over 92 years of age),     is one component of     a comprehensive surveillance     program.  Test performance has     been validated by [employer] for patients greater     than or equal to 49 year old.     It is not intended     to diagnose infection nor to     guide or monitor treatment.     Studies: Dg Chest Port 1 View  12/15/2012   CLINICAL DATA:  Shortness of breath. Aspiration.  EXAM: PORTABLE CHEST - 1 VIEW  COMPARISON:  Chest x-ray dated 12/12/2012  FINDINGS: There is new pulmonary vascular congestion as well as focal atelectasis at the left lung base. No effusions. No acute osseous abnormality. Thoracic compression fractures are noted.  IMPRESSION: New focal atelectasis at the left lung base. New slight  pulmonary vascular congestion.   Electronically Signed   By: Geanie Cooley   On: 12/15/2012 09:58   Dg Swallowing Func-speech Pathology  12/16/2012   [name], [name]     12/16/2012 12:57 PM Objective Swallowing Evaluation: Modified Barium Swallowing Study   Patient Details  Name: [name] MRN: [id number] Date of Birth: 1915/03/20  Today's Date: 12/16/2012 Time: 1130-1200 SLP Time Calculation (min): 30 min  Past Medical History:  Past Medical History  Diagnosis Date  . Arthritis   . Coronary artery disease    Past Surgical History:  Past Surgical History  Procedure Laterality Date  . Hip fracture surgery      x 3  . Orif femur fracture Left 12/13/2012    Procedure: OPEN REDUCTION INTERNAL FIXATION (ORIF) DISTAL FEMUR  FRACTURE;  Surgeon: [name], MD;  Location: [facility];   Service: Orthopedics;  Laterality: Left;   HPI:  79 year old female admitted after falling and fracturing her left  femur.  Now s/p ORIF.  RN reports pt. with significant dysphagia,  now with coughing/choking during meals. Son at bedside, questions  if pt. may have had a stroke, stating she has been more confused,  has not f/c's well, and is having trouble lip reading, in  addition to her swallowing difficulty.  D/C to SNF pending.     Assessment / Plan / Recommendation Clinical Impression  Dysphagia Diagnosis: Moderate pharyngeal phase  dysphagia;Suspected primary esophageal dysphagia Clinical impression: Pt. presents with a moderate pharyngeal and  esophageal dysphagia (questionable primary esophageal dysphagia  that is impacting the pharyngeal phase).  This is characterized  by a delayed swallow with food/liquid reaching the level of the  valleculae (and pyriforms with liquids) prior to swallow  initiation.  There is decreased base of tongue contraction to the  posterior pharyngeal wall, resulting in stasis with purees and  mechanical consistencies on the post. pharyngeal wall, as well as  residue in the valleculae and  pyriforms.  Nectar thick liquids  penetrated to the level of the cords when given by spoon (no  cough response), and nectar was aspirated with cup sip (positive  cough, but did not clear aspirate).  Pt. was unable to f/c's  consistently for use of chin tuck, but did follow "swallow again"  which decreased the amount of laryngeal residue.  The esophageal  sweep revealed what appeared to be significant stasis throughout  the distal esophagus, with a corkscrew type appearance.  Most of  the esophageal stasis cleared after a few minutes.  No  radiologist was present to confirm esophageal observations.    Treatment Recommendation  F/U MBS in ___ days (Comment) (10)    Diet Recommendation Dysphagia 2 (Fine chop);Honey-thick liquid   Liquid Administration via: Cup Medication Administration: Crushed with puree Supervision: Full supervision/cueing for compensatory strategies Compensations: Slow rate;Small sips/bites;Multiple dry swallows  after each bite/sip;Follow solids with liquid Postural Changes and/or Swallow Maneuvers: Seated upright 90  degrees;Upright 30-60 min after meal    Other  Recommendations Recommended Consults: Consider GI  evaluation;Consider esophageal assessment Oral Care Recommendations: Oral care QID Other Recommendations: Order thickener from pharmacy;Have oral  suction available;Clarify dietary restrictions   Follow Up Recommendations  Skilled Nursing facility    Frequency and Duration        Pertinent Vitals/Pain n/a    SLP Swallow Goals Patient will consume recommended diet without observed clinical  signs of aspiration with: Moderate assistance Patient will utilize recommended strategies during swallow to  increase swallowing safety with: Moderate assistance   General HPI: 79 year old female admitted after falling and  fracturing her left femur.  Now s/p ORIF.  RN reports pt. with  significant dysphagia, now with coughing/choking during meals.  Son at bedside, questions if pt. may have had a  stroke, stating  she has been more confused, has not f/c's well, and is having  trouble lip reading, in addition to her swallowing difficulty.   D/C to SNF pending. Type of Study: Modified Barium Swallowing Study Reason for Referral: Objectively evaluate swallowing function Previous Swallow Assessment: None in [facility] system Diet Prior to this Study: Dysphagia 3 (soft);Thin liquids Temperature Spikes Noted: Yes (low grade) Respiratory Status: Room air History of Recent Intubation: Yes Length of Intubations (days): 1 days (for surgery) Date extubated: 12/13/12 Behavior/Cognition:  Alert;Cooperative;Confused;Distractible;Requires cueing;Decreased  sustained attention;Hard of hearing Oral Cavity - Dentition: Adequate natural dentition Oral Motor / Sensory Function: Within functional limits Self-Feeding Abilities: Needs assist Patient Positioning: Upright in chair Baseline Vocal Quality: Clear Volitional Cough: Weak Volitional Swallow: Unable to elicit Anatomy: Within functional limits Pharyngeal Secretions: Not observed secondary MBS    Reason for Referral Objectively evaluate swallowing function   Oral Phase Oral Preparation/Oral Phase Oral Phase: WFL   Pharyngeal Phase Pharyngeal Phase Pharyngeal Phase: Impaired Pharyngeal - Pudding Pharyngeal - Pudding Teaspoon: Delayed swallow  initiation;Premature spillage to valleculae;Reduced pharyngeal  peristalsis;Reduced epiglottic inversion;Reduced anterior  laryngeal mobility;Reduced laryngeal elevation;Reduced tongue  base retraction;Pharyngeal residue - valleculae;Pharyngeal  residue - pyriform sinuses;Pharyngeal residue - posterior pharnyx Pharyngeal - Honey Pharyngeal - Honey Cup: Delayed swallow initiation;Premature  spillage to valleculae;Premature spillage to pyriform  sinuses;Reduced pharyngeal peristalsis;Reduced epiglottic  inversion;Reduced anterior laryngeal mobility;Reduced laryngeal  elevation;Reduced tongue base retraction;Pharyngeal residue -   valleculae;Pharyngeal residue - pyriform sinuses (Double swallows  ) Pharyngeal - Nectar Pharyngeal - Nectar Teaspoon: Delayed swallow  initiation;Premature spillage to valleculae;Premature spillage to  pyriform sinuses;Reduced pharyngeal peristalsis;Reduced  epiglottic inversion;Reduced anterior laryngeal mobility;Reduced  laryngeal elevation;Reduced tongue base  retraction;Penetration/Aspiration during swallow;Pharyngeal  residue - valleculae;Pharyngeal residue - pyriform sinuses Penetration/Aspiration details (nectar teaspoon): Material enters  airway, CONTACTS cords and not ejected out Pharyngeal - Nectar Cup: Delayed swallow initiation;Premature  spillage to valleculae;Premature spillage to pyriform  sinuses;Reduced pharyngeal peristalsis;Reduced epiglottic  inversion;Reduced anterior laryngeal mobility;Reduced laryngeal  elevation;Reduced airway/laryngeal closure;Reduced tongue base  retraction;Penetration/Aspiration during swallow;Moderate  aspiration;Pharyngeal residue - valleculae;Pharyngeal residue -  pyriform sinuses (Unable to f/c's for chin tuck) Penetration/Aspiration details (nectar cup): Material enters  airway, passes BELOW cords and not ejected out despite cough  attempt by  patient Pharyngeal - Solids Pharyngeal - Mechanical Soft: Delayed swallow  initiation;Premature spillage to valleculae;Reduced pharyngeal  peristalsis;Reduced epiglottic inversion;Reduced anterior  laryngeal mobility;Reduced laryngeal elevation;Reduced tongue  base retraction;Pharyngeal residue - valleculae;Pharyngeal  residue - posterior pharnyx (Drink of liquid helped clear  residue) Pharyngeal - Pill:  (Unable to move pill orally.)  Cervical Esophageal Phase    GO    Cervical Esophageal Phase Cervical Esophageal Phase: Vicente Masson T 12/16/2012, 12:57 PM     Scheduled Meds: . antiseptic oral rinse  15 mL Mouth Rinse BID AC  . chlorhexidine  15 mL Mouth/Throat BID  . docusate sodium  100 mg Oral BID  .  ferrous sulfate  325 mg Oral TID PC  . furosemide  10 mg Intravenous Once  . furosemide  10 mg Intravenous Once  . pantoprazole  40 mg Oral Q1200  . sertraline  50 mg Oral Daily  . traMADol  50 mg Oral Q6H   Continuous Infusions: . sodium chloride 100 mL/hr at 12/15/12 1024    Principal Problem:   Femur fracture Active Problems:   Anemia   Edema, lower extremity   Overweight (BMI 25.0-29.9)   Expected blood loss anemia   Acute renal failure   Hyperkalemia    Time spent:    Kela Millin  [facility] Pager [phone] If 7PM-7AM, please contact night-coverage at [email or web address], password [facility] 12/16/2012, 2:34 PM  LOS: 5 days

## 2012-12-16 NOTE — Progress Notes (Signed)
SPEECH PATHOLOGY  SLP was asked to evaluate the pt. Prior to transport to Radiology for [facility], as pt's son questioned if she would be able to participate.  Son is concerned that pt. Has had a stroke, stating she is more confused than her baseline, and is unable to see to read lips (states she has been able to read lips since she was 16).  Son also reports pt. Did not recognize him this morning and is not f/c's.  Spoke with RN who states pt. Appears to have equal strength on bilaterally, and has f/c's for her, but states pt. Has "a horrible swallow" with significant choking during meals.  When seen by this SLP, pt. Was alert and responsive to questions, although very HOH, and f/c's.  Pt. Appears appropriate to proceed with MBS.  Son agrees, but asks if pt. Can also have a CT scan.  Deferred to MD.  11:00-11:15 Maryjo Rochester T

## 2012-12-16 NOTE — Progress Notes (Signed)
[facility]) returned with patient's hearing aid and adjusted it back into patient's rt ear.

## 2012-12-16 NOTE — Procedures (Signed)
Objective Swallowing Evaluation: Modified Barium Swallowing Study  Patient Details  Name: [name] MRN: [id number] Date of Birth: 1915/07/21  Today's Date: 12/16/2012 Time: 1130-1200 SLP Time Calculation (min): 30 min  Past Medical History:  Past Medical History  Diagnosis Date  . Arthritis   . Coronary artery disease    Past Surgical History:  Past Surgical History  Procedure Laterality Date  . Hip fracture surgery      x 3  . Orif femur fracture Left 12/13/2012    Procedure: OPEN REDUCTION INTERNAL FIXATION (ORIF) DISTAL FEMUR FRACTURE;  Surgeon: [name], MD;  Location: [facility];  Service: Orthopedics;  Laterality: Left;   HPI:  79 year old female admitted after falling and fracturing her left femur.  Now s/p ORIF.  RN reports pt. with significant dysphagia, now with coughing/choking during meals. Son at bedside, questions if pt. may have had a stroke, stating she has been more confused, has not f/c's well, and is having trouble lip reading, in addition to her swallowing difficulty.  D/C to SNF pending.     Assessment / Plan / Recommendation Clinical Impression  Dysphagia Diagnosis: Moderate pharyngeal phase dysphagia;Suspected primary esophageal dysphagia Clinical impression: Pt. presents with a moderate pharyngeal and esophageal dysphagia (questionable primary esophageal dysphagia that is impacting the pharyngeal phase).  This is characterized by a delayed swallow with food/liquid reaching the level of the valleculae (and pyriforms with liquids) prior to swallow initiation.  There is decreased base of tongue contraction to the posterior pharyngeal wall, resulting in stasis with purees and mechanical consistencies on the post. pharyngeal wall, as well as residue in the valleculae and pyriforms.  Nectar thick liquids penetrated to the level of the cords when given by spoon (no cough response), and nectar was aspirated with cup sip (positive cough, but did not clear  aspirate).  Pt. was unable to f/c's consistently for use of chin tuck, but did follow "swallow again" which decreased the amount of laryngeal residue.  The esophageal sweep revealed what appeared to be significant stasis throughout the distal esophagus, with a corkscrew type appearance.  Most of the esophageal stasis cleared after a few minutes.  No radiologist was present to confirm esophageal observations.    Treatment Recommendation  F/U MBS in ___ days (Comment) (10)    Diet Recommendation Dysphagia 2 (Fine chop);Honey-thick liquid   Liquid Administration via: Cup Medication Administration: Crushed with puree Supervision: Full supervision/cueing for compensatory strategies Compensations: Slow rate;Small sips/bites;Multiple dry swallows after each bite/sip;Follow solids with liquid Postural Changes and/or Swallow Maneuvers: Seated upright 90 degrees;Upright 30-60 min after meal    Other  Recommendations Recommended Consults: Consider GI evaluation;Consider esophageal assessment Oral Care Recommendations: Oral care QID Other Recommendations: Order thickener from pharmacy;Have oral suction available;Clarify dietary restrictions   Follow Up Recommendations  Skilled Nursing facility    Frequency and Duration        Pertinent Vitals/Pain n/a    SLP Swallow Goals Patient will consume recommended diet without observed clinical signs of aspiration with: Moderate assistance Patient will utilize recommended strategies during swallow to increase swallowing safety with: Moderate assistance   General HPI: 79 year old female admitted after falling and fracturing her left femur.  Now s/p ORIF.  RN reports pt. with significant dysphagia, now with coughing/choking during meals. Son at bedside, questions if pt. may have had a stroke, stating she has been more confused, has not f/c's well, and is having trouble lip reading, in addition to her swallowing  difficulty.  D/C to SNF pending. Type of Study:  Modified Barium Swallowing Study Reason for Referral: Objectively evaluate swallowing function Previous Swallow Assessment: None in [facility] system Diet Prior to this Study: Dysphagia 3 (soft);Thin liquids Temperature Spikes Noted: Yes (low grade) Respiratory Status: Room air History of Recent Intubation: Yes Length of Intubations (days): 1 days (for surgery) Date extubated: 12/13/12 Behavior/Cognition: Alert;Cooperative;Confused;Distractible;Requires cueing;Decreased sustained attention;Hard of hearing Oral Cavity - Dentition: Adequate natural dentition Oral Motor / Sensory Function: Within functional limits Self-Feeding Abilities: Needs assist Patient Positioning: Upright in chair Baseline Vocal Quality: Clear Volitional Cough: Weak Volitional Swallow: Unable to elicit Anatomy: Within functional limits Pharyngeal Secretions: Not observed secondary MBS    Reason for Referral Objectively evaluate swallowing function   Oral Phase Oral Preparation/Oral Phase Oral Phase: WFL   Pharyngeal Phase Pharyngeal Phase Pharyngeal Phase: Impaired Pharyngeal - Pudding Pharyngeal - Pudding Teaspoon: Delayed swallow initiation;Premature spillage to valleculae;Reduced pharyngeal peristalsis;Reduced epiglottic inversion;Reduced anterior laryngeal mobility;Reduced laryngeal elevation;Reduced tongue base retraction;Pharyngeal residue - valleculae;Pharyngeal residue - pyriform sinuses;Pharyngeal residue - posterior pharnyx Pharyngeal - Honey Pharyngeal - Honey Cup: Delayed swallow initiation;Premature spillage to valleculae;Premature spillage to pyriform sinuses;Reduced pharyngeal peristalsis;Reduced epiglottic inversion;Reduced anterior laryngeal mobility;Reduced laryngeal elevation;Reduced tongue base retraction;Pharyngeal residue - valleculae;Pharyngeal residue - pyriform sinuses (Double swallows ) Pharyngeal - Nectar Pharyngeal - Nectar Teaspoon: Delayed swallow initiation;Premature spillage to  valleculae;Premature spillage to pyriform sinuses;Reduced pharyngeal peristalsis;Reduced epiglottic inversion;Reduced anterior laryngeal mobility;Reduced laryngeal elevation;Reduced tongue base retraction;Penetration/Aspiration during swallow;Pharyngeal residue - valleculae;Pharyngeal residue - pyriform sinuses Penetration/Aspiration details (nectar teaspoon): Material enters airway, CONTACTS cords and not ejected out Pharyngeal - Nectar Cup: Delayed swallow initiation;Premature spillage to valleculae;Premature spillage to pyriform sinuses;Reduced pharyngeal peristalsis;Reduced epiglottic inversion;Reduced anterior laryngeal mobility;Reduced laryngeal elevation;Reduced airway/laryngeal closure;Reduced tongue base retraction;Penetration/Aspiration during swallow;Moderate aspiration;Pharyngeal residue - valleculae;Pharyngeal residue - pyriform sinuses (Unable to f/c's for chin tuck) Penetration/Aspiration details (nectar cup): Material enters airway, passes BELOW cords and not ejected out despite cough attempt by patient Pharyngeal - Solids Pharyngeal - Mechanical Soft: Delayed swallow initiation;Premature spillage to valleculae;Reduced pharyngeal peristalsis;Reduced epiglottic inversion;Reduced anterior laryngeal mobility;Reduced laryngeal elevation;Reduced tongue base retraction;Pharyngeal residue - valleculae;Pharyngeal residue - posterior pharnyx (Drink of liquid helped clear residue) Pharyngeal - Pill:  (Unable to move pill orally.)  Cervical Esophageal Phase    GO    Cervical Esophageal Phase Cervical Esophageal Phase: Vicente Masson T 12/16/2012, 12:57 PM

## 2012-12-16 NOTE — Progress Notes (Signed)
   Subjective: 3 Days Post-Op Procedure(s) (LRB): OPEN REDUCTION INTERNAL FIXATION (ORIF) DISTAL FEMUR FRACTURE (Left)   Patient expresses no pain this morning. She appears to be confused, but appears to be normal once first awakened, nurses states she gets better after being up for a little while. No events throughout the night.  Objective:   VITALS:   Filed Vitals:   12/16/12 0640  BP: 114/67  Pulse: 77  Temp: 99 F (37.2 C)  Resp: 14    Neurovascular intact Dorsiflexion/Plantar flexion intact Incision: scant drainage No cellulitis present Compartment soft  LABS  Recent Labs  12/14/12 0415 12/15/12 0415 12/16/12 0415  HGB 8.3* 8.5* 7.1*  HCT 25.3* 26.4* 22.6*  WBC 9.7 12.1* 7.8  PLT 86* 121* 97*     Recent Labs  12/14/12 1420 12/15/12 0415 12/16/12 0415  NA 137 136 136  K 4.4 5.6* 4.4  BUN 22 35* 30*  CREATININE 0.81 1.51* 0.99  GLUCOSE 182* 142* 142*     Assessment/Plan: 3 Days Post-Op Procedure(s) (LRB): OPEN REDUCTION INTERNAL FIXATION (ORIF) DISTAL FEMUR FRACTURE (Left) Up with therapy Discharge to SNF eventually, when ready  ABLA  2 units of blood ordered and to be administered with Lasix between each unit  Overweight (BMI 25-29.9) Estimated body mass index is 28.36 kg/(m^2) as calculated from the following:   Height as of this encounter: 5\' 1"  (1.549 m).   Weight as of this encounter: 68.04 kg (150 lb). Patient also counseled that weight may inhibit the healing process Patient counseled that losing weight will help with future health issues       Anastasio Auerbach. [name]   PAC  12/16/2012, 9:14 AM

## 2012-12-17 LAB — TYPE AND SCREEN
ABO/RH(D): A NEG
Unit division: 0

## 2012-12-17 LAB — BASIC METABOLIC PANEL
BUN: 23 mg/dL (ref 6–23)
Calcium: 7.9 mg/dL — ABNORMAL LOW (ref 8.4–10.5)
Chloride: 106 mEq/L (ref 96–112)
Creatinine, Ser: 0.71 mg/dL (ref 0.50–1.10)
GFR calc non Af Amer: 72 mL/min — ABNORMAL LOW (ref >90)
Glucose, Bld: 121 mg/dL — ABNORMAL HIGH (ref 70–99)

## 2012-12-17 LAB — CBC
HCT: 27.2 % — ABNORMAL LOW (ref 36.0–46.0)
Hemoglobin: 9.2 g/dL — ABNORMAL LOW (ref 12.0–15.0)
MCH: 31.9 pg (ref 26.0–34.0)
MCHC: 33.8 g/dL (ref 30.0–36.0)
MCV: 94.4 fL (ref 78.0–100.0)
RBC: 2.88 MIL/uL — ABNORMAL LOW (ref 3.87–5.11)

## 2012-12-17 MED ORDER — POTASSIUM CHLORIDE CRYS ER 20 MEQ PO TBCR
20.0000 meq | EXTENDED_RELEASE_TABLET | Freq: Once | ORAL | Status: DC
  Filled 2012-12-17: qty 1

## 2012-12-17 MED ORDER — POTASSIUM CHLORIDE CRYS ER 20 MEQ PO TBCR
40.0000 meq | EXTENDED_RELEASE_TABLET | Freq: Once | ORAL | Status: DC
  Administered 2012-12-17: 40 meq via ORAL
  Filled 2012-12-17: qty 2

## 2012-12-17 MED ORDER — POTASSIUM CHLORIDE CRYS ER 20 MEQ PO TBCR
60.0000 meq | EXTENDED_RELEASE_TABLET | Freq: Two times a day (BID) | ORAL | Status: DC
  Administered 2012-12-17: 60 meq via ORAL
  Filled 2012-12-17 (×2): qty 3

## 2012-12-17 MED ORDER — FERROUS SULFATE 325 (65 FE) MG PO TABS: 325.0000 mg | ORAL_TABLET | Freq: Three times a day (TID) | ORAL | Status: DC

## 2012-12-17 MED ORDER — ASPIRIN EC 325 MG PO TBEC: 325.0000 mg | DELAYED_RELEASE_TABLET | Freq: Every day | ORAL | Status: AC

## 2012-12-17 MED ORDER — POLYETHYLENE GLYCOL 3350 17 G PO PACK: 17.0000 g | PACK | Freq: Every day | ORAL | Status: DC | PRN

## 2012-12-17 MED ORDER — OXYCODONE HCL 5 MG PO TABS: 5.0000 mg | ORAL_TABLET | ORAL | Status: DC | PRN

## 2012-12-17 MED ORDER — DSS 100 MG PO CAPS: 100.0000 mg | ORAL_CAPSULE | Freq: Two times a day (BID) | ORAL | Status: DC

## 2012-12-17 NOTE — Progress Notes (Signed)
Speech Language Pathology Dysphagia Treatment Patient Details Name: [name] MRN: [id number] DOB: May 26, 1915 Today's Date: 12/17/2012 Time: 1125-1140 SLP Time Calculation (min): 15 min  Assessment / Plan / Recommendation Clinical Impression  Pt reclining in bed, placing order for lunch.  RN reports poor intake, due to not being interested in eating. Pt was observed with puree and honey thick liquids.  Large boluses were taken quickly if cues to adhere to safe swallow precautions were not encouraged.  Pt does not recall precautions independently (multiple swallows, alternating solids and liquids, remaining upright following meals), and so full supervision continues to be recommended during po intake.  Pt DC is planned for today. SLP placed safe swallow precautions sheet in pt's bag on sofa, to ensure it goes with her at DC.  Nsg was informed of this as well.  No family present at this time.    Diet Recommendation  Continue with Current Diet: Dysphagia 2 (fine chop);Honey-thick liquid    SLP Plan Discharge SLP treatment due to (comment);All goals met   Pertinent Vitals/Pain No pain reported   Swallowing Goals  SLP Swallowing Goals Patient will consume recommended diet without observed clinical signs of aspiration with: Moderate assistance Swallow Study Goal #1 - Progress: Met Patient will utilize recommended strategies during swallow to increase swallowing safety with: Moderate assistance Swallow Study Goal #2 - Progress: Met  General Temperature Spikes Noted: No Respiratory Status: Room air Behavior/Cognition: Alert;Cooperative;Confused;Distractible;Requires cueing;Decreased sustained attention;Hard of hearing;Other (comment) (irritated) Oral Cavity - Dentition: Adequate natural dentition Patient Positioning: Upright in bed  Oral Cavity - Oral Hygiene Does patient have any of the following "at risk" factors?: Nutritional status - inadequate Brush patient's teeth BID with  toothbrush (using toothpaste with fluoride): Yes Patient is AT RISK - Oral Care Protocol followed (see row info): Yes   Dysphagia Treatment Treatment focused on: Skilled observation of diet tolerance;Patient/family/caregiver education Treatment Methods/Modalities: Skilled observation Patient observed directly with PO's: Yes Type of PO's observed: Dysphagia 1 (puree);Honey-thick liquids Feeding: Needs assist Liquids provided via: Cup Type of cueing: Verbal;Visual Amount of cueing: Moderate   GO   Celia B. [place], [facility], [name] [phone]   Leigh Aurora 12/17/2012, 12:23 PM

## 2012-12-17 NOTE — Progress Notes (Signed)
   Subjective: 4 Days Post-Op Procedure(s) (LRB): OPEN REDUCTION INTERNAL FIXATION (ORIF) DISTAL FEMUR FRACTURE (Left)   Patient reports pain as mild, pain well controlled. No events throughout the night. Doing better after receiving blood yesterday. Ready to be discharged to SNF.  Objective:   VITALS:   Filed Vitals:   12/17/12 0558  BP: 128/70  Pulse: 77  Temp: 97.4 F (36.3 C)  Resp: 16    Neurovascular intact Dorsiflexion/Plantar flexion intact Incision: dressing C/D/I No cellulitis present Compartment soft  LABS  Recent Labs  12/15/12 0415 12/16/12 0415 12/17/12 0423  HGB 8.5* 7.1* 9.2*  HCT 26.4* 22.6* 27.2*  WBC 12.1* 7.8 6.1  PLT 121* 97* 91*     Recent Labs  12/15/12 0415 12/16/12 0415 12/17/12 0423  NA 136 136 137  K 5.6* 4.4 3.1*  BUN 35* 30* 23  CREATININE 1.51* 0.99 0.71  GLUCOSE 142* 142* 121*     Assessment/Plan: 4 Days Post-Op Procedure(s) (LRB): OPEN REDUCTION INTERNAL FIXATION (ORIF) DISTAL FEMUR FRACTURE (Left) Up with therapy Discharge to SNF Follow up in 2 weeks at [facility]. Follow up with OLIN,Daly Whipkey D in 2 weeks.  Contact information:  [facility] [address] [place] [address] [phone]    Expected ABLA  Received 2 units of blood yesterday.  Continue iron and monitor.   Overweight (BMI 25-29.9)  Estimated body mass index is 28.36 kg/(m^2) as calculated from the following:      Height as of this encounter: 5\' 1"  (1.549 m).      Weight as of this encounter: 68.04 kg (150 lb).  Patient also counseled that weight may inhibit the healing process  Patient counseled that losing weight will help with future health issues       [name]. [name]   PAC  12/17/2012, 8:19 AM

## 2012-12-17 NOTE — Progress Notes (Signed)
Clinical Social Work Department CLINICAL SOCIAL WORK PLACEMENT NOTE 12/17/2012  Patient:  [name], [name]  Account Number:  [id number] Admit date:  12/11/2012  Clinical Social Worker:  Cori Razor, LCSW  Date/time:  12/14/2012 01:39 PM  Clinical Social Work is seeking post-discharge placement for this patient at the following level of care:   SKILLED NURSING   (*CSW will update this form in Epic as items are completed)     Patient/family provided with [facility] Department of Clinical Social Work's list of facilities offering this level of care within the geographic area requested by the patient (or if unable, by the patient's family).  12/14/2012  Patient/family informed of their freedom to choose among providers that offer the needed level of care, that participate in Medicare, Medicaid or managed care program needed by the patient, have an available bed and are willing to accept the patient.    Patient/family informed of [facility]' ownership interest in [facility], as well as of the fact that they are under no obligation to receive care at this facility.  PASARR submitted to EDS on  PASARR number received from [employer] on 05/07/2010  FL2 transmitted to all facilities in geographic area requested by pt/family on  12/14/2012 FL2 transmitted to all facilities within larger geographic area on   Patient informed that his/her managed care company has contracts with or will negotiate with  certain facilities, including the following:     Patient/family informed of bed offers received:  12/14/2012 Patient chooses bed at [facility] Physician recommends and patient chooses bed at    Patient to be transferred to [facility] on  12/17/2012 Patient to be transferred to facility by [facility]  The following physician request were entered in Epic:   Additional Comments:  Cori Razor LCSW [phone]

## 2012-12-17 NOTE — Discharge Summary (Signed)
Physician Discharge Summary  Patient ID: [name] MRN: [id number] DOB/AGE: 1915/11/03 79 y.o.  Admit date: 12/11/2012 Discharge date:  12/17/2012  Procedures:  Procedure(s) (LRB): OPEN REDUCTION INTERNAL FIXATION (ORIF) DISTAL FEMUR FRACTURE (Left)  Attending Physician:  Dr. Durene Romans   Admission Diagnoses:   Left distal femur fracture  Discharge Diagnoses:  Principal Problem:   Femur fracture Active Problems:   Anemia   Edema, lower extremity   Overweight (BMI 25.0-29.9)   Expected blood loss anemia   Acute renal failure   Hyperkalemia  Past Medical History  Diagnosis Date  . Arthritis   . Coronary artery disease     HPI:    79 year old female presented to emergency department via EMS from home. Patient was walking back from the bathroom, she slipped and fell. No head injury. No LOC. No other injuries. EMS reports upon their arrival, the left leg was significantly deformed. They placed left leg in splint, and brought it back to an anatomic position. They report the deformity was around the knee joint. Patient reports pain in her left hip. Has had prior left hip surgery. Orthopaedics was called to admit the patient.   PCP: Juline Patch, MD   Discharged Condition: fair  Hospital Course:  Patient was admitted to the hospital on 12/11/2012 where she had an uneventful course until she has surgery.  Patient underwent the above stated procedure on 12/13/2012. Patient tolerated the procedure well and brought to the recovery room in good condition and subsequently to the floor.  POD #1 BP: 114/67 ; Pulse: 87 ; Temp: 98.8 F (37.1 C) ; Resp: 16  Patient reports pain as mild, is was laying comfortably in her bed this morning. She is moving around in the bed from the waist up, and not complaining of any pain. BP ran a little low last night and fluids were increased slightly. Appears that BP is more normal this morning. No other events throughout the night. Neurovascular  intact, dorsiflexion/plantar flexion intact, incision: dressing C/D/I, no cellulitis present and compartment soft.   LABS  Basename    HGB  8.3  HCT  25.3   POD #2  BP: 101/62 ; Pulse: 84 ; Temp: 97.9 F (36.6 C) ; Resp: 16  Patient reports pain as mild, pain controlled. Resting comfortably in bed this morning. Events during late yesterday and last night including choking on foods. Neurovascular intact, dorsiflexion/plantar flexion intact, incision: dressing C/D/I, no cellulitis present and compartment soft.   LABS  Basename    HGB  8.5  HCT  26.4   POD #3  BP: 114/67 ; Pulse: 77 ; Temp: 99 F (37.2 C) ; Resp: 14 Patient expresses no pain this morning. She appears to be confused, but appears to be normal once first awakened, nurses states she gets better after being up for a little while. No events throughout the night.  Received 2 units of blood do to symptomatic anemia Neurovascular intact, dorsiflexion/plantar flexion intact, incision: dressing C/D/I, no cellulitis present and compartment soft.   LABS  Basename    HGB  7.1  HCT  22.6   POD #4 BP: 128/70 ; Pulse: 77 ; Temp: 97.4 F (36.3 C) ; Resp: 16 Patient reports pain as mild, pain well controlled. No events throughout the night. Doing better after receiving blood yesterday. Dressing changed. Ready to be discharged to SNF. Neurovascular intact, dorsiflexion/plantar flexion intact, incision: dressing C/D/I, no cellulitis present and compartment soft.   LABS  Basename  HGB  8.5  HCT  26.4    Discharge Exam: General appearance: alert, cooperative and no distress Extremities: Homans sign is negative, no sign of DVT, no edema, redness or tenderness in the calves or thighs and no ulcers, gangrene or trophic changes  Disposition:    SNF  with follow up in 2 weeks   Follow-up Information   Follow up with [name], MD. Schedule an appointment as soon as possible for a visit in 2 weeks.   Specialty:  Orthopedic  Surgery   Contact information:   [address] [place] [address] [phone]       Discharge Orders   Future Appointments Provider Department Dept Phone   12/18/2012 10:30 AM [facility] [phone]   Future Orders Complete By Expires   Call MD / Call 911  As directed    Comments:     If you experience chest pain or shortness of breath, CALL 911 and be transported to the hospital emergency room.  If you develope a fever above 101 F, pus (white drainage) or increased drainage or redness at the wound, or calf pain, call your surgeon's office.   Constipation Prevention  As directed    Comments:     Drink plenty of fluids.  Prune juice may be helpful.  You may use a stool softener, such as Colace (over the counter) 100 mg twice a day.  Use MiraLax (over the counter) for constipation as needed.   Diet - low sodium heart healthy  As directed    Discharge instructions  As directed    Comments:     Daily dressing changes with gauze and tape. Keep the area dry and clean until follow up. Follow up in 2 weeks at [facility]. Call with any questions or concerns.   Non weight bearing  As directed    Comments:     Left leg.        Medication List    STOP taking these medications       meloxicam 15 MG tablet  Commonly known as:  MOBIC      TAKE these medications       aspirin EC 325 MG tablet  Take 1 tablet (325 mg total) by mouth daily. Take for 4 weeks.     cholecalciferol 1000 UNITS tablet  Commonly known as:  VITAMIN D  Take 1,000 Units by mouth every morning.     DSS 100 MG Caps  Take 100 mg by mouth 2 (two) times daily.     ferrous sulfate 325 (65 FE) MG tablet  Take 1 tablet (325 mg total) by mouth 3 (three) times daily after meals.     fish oil-omega-3 fatty acids 1000 MG capsule  Take 1 g by mouth every morning.     furosemide 40 MG tablet  Commonly known as:  LASIX  Take 40 mg  by mouth every morning.     ICAPS AREDS FORMULA PO  Take 1 capsule by mouth every morning.     magnesium oxide 400 MG tablet  Commonly known as:  MAG-OX  Take 400 mg by mouth every evening.     multivitamin with minerals Tabs tablet  Take 1 tablet by mouth every morning.     omeprazole 40 MG capsule  Commonly known as:  PRILOSEC  Take 40 mg by mouth every morning.     oxyCODONE 5 MG immediate release tablet  Commonly  known as:  Oxy IR/ROXICODONE  Take 1-2 tablets (5-10 mg total) by mouth every 4 (four) hours as needed for pain.     oyster calcium 500 MG Tabs tablet  Take 500 mg of elemental calcium by mouth every morning.     polyethylene glycol packet  Commonly known as:  MIRALAX / GLYCOLAX  Take 17 g by mouth daily as needed.     potassium chloride 10 MEQ tablet  Commonly known as:  K-DUR  Take 10 mEq by mouth every morning.     sertraline 50 MG tablet  Commonly known as:  ZOLOFT  Take 50 mg by mouth every morning.     triazolam 0.25 MG tablet  Commonly known as:  HALCION  Take 0.25 mg by mouth at bedtime.           Signed: Anastasio Auerbach. Elazar Argabright   PAC  12/17/2012, 8:38 AM

## 2012-12-18 ENCOUNTER — Encounter (HOSPITAL_BASED_OUTPATIENT_CLINIC_OR_DEPARTMENT_OTHER): Payer: Medicare Other | Attending: General Surgery

## 2012-12-31 ENCOUNTER — Other Ambulatory Visit: Payer: Self-pay | Admitting: *Deleted

## 2013-01-01 ENCOUNTER — Non-Acute Institutional Stay (SKILLED_NURSING_FACILITY): Payer: Medicare Other | Admitting: Adult Health

## 2013-01-01 DIAGNOSIS — S7290XA Unspecified fracture of unspecified femur, initial encounter for closed fracture: Secondary | ICD-10-CM

## 2013-01-01 DIAGNOSIS — S7292XA Unspecified fracture of left femur, initial encounter for closed fracture: Secondary | ICD-10-CM

## 2013-01-01 DIAGNOSIS — K219 Gastro-esophageal reflux disease without esophagitis: Secondary | ICD-10-CM

## 2013-01-01 DIAGNOSIS — F329 Major depressive disorder, single episode, unspecified: Secondary | ICD-10-CM

## 2013-01-01 DIAGNOSIS — R600 Localized edema: Secondary | ICD-10-CM

## 2013-01-01 DIAGNOSIS — R609 Edema, unspecified: Secondary | ICD-10-CM

## 2013-01-01 DIAGNOSIS — N390 Urinary tract infection, site not specified: Secondary | ICD-10-CM

## 2013-01-01 DIAGNOSIS — D649 Anemia, unspecified: Secondary | ICD-10-CM

## 2013-01-01 DIAGNOSIS — K5900 Constipation, unspecified: Secondary | ICD-10-CM

## 2013-01-12 DIAGNOSIS — N390 Urinary tract infection, site not specified: Secondary | ICD-10-CM | POA: Insufficient documentation

## 2013-01-12 DIAGNOSIS — K5900 Constipation, unspecified: Secondary | ICD-10-CM | POA: Insufficient documentation

## 2013-01-12 DIAGNOSIS — F329 Major depressive disorder, single episode, unspecified: Secondary | ICD-10-CM | POA: Insufficient documentation

## 2013-01-12 NOTE — Progress Notes (Signed)
Patient ID: [name], female   DOB: 19-Mar-1915, 79 y.o.   MRN: [id number]       PROGRESS NOTE  DATE: 01/01/2013   FACILITY: [facility]  LEVEL OF CARE: SNF (31)  Acute Visit  CHIEF COMPLAINT:  Follow-up hospitalization  HISTORY OF PRESENT ILLNESS: This is a 79 year old female who has been admitted to [facility] on 12/17/12 from [facility]. She had a fall at home sustaining a left femur fracture S/P ORIF. She has been admitted for a short-term rehabilitation.   Urine culture recently obtained shows >= 100,000 colonies/ml E Coli/ESBL. No fever nor hematuria noted.  Reassessment of ongoing problem(s):  GERD: pt's GERD is stable.  Denies ongoing heartburn, abd. Pain, nausea or vomiting.  Currently on a PPI & tolerates it without any adverse reactions.  ANEMIA: The anemia has been stable. The patient denies fatigue, melena or hematochezia. No complications from the medications currently being used.ANEMIA: The anemia has been stable. The patient denies fatigue, melena or hematochezia. No complications from the medications currently being used. 9/14 hgb 7.9  DEPRESSION: The depression remains stable. Patient denies ongoing feelings of sadness, insomnia, anedhonia or lack of appetite. No complications reported from the medications currently being used. Staff do not report behavioral problems.  PAST MEDICAL HISTORY : Reviewed.  No changes.  CURRENT MEDICATIONS: Reviewed per [facility]  REVIEW OF SYSTEMS:  GENERAL: no change in appetite, no fatigue, no weight changes, no fever, chills or weakness RESPIRATORY: no cough, SOB, DOE, wheezing, hemoptysis CARDIAC: no chest pain, or palpitations, +edema GI: no abdominal pain, diarrhea, constipation, heart burn, nausea or vomiting  PHYSICAL EXAMINATION  VS:  T97.6       P68       RR17      BP120/84             WT157.8 (Lb)  GENERAL: no acute distress, normal body habitus EYES: conjunctivae normal, sclerae normal,  normal eye lids NECK: supple, trachea midline, no neck masses, no thyroid tenderness, no thyromegaly LYMPHATICS: no LAN in the neck, no supraclavicular LAN RESPIRATORY: breathing is even & unlabored, BS CTAB CARDIAC: RRR, no murmur,no extra heart sounds, BLE edema, 2+ GI: abdomen soft, normal BS, no masses, no tenderness, no hepatomegaly, no splenomegaly PSYCHIATRIC: the patient is alert & oriented to person, affect & behavior appropriate  LABS/RADIOLOGY: 12/13/12  Wbc 7.1  hgb 7.9  NA 140  K 4.6  BUN 26  Creatinine 0.87  Glucose 133  ASSESSMENT/PLAN:  Left femur fracture S/P ORIF - for rehabilitation  UTI - start Bactrim DS 1 tab PO BID x 10 days  Constipation - no complaints  Edema - continue Lasix  Esophageal Reflux - stable  Depression - continue Zoloft  Anemia - continue FeSO4   CPT CODE: 86578

## 2013-01-25 ENCOUNTER — Other Ambulatory Visit: Payer: Self-pay | Admitting: *Deleted

## 2013-01-25 MED ORDER — ZOLPIDEM TARTRATE 5 MG PO TABS: 5.0000 mg | ORAL_TABLET | Freq: Every evening | ORAL | Status: DC | PRN

## 2013-01-25 MED ORDER — OXYCODONE HCL 5 MG PO TABS: ORAL_TABLET | ORAL | Status: DC

## 2013-03-22 ENCOUNTER — Other Ambulatory Visit: Payer: Self-pay | Admitting: *Deleted

## 2013-03-22 MED ORDER — OXYCODONE HCL 5 MG PO TABS: ORAL_TABLET | ORAL | Status: DC

## 2013-03-24 ENCOUNTER — Non-Acute Institutional Stay (SKILLED_NURSING_FACILITY): Payer: Medicare Other | Admitting: Internal Medicine

## 2013-03-24 DIAGNOSIS — I2510 Atherosclerotic heart disease of native coronary artery without angina pectoris: Secondary | ICD-10-CM

## 2013-03-24 DIAGNOSIS — K5900 Constipation, unspecified: Secondary | ICD-10-CM

## 2013-03-24 DIAGNOSIS — S8290XS Unspecified fracture of unspecified lower leg, sequela: Secondary | ICD-10-CM

## 2013-03-24 DIAGNOSIS — S7291XS Unspecified fracture of right femur, sequela: Secondary | ICD-10-CM

## 2013-03-24 DIAGNOSIS — D500 Iron deficiency anemia secondary to blood loss (chronic): Secondary | ICD-10-CM

## 2013-03-24 NOTE — Progress Notes (Signed)
        PROGRESS NOTE  DATE: 03/24/2013   FACILITY: [facility]  LEVEL OF CARE: SNF (31)  Discharge Visit  CHIEF COMPLAINT:  Manage left femur fracture and CAD  HISTORY OF PRESENT ILLNESS: I was requested by the social worker to perform face-to-face evaluation for discharge:  Patient was admitted to this facility for short-term rehabilitation after the patient's recent hospitalization.  Patient has completed SNF rehabilitation and therapy has cleared the patient for discharge on 03-26-12.  Reassessment of ongoing problem(s):  HIP FRACTURE: The patient had a mechanical fall and sustained a femur fracture.  Patient subsequently underwent surgical repair and tolerated the procedure well. Patient is admitted to this facility for short-term rehabilitation. Patient denies hip pain currently. No complications reported from the pain medications currently being used.  CAD: The angina has been stable. The patient denies dyspnea on exertion, orthopnea, palpitations and paroxysmal nocturnal dyspnea. No complications noted from the medication presently being used. Complains of chronic lower extremity swelling  PAST MEDICAL HISTORY : Reviewed.  No changes.  CURRENT MEDICATIONS: Reviewed per [facility]  REVIEW OF SYSTEMS:  GENERAL: no change in appetite, no fatigue, no weight changes, no fever, chills or weakness RESPIRATORY: no cough, SOB, DOE, wheezing, hemoptysis CARDIAC: no chest pain,  or palpitations, complains of swelling GI: no abdominal pain, diarrhea, constipation, heart burn, nausea or vomiting  PHYSICAL EXAMINATION  GENERAL: no acute distress, normal body habitus EYES: conjunctivae normal, sclerae normal, normal eye lids NECK: supple, trachea midline, no neck masses, no thyroid tenderness, no thyromegaly LYMPHATICS: no LAN in the neck, no supraclavicular LAN RESPIRATORY: breathing is even & unlabored, BS CTAB CARDIAC: RRR, no murmur,no extra heart sounds, +2  bilateral lower extremity is edema GI: abdomen soft, normal BS, no masses, no tenderness, no hepatomegaly, no splenomegaly PSYCHIATRIC: the patient is alert & oriented to person, affect & behavior appropriate  LABS/RADIOLOGY:  10-14 CO2 35 otherwise BMP normal, hemoglobin 9.8, MCV 102.2 otherwise CBC normal  ASSESSMENT/PLAN:  Left femur fracture- status post ORIF CAD-stable Acute blood loss anemia-stable. Continue Fe. Constipation-with controlled GERD-stable Depression-continue Zoloft  I have filled out patient's discharge paperwork and written prescriptions.  Patient will receive home health PT, OT, nursing. DME provided: Lightweight wheelchair with elevated leg rests and cushion  Total discharge time: Greater than 30 minutes Discharge time involved coordination of the discharge process with Education officer, museum, nursing staff and therapy department. Medical justification for home health services/DME verified.  CPT CODE: 79150

## 2013-05-26 ENCOUNTER — Encounter (HOSPITAL_COMMUNITY): Payer: Self-pay | Admitting: Emergency Medicine

## 2013-05-26 ENCOUNTER — Observation Stay (HOSPITAL_COMMUNITY)
Admission: EM | Admit: 2013-05-26 | Discharge: 2013-05-29 | Disposition: A | Discharge status: Home / Self Care | Payer: Medicare Other | Attending: Internal Medicine | Admitting: Internal Medicine

## 2013-05-26 ENCOUNTER — Emergency Department (HOSPITAL_COMMUNITY): Payer: Medicare Other

## 2013-05-26 DIAGNOSIS — K859 Acute pancreatitis without necrosis or infection, unspecified: Secondary | ICD-10-CM

## 2013-05-26 DIAGNOSIS — S7290XA Unspecified fracture of unspecified femur, initial encounter for closed fracture: Secondary | ICD-10-CM

## 2013-05-26 DIAGNOSIS — N390 Urinary tract infection, site not specified: Secondary | ICD-10-CM

## 2013-05-26 DIAGNOSIS — R112 Nausea with vomiting, unspecified: Secondary | ICD-10-CM | POA: Insufficient documentation

## 2013-05-26 DIAGNOSIS — L8990 Pressure ulcer of unspecified site, unspecified stage: Secondary | ICD-10-CM | POA: Insufficient documentation

## 2013-05-26 DIAGNOSIS — I2510 Atherosclerotic heart disease of native coronary artery without angina pectoris: Secondary | ICD-10-CM

## 2013-05-26 DIAGNOSIS — I219 Acute myocardial infarction, unspecified: Secondary | ICD-10-CM

## 2013-05-26 DIAGNOSIS — R109 Unspecified abdominal pain: Principal | ICD-10-CM | POA: Diagnosis present

## 2013-05-26 DIAGNOSIS — K589 Irritable bowel syndrome without diarrhea: Secondary | ICD-10-CM

## 2013-05-26 DIAGNOSIS — Z86010 Personal history of colonic polyps: Secondary | ICD-10-CM

## 2013-05-26 DIAGNOSIS — L89109 Pressure ulcer of unspecified part of back, unspecified stage: Secondary | ICD-10-CM | POA: Insufficient documentation

## 2013-05-26 DIAGNOSIS — K5730 Diverticulosis of large intestine without perforation or abscess without bleeding: Secondary | ICD-10-CM

## 2013-05-26 DIAGNOSIS — L89609 Pressure ulcer of unspecified heel, unspecified stage: Secondary | ICD-10-CM | POA: Insufficient documentation

## 2013-05-26 DIAGNOSIS — Z880 Allergy status to penicillin: Secondary | ICD-10-CM | POA: Insufficient documentation

## 2013-05-26 DIAGNOSIS — L97429 Non-pressure chronic ulcer of left heel and midfoot with unspecified severity: Secondary | ICD-10-CM | POA: Diagnosis present

## 2013-05-26 DIAGNOSIS — R262 Difficulty in walking, not elsewhere classified: Secondary | ICD-10-CM | POA: Insufficient documentation

## 2013-05-26 DIAGNOSIS — E43 Unspecified severe protein-calorie malnutrition: Secondary | ICD-10-CM | POA: Diagnosis present

## 2013-05-26 DIAGNOSIS — R933 Abnormal findings on diagnostic imaging of other parts of digestive tract: Secondary | ICD-10-CM

## 2013-05-26 DIAGNOSIS — L8992 Pressure ulcer of unspecified site, stage 2: Secondary | ICD-10-CM | POA: Diagnosis present

## 2013-05-26 DIAGNOSIS — R5383 Other fatigue: Secondary | ICD-10-CM

## 2013-05-26 DIAGNOSIS — M4850XA Collapsed vertebra, not elsewhere classified, site unspecified, initial encounter for fracture: Secondary | ICD-10-CM

## 2013-05-26 DIAGNOSIS — K838 Other specified diseases of biliary tract: Secondary | ICD-10-CM | POA: Diagnosis present

## 2013-05-26 DIAGNOSIS — K219 Gastro-esophageal reflux disease without esophagitis: Secondary | ICD-10-CM | POA: Diagnosis present

## 2013-05-26 DIAGNOSIS — K831 Obstruction of bile duct: Secondary | ICD-10-CM | POA: Diagnosis present

## 2013-05-26 DIAGNOSIS — D126 Benign neoplasm of colon, unspecified: Secondary | ICD-10-CM

## 2013-05-26 DIAGNOSIS — K5900 Constipation, unspecified: Secondary | ICD-10-CM | POA: Diagnosis present

## 2013-05-26 DIAGNOSIS — F329 Major depressive disorder, single episode, unspecified: Secondary | ICD-10-CM

## 2013-05-26 DIAGNOSIS — R5381 Other malaise: Secondary | ICD-10-CM | POA: Diagnosis present

## 2013-05-26 DIAGNOSIS — R932 Abnormal findings on diagnostic imaging of liver and biliary tract: Secondary | ICD-10-CM

## 2013-05-26 DIAGNOSIS — K648 Other hemorrhoids: Secondary | ICD-10-CM | POA: Insufficient documentation

## 2013-05-26 DIAGNOSIS — R600 Localized edema: Secondary | ICD-10-CM

## 2013-05-26 DIAGNOSIS — N179 Acute kidney failure, unspecified: Secondary | ICD-10-CM

## 2013-05-26 DIAGNOSIS — E860 Dehydration: Secondary | ICD-10-CM | POA: Diagnosis present

## 2013-05-26 DIAGNOSIS — E663 Overweight: Secondary | ICD-10-CM

## 2013-05-26 DIAGNOSIS — F32A Depression, unspecified: Secondary | ICD-10-CM

## 2013-05-26 DIAGNOSIS — K921 Melena: Secondary | ICD-10-CM | POA: Diagnosis present

## 2013-05-26 DIAGNOSIS — E875 Hyperkalemia: Secondary | ICD-10-CM

## 2013-05-26 DIAGNOSIS — D500 Iron deficiency anemia secondary to blood loss (chronic): Secondary | ICD-10-CM

## 2013-05-26 DIAGNOSIS — D649 Anemia, unspecified: Secondary | ICD-10-CM

## 2013-05-26 HISTORY — DX: Gastro-esophageal reflux disease without esophagitis: K21.9

## 2013-05-26 LAB — I-STAT CHEM 8, ED
BUN: 15 mg/dL (ref 6–23)
CALCIUM ION: 1.21 mmol/L (ref 1.13–1.30)
CHLORIDE: 100 meq/L (ref 96–112)
Creatinine, Ser: 1 mg/dL (ref 0.50–1.10)
Glucose, Bld: 124 mg/dL — ABNORMAL HIGH (ref 70–99)
HEMATOCRIT: 37 % (ref 36.0–46.0)
Hemoglobin: 12.6 g/dL (ref 12.0–15.0)
Potassium: 3.6 mEq/L — ABNORMAL LOW (ref 3.7–5.3)
Sodium: 140 mEq/L (ref 137–147)
TCO2: 29 mmol/L (ref 0–100)

## 2013-05-26 LAB — URINE MICROSCOPIC-ADD ON

## 2013-05-26 LAB — CBC WITH DIFFERENTIAL/PLATELET
BASOS ABS: 0 10*3/uL (ref 0.0–0.1)
Basophils Relative: 0 % (ref 0–1)
Eosinophils Absolute: 0.2 10*3/uL (ref 0.0–0.7)
Eosinophils Relative: 3 % (ref 0–5)
HEMATOCRIT: 36.6 % (ref 36.0–46.0)
HEMOGLOBIN: 12 g/dL (ref 12.0–15.0)
LYMPHS PCT: 28 % (ref 12–46)
Lymphs Abs: 2 10*3/uL (ref 0.7–4.0)
MCH: 32.5 pg (ref 26.0–34.0)
MCHC: 32.8 g/dL (ref 30.0–36.0)
MCV: 99.2 fL (ref 78.0–100.0)
MONO ABS: 0.6 10*3/uL (ref 0.1–1.0)
Monocytes Relative: 9 % (ref 3–12)
Neutro Abs: 4.4 10*3/uL (ref 1.7–7.7)
Neutrophils Relative %: 60 % (ref 43–77)
Platelets: 245 10*3/uL (ref 150–400)
RBC: 3.69 MIL/uL — ABNORMAL LOW (ref 3.87–5.11)
RDW: 14.9 % (ref 11.5–15.5)
WBC: 7.3 10*3/uL (ref 4.0–10.5)

## 2013-05-26 LAB — HEPATIC FUNCTION PANEL
ALBUMIN: 2.5 g/dL — AB (ref 3.5–5.2)
ALT: 13 U/L (ref 0–35)
AST: 27 U/L (ref 0–37)
Alkaline Phosphatase: 144 U/L — ABNORMAL HIGH (ref 39–117)
Bilirubin, Direct: <0.2 mg/dL (ref 0.0–0.3)
TOTAL PROTEIN: 6.5 g/dL (ref 6.0–8.3)
Total Bilirubin: 0.5 mg/dL (ref 0.3–1.2)

## 2013-05-26 LAB — URINALYSIS, ROUTINE W REFLEX MICROSCOPIC
BILIRUBIN URINE: NEGATIVE
Glucose, UA: NEGATIVE mg/dL
KETONES UR: NEGATIVE mg/dL
NITRITE: NEGATIVE
PH: 5 (ref 5.0–8.0)
Protein, ur: NEGATIVE mg/dL
SPECIFIC GRAVITY, URINE: 1.02 (ref 1.005–1.030)
Urobilinogen, UA: 0.2 mg/dL (ref 0.0–1.0)

## 2013-05-26 LAB — POC OCCULT BLOOD, ED: Fecal Occult Bld: POSITIVE — AB

## 2013-05-26 LAB — I-STAT TROPONIN, ED: Troponin i, poc: 0.01 ng/mL (ref 0.00–0.08)

## 2013-05-26 LAB — LIPASE, BLOOD: Lipase: 24 U/L (ref 11–59)

## 2013-05-26 MED ORDER — HYDROCODONE-ACETAMINOPHEN 5-325 MG PO TABS
1.0000 | ORAL_TABLET | Freq: Four times a day (QID) | ORAL | Status: DC | PRN
  Administered 2013-05-26 – 2013-05-28 (×4): 1 via ORAL
  Filled 2013-05-26 (×4): qty 1

## 2013-05-26 MED ORDER — ONDANSETRON HCL 4 MG/2ML IJ SOLN
4.0000 mg | Freq: Once | INTRAMUSCULAR | Status: AC
  Administered 2013-05-26: 4 mg via INTRAVENOUS
  Filled 2013-05-26: qty 2

## 2013-05-26 MED ORDER — PANTOPRAZOLE SODIUM 40 MG PO TBEC
40.0000 mg | DELAYED_RELEASE_TABLET | Freq: Every day | ORAL | Status: DC
  Administered 2013-05-26 – 2013-05-29 (×4): 40 mg via ORAL
  Filled 2013-05-26 (×3): qty 1

## 2013-05-26 MED ORDER — IOHEXOL 300 MG/ML  SOLN
25.0000 mL | INTRAMUSCULAR | Status: AC
  Administered 2013-05-26: 25 mL via ORAL

## 2013-05-26 MED ORDER — PANTOPRAZOLE SODIUM 40 MG PO TBEC: 40.0000 mg | DELAYED_RELEASE_TABLET | Freq: Every day | ORAL | Status: DC

## 2013-05-26 MED ORDER — POTASSIUM CHLORIDE ER 10 MEQ PO TBCR
10.0000 meq | EXTENDED_RELEASE_TABLET | Freq: Every morning | ORAL | Status: DC
  Administered 2013-05-27 – 2013-05-29 (×3): 10 meq via ORAL
  Filled 2013-05-26 (×4): qty 1

## 2013-05-26 MED ORDER — ADULT MULTIVITAMIN W/MINERALS CH
1.0000 | ORAL_TABLET | Freq: Every morning | ORAL | Status: DC
  Administered 2013-05-27 – 2013-05-28 (×2): 1 via ORAL
  Filled 2013-05-26 (×3): qty 1

## 2013-05-26 MED ORDER — IOHEXOL 300 MG/ML  SOLN
80.0000 mL | Freq: Once | INTRAMUSCULAR | Status: AC | PRN
  Administered 2013-05-26: 70 mL via INTRAVENOUS

## 2013-05-26 MED ORDER — ONDANSETRON HCL 4 MG/2ML IJ SOLN: 4.0000 mg | Freq: Four times a day (QID) | INTRAMUSCULAR | Status: DC | PRN

## 2013-05-26 MED ORDER — ONDANSETRON HCL 4 MG PO TABS
4.0000 mg | ORAL_TABLET | Freq: Four times a day (QID) | ORAL | Status: DC | PRN
  Filled 2013-05-26: qty 1

## 2013-05-26 MED ORDER — MORPHINE SULFATE 4 MG/ML IJ SOLN
4.0000 mg | Freq: Once | INTRAMUSCULAR | Status: DC
  Administered 2013-05-27: 1 mg via INTRAVENOUS
  Filled 2013-05-26: qty 1

## 2013-05-26 MED ORDER — MORPHINE SULFATE 2 MG/ML IJ SOLN
1.0000 mg | INTRAMUSCULAR | Status: DC | PRN
  Administered 2013-05-27: 1 mg via INTRAVENOUS
  Filled 2013-05-26 (×2): qty 1

## 2013-05-26 MED ORDER — CIPROFLOXACIN HCL 500 MG PO TABS
500.0000 mg | ORAL_TABLET | Freq: Every day | ORAL | Status: AC
  Administered 2013-05-26 – 2013-05-28 (×3): 500 mg via ORAL
  Filled 2013-05-26 (×4): qty 1

## 2013-05-26 MED ORDER — POLYETHYLENE GLYCOL 3350 17 G PO PACK
17.0000 g | PACK | Freq: Every day | ORAL | Status: DC
  Administered 2013-05-26 – 2013-05-28 (×3): 17 g via ORAL
  Filled 2013-05-26 (×4): qty 1

## 2013-05-26 MED ORDER — DIAZEPAM 2 MG PO TABS
2.0000 mg | ORAL_TABLET | Freq: Every evening | ORAL | Status: DC | PRN
  Administered 2013-05-26 – 2013-05-27 (×2): 2 mg via ORAL
  Filled 2013-05-26 (×2): qty 1

## 2013-05-26 MED ORDER — ONDANSETRON HCL 4 MG/2ML IJ SOLN: 4.0000 mg | Freq: Three times a day (TID) | INTRAMUSCULAR | Status: DC | PRN

## 2013-05-26 MED ORDER — ACETAMINOPHEN 650 MG RE SUPP: 650.0000 mg | Freq: Four times a day (QID) | RECTAL | Status: DC | PRN

## 2013-05-26 MED ORDER — SODIUM CHLORIDE 0.9 % IV SOLN
INTRAVENOUS | Status: DC
  Administered 2013-05-26: 14:00:00 via INTRAVENOUS

## 2013-05-26 MED ORDER — HYDROMORPHONE HCL PF 1 MG/ML IJ SOLN: 1.0000 mg | INTRAMUSCULAR | Status: DC | PRN

## 2013-05-26 MED ORDER — ACETAMINOPHEN 325 MG PO TABS: 650.0000 mg | ORAL_TABLET | Freq: Four times a day (QID) | ORAL | Status: DC | PRN

## 2013-05-26 MED ORDER — SODIUM CHLORIDE 0.9 % IV BOLUS (SEPSIS)
1000.0000 mL | Freq: Once | INTRAVENOUS | Status: AC
  Administered 2013-05-26: 1000 mL via INTRAVENOUS

## 2013-05-26 MED ORDER — OMEGA-3-ACID ETHYL ESTERS 1 G PO CAPS
1.0000 g | ORAL_CAPSULE | Freq: Every day | ORAL | Status: DC
  Administered 2013-05-27 – 2013-05-28 (×2): 1 g via ORAL
  Filled 2013-05-26 (×2): qty 1

## 2013-05-26 MED ORDER — MORPHINE SULFATE 4 MG/ML IJ SOLN
4.0000 mg | Freq: Once | INTRAMUSCULAR | Status: AC
  Administered 2013-05-26: 4 mg via INTRAVENOUS
  Filled 2013-05-26: qty 1

## 2013-05-26 MED ORDER — SODIUM CHLORIDE 0.9 % IV SOLN
INTRAVENOUS | Status: AC
  Administered 2013-05-27: 06:00:00 via INTRAVENOUS

## 2013-05-26 NOTE — Progress Notes (Signed)
Met patient at bedside.Role of CM explained.Patient reports she is from [facility] and she is finding it harder to manage.Patient reports her son will be back in ED shortly. CM explained she will liaise with her son and the Education officer, museum.Patient reports her understanding.Patient reports today's ED visit is secondary to increased abdominal pain.

## 2013-05-26 NOTE — ED Provider Notes (Signed)
CSN: [id number]     Arrival date & time 05/26/13  4098 History   First MD Initiated Contact with Patient 05/26/13 0622     No chief complaint on file.    (Consider location/radiation/quality/duration/timing/severity/associated sxs/prior Treatment) HPI  80 year old female with history of CAD, arthritis who presents for evaluation of abdominal pain.  Pt arrived from [facility] and came to ER via [facility].  History obtained through patient and through her son who is at bedside. Patient report intermittent abdominal pain for the past for 5 days. Describe pain as a sharp sensation, worsening in the morning, would last for several hours. Reports nausea without vomiting or diarrhea. Report last bowel movement was yesterday but has been complaining of constipation for the past several weeks. She is currently taking pain medication following recent low back and hip injury. Reports decrease in appetite. Denies any fever, chills, chest pain, shortness of breath, productive cough, dysuria, new numbness or weakness. Does not recall her last colonoscopy. Patient had a left femur fracture repaired by open reduction internal fixation in September of 2014, but no recent surgery. Has prior abdominal surgery including cesarean section. No history of AAA. Patient is currently taking Cipro for the past 3 days for small wound to the left ankle and left hip  Past Medical History  Diagnosis Date  . Arthritis   . Coronary artery disease    Past Surgical History  Procedure Laterality Date  . Hip fracture surgery      x 3  . Orif femur fracture Left 12/13/2012    Procedure: OPEN REDUCTION INTERNAL FIXATION (ORIF) DISTAL FEMUR FRACTURE;  Surgeon: Mauri Pole, MD;  Location: [facility]S;  Service: Orthopedics;  Laterality: Left;   No family history on file. History  Substance Use Topics  . Smoking status: Never Smoker   . Smokeless tobacco: Not on file  . Alcohol Use: Yes   OB History   Grav Para Term  Preterm Abortions TAB SAB Ect Mult Living                 Review of Systems  Constitutional: Negative for fever.  Respiratory: Negative for chest tightness and shortness of breath.   Cardiovascular: Negative for chest pain.  Gastrointestinal: Positive for abdominal pain.  Skin: Negative for rash.  All other systems reviewed and are negative.      Allergies  Penicillins  Home Medications   Current Outpatient Rx  Name  Route  Sig  Dispense  Refill  . cholecalciferol (VITAMIN D) 1000 UNITS tablet   Oral   Take 1,000 Units by mouth every morning.         . docusate sodium 100 MG CAPS   Oral   Take 100 mg by mouth 2 (two) times daily.   10 capsule   0   . ferrous sulfate 325 (65 FE) MG tablet   Oral   Take 1 tablet (325 mg total) by mouth 3 (three) times daily after meals.      3   . fish oil-omega-3 fatty acids 1000 MG capsule   Oral   Take 1 g by mouth every morning.         . furosemide (LASIX) 40 MG tablet   Oral   Take 40 mg by mouth every morning.          . magnesium oxide (MAG-OX) 400 MG tablet   Oral   Take 400 mg by mouth every evening.         Marland Kitchen  Multiple Vitamin (MULTIVITAMIN WITH MINERALS) TABS   Oral   Take 1 tablet by mouth every morning.          . Multiple Vitamins-Minerals (ICAPS AREDS FORMULA PO)   Oral   Take 1 capsule by mouth every morning.         Marland Kitchen omeprazole (PRILOSEC) 40 MG capsule   Oral   Take 40 mg by mouth every morning.          Marland Kitchen oxyCODONE (OXY IR/ROXICODONE) 5 MG immediate release tablet      Take one tablet by mouth every eight hours for pain; Take one tablet by mouth every 4 hours as needed for pain 1-5; Take two tablets by mouth every 4 hours as needed for pain 6-10   360 tablet   0   . Oyster Shell (OYSTER CALCIUM) 500 MG TABS tablet   Oral   Take 500 mg of elemental calcium by mouth every morning.         . polyethylene glycol (MIRALAX / GLYCOLAX) packet   Oral   Take 17 g by mouth daily as  needed.   14 each   0   . potassium chloride (K-DUR) 10 MEQ tablet   Oral   Take 10 mEq by mouth every morning.          . sertraline (ZOLOFT) 50 MG tablet   Oral   Take 50 mg by mouth every morning.          . triazolam (HALCION) 0.25 MG tablet   Oral   Take 0.25 mg by mouth at bedtime.          Marland Kitchen zolpidem (AMBIEN) 5 MG tablet   Oral   Take 1 tablet (5 mg total) by mouth at bedtime as needed for sleep.   30 tablet   5    BP 142/59  Pulse 84  Temp(Src) 98.6 F (37 C) (Oral)  Resp 14  SpO2 98% Physical Exam  Nursing note and vitals reviewed. Constitutional: She appears well-developed and well-nourished. No distress.  Frail-appearing elderly female appears to be in no acute distress.  HENT:  Head: Atraumatic.  Lips dry, oral mucosa dry.  Eyes: Conjunctivae are normal.  Neck: Neck supple.  Cardiovascular: Normal rate and regular rhythm.   Murmur (faint systolic murmur) heard. Pulmonary/Chest: Effort normal and breath sounds normal. No respiratory distress. She has no wheezes. She has no rales.  Abdominal: Soft. She exhibits no distension. There is tenderness (Mild  LUQ/LLQ abdominal tenderness without guarding or rebound tenderness. No overlying skin changes.). There is no rebound and no guarding.  Musculoskeletal: Normal range of motion. She exhibits no edema.  Dressing overlying the left ankle and foot.  Neurological: She is alert.  Skin: No rash noted.  Psychiatric: She has a normal mood and affect.    ED Course  Procedures (including critical care time)  7:00 AM Patient presents with recurrent abdominal pain. Pain is localized to the L side of abdomen. She has a nonsurgical abdomen, is afebrile with stable normal vital sign. Will obtain labs, UA, EKG, and also abdominal pelvis CT scan for further evaluation. Pain medication and antinausea medication given.  10:32 AM Abd/pelvis CT demonstrate biliary ductal dilatation, no other acute findings noted.  I  obtained Abd [place] which confirms biliary ductal dilation with CBD measuring up to 44mm.  Recommendation for MRCP were suggested. Given that pt has pain near pancreas and now with finding, plan to consult GI for further care.  Care discussed with Dr. Doy Mince.  Family member desire for pt to be admitted because he felt pt does not received adequate care at her living facility.  However, i do not think pt meets admission criteria.  Will consult social work for pt.   12:48 PM I have consulted with GI specialist, who recommend pt to be admitted for MRCP, Dr. Maurene Capes to see.  I have consulted with [facility], Dr. Dyann Kief, who agrees to admit pt to obs, team 10, GI floor, under his care.  Pt and family member notified for plan and agrees with plan.  Holding orders placed.    Labs Review Labs Reviewed  CBC WITH DIFFERENTIAL - Abnormal; Notable for the following:    RBC 3.69 (*)    All other components within normal limits  HEPATIC FUNCTION PANEL - Abnormal; Notable for the following:    Albumin 2.5 (*)    Alkaline Phosphatase 144 (*)    All other components within normal limits  URINALYSIS, ROUTINE W REFLEX MICROSCOPIC - Abnormal; Notable for the following:    APPearance CLOUDY (*)    Hgb urine dipstick MODERATE (*)    Leukocytes, UA MODERATE (*)    All other components within normal limits  URINE MICROSCOPIC-ADD ON - Abnormal; Notable for the following:    Squamous Epithelial / LPF MANY (*)    Bacteria, UA FEW (*)    Crystals CA OXALATE CRYSTALS (*)    All other components within normal limits  I-STAT CHEM 8, ED - Abnormal; Notable for the following:    Potassium 3.6 (*)    Glucose, Bld 124 (*)    All other components within normal limits  POC OCCULT BLOOD, ED - Abnormal; Notable for the following:    Fecal Occult Bld POSITIVE (*)    All other components within normal limits  URINE CULTURE  LIPASE, BLOOD  I-STAT TROPOININ, ED   Imaging Review US Abdomen Complete  05/26/2013   CLINICAL  DATA Rule out biliary obstruction.  Abdominal pain  EXAM ULTRASOUND ABDOMEN COMPLETE  COMPARISON CT abdomen from today.  MRCP 09/13/2009  FINDINGS Gallbladder:  Surgically absent gallbladder  Common bile duct:  Diameter: Biliary ductal dilatation. Intrahepatic bile ducts are dilated. Common bile duct measures up to 14 mm. No ductal stone identified. No obstructing mass identified however further anatomic evaluation with MRCP is suggested as there was a small cystic lesion in the head of the pancreas on the prior MRCP.  Liver:  No focal lesion identified. Within normal limits in parenchymal echogenicity.  IVC:  No abnormality visualized.  Pancreas:  Visualized portion unremarkable.  Spleen:  Size and appearance within normal limits.  Right Kidney:  Length: 10.5 cm. Echogenicity within normal limits. No mass or hydronephrosis visualized.  Left Kidney:  Length: 9.5 cm. Echogenicity within normal limits. No mass or hydronephrosis visualized.  Abdominal aorta:  No aneurysm visualized.  Other findings:  Small left pleural effusion  IMPRESSION Biliary ductal dilatation with the common bile duct measuring up to 14 mm. This could be due to a stone or stricture or mass. Follow-up MRCP suggested. There was a cystic lesion in the head of the pancreas on the MRCP of 09/13/2009.  SIGNATURE  Electronically Signed   By: Franchot Gallo M.D.   On: 05/26/2013 10:14   Ct Abdomen Pelvis W Contrast  05/26/2013   CLINICAL DATA Abdominal pain  EXAM CT ABDOMEN AND PELVIS WITH CONTRAST  TECHNIQUE Multidetector CT imaging of the abdomen and pelvis was performed  using the standard protocol following bolus administration of intravenous contrast.  CONTRAST 39mL OMNIPAQUE IOHEXOL 300 MG/ML  SOLN  COMPARISON CT 12/17/2007  FINDINGS Small left pleural effusion and left lower lobe atelectasis. Right lung base is clear.  Heart size is normal.  Mild coronary artery calcification.  Bile ducts are dilated. Intrahepatic and extrahepatic bile ducts are  dilated. Common hepatic duct measures 12 mm. Common bile duct measures 10 mm. This has increased significantly since the prior study and may reflect a common duct stone or stricture. No definite neoplasm. There is evidence of chronic calcific pancreatitis, unchanged. No evidence of acute pancreatitis. The spleen is normal.  The kidneys show no obstruction or mass.  Negative for bowel obstruction. Sigmoid diverticulosis. No free fluid in the pelvis. No mass or adenopathy.  IMPRESSION Small left pleural effusion and left lower lobe atelectasis  Biliary ductal dilatation. This may be due to a stone or stricture. There is evidence of chronic calcific pancreatitis but no evidence of acute appendicitis or mass lesion is identified.  SIGNATURE  Electronically Signed   By: Franchot Gallo M.D.   On: 05/26/2013 08:43     EKG Interpretation   Date/Time:  Wednesday May 26 2013 07:08:59 EDT Ventricular Rate:  77 PR Interval:  169 QRS Duration: 126 QT Interval:  417 QTC Calculation: 472 R Axis:   -71 Text Interpretation:  Sinus rhythm Consider right atrial enlargement IVCD,  consider atypical RBBB Left ventricular hypertrophy Anterior infarct, old  No significant change was found Confirmed by [facility]  MD, TREY ([id number]) on  05/26/2013 7:16:05 AM      MDM   Final diagnoses:  Abdominal pain    BP 109/52  Pulse 73  Temp(Src) 98.6 F (37 C) (Oral)  Resp 18  SpO2 98%  I have reviewed nursing notes and vital signs. I personally reviewed the imaging tests through PACS system  I reviewed available ER/hospitalization records thought the EMR     Domenic Moras, PA-C 05/26/13 1250

## 2013-05-26 NOTE — ED Notes (Signed)
Pt unable to use bedpan 

## 2013-05-26 NOTE — ED Notes (Signed)
Patient transported to Ultrasound 

## 2013-05-26 NOTE — ED Notes (Signed)
Patient transported to CT 

## 2013-05-26 NOTE — ED Notes (Signed)
Rona Ravens PA at bedside.

## 2013-05-26 NOTE — ED Notes (Signed)
MD at bedside. 

## 2013-05-26 NOTE — ED Notes (Signed)
Social work at bedside.  

## 2013-05-26 NOTE — Consult Note (Signed)
[facility] Consult: 12:41 PM 05/26/2013  LOS: 0 days    Referring Provider: Dr Doy Mince in ED Primary Care Physician:  Tommy Medal, MD Primary Gastroenterologist:  Dr. Deatra Ina   No office notes found    Reason for Consultation:  Nausea and vomiting.  Dilated bile duct   HPI: [name] is a 80 y.o. female.  Hx of hp fractures and surgeries to hips.  Came to ED with left abdominal pain.     For at least one month having vague to moderate level pain in left abdomen and anorexia.  This is not post prandial. It is worse when moving.  Occasional N/V after meds. 2 to 3 BMs per week, on daily vicodin.  No GI bleeding.    CT scan and ultrasound show dilated intrahepatic and extrahepatic bile duct. Common hepatic duct measures 14 mm. Common bile duct measures 10 mm.  This is increased /w  Ct of 2009.  Alk phos is 144 , albumin 2.5 and lipase is 24, otherwise LFTs normal Potassium is 3.6.  Hospitalist will be admitting.   In 2009 had EGD for abd pain by Dr Deatra Ina.  Egd showed gastric inflammation and areas of subepithelial hemorrhage. Pathology was reactive gastropathy.  At that time she had alk phos of 72, lipase of 30 , amylase of 172, normal LFTs Had CT in 12/2007 showing slightly prominent bile duct which was likely normal in pt post cholecystectomy, Had an MRCP in 01/2008 showing normal for age biliary ductal dilatation, 2 cm multilobulated cystic lesion within the genu of the pancreas.Differential considerations include a pseudocyst of chronic pancreatitis versus intraductal papillary mucinous tumor or serous cystadenoma/adenocarcinoma.  Small cystic lesion in tail of pancreas.  She never underwent a follow up imaging. There are no GI office notes corresponding to these studies.   Nothing other than EGD,  MRCP found in Epic. There is no previous hx of biliary related admissions.    Post hip revision surgery anemia in 12/2004 and in 11/2012.  Currently hgb is 12.6 .  It was 8.5 in Sept 2014.   Past Medical History  Diagnosis Date  . Arthritis   . Coronary artery disease     Past Surgical History  Procedure Laterality Date  . Hip fracture surgery      x 3  . Orif femur fracture Left 12/13/2012    Procedure: OPEN REDUCTION INTERNAL FIXATION (ORIF) DISTAL FEMUR FRACTURE;  Surgeon: Mauri Pole, MD;  Location: [facility];  Service: Orthopedics;  Laterality: Left;  . Appendectomy    . Cholecystectomy      Prior to Admission medications   Medication Sig Start Date End Date Taking? Authorizing Provider  ciprofloxacin (CIPRO) 500 MG tablet Take 500 mg by mouth 2 (two) times daily.   Yes Historical Provider, MD  diazepam (VALIUM) 5 MG tablet Take 5 mg by mouth at bedtime as needed for anxiety (sleep).   Yes Historical Provider, MD  Multiple Vitamin (MULTIVITAMIN WITH MINERALS) TABS Take 1 tablet by mouth every morning.    Yes Historical Provider,  MD  Multiple Vitamins-Minerals (ICAPS AREDS FORMULA PO) Take 1 capsule by mouth every morning.   Yes Historical Provider, MD  omega-3 acid ethyl esters (LOVAZA) 1 G capsule Take 1 g by mouth daily.   Yes Historical Provider, MD  omeprazole (PRILOSEC) 40 MG capsule Take 40 mg by mouth every morning.    Yes Historical Provider, MD  OVER THE COUNTER MEDICATION Take 1 tablet by mouth daily. Macular Degeneration Supplement   Yes Historical Provider, MD  oxyCODONE (OXY IR/ROXICODONE) 5 MG immediate release tablet Take one tablet by mouth every eight hours for pain; Take one tablet by mouth every 4 hours as needed for pain 1-5; Take two tablets by mouth every 4 hours as needed for pain 6-10 03/22/13  Yes Tiffany L Reed, DO  potassium chloride (K-DUR) 10 MEQ tablet Take 10 mEq by mouth every morning.    Yes Historical Provider, MD    Scheduled  Meds:  Infusions:  PRN Meds:      Allergies as of 05/26/2013 - Review Complete 05/26/2013  Allergen Reaction Noted  . Penicillins Other (See Comments)     No family history on file.  History   Social History  . Marital Status: Widowed    Spouse Name: N/A    Number of Children: N/A  . Years of Education: N/A   Occupational History  . Not on file.   Social History Main Topics  . Smoking status: Never Smoker   . Smokeless tobacco: Not on file  . Alcohol Use: Yes  . Drug Use: No  . Sexual Activity: Not on file   Other Topics Concern  . Not on file   Social History Narrative  . No narrative on file    REVIEW OF SYSTEMS: Constitutional:  > 10 # weight loss in last few months.  Requires >one person assist to move about ENT:  No nose bleeds Pulm:  No SOB, no Cough CV:  No palpitations, no LE edema.  GU:  No hematuria, no frequency GI:  Per HPI Heme:  Per HPI   Transfusions:  Yes in past post hip surgery.  Neuro:  No headaches, no peripheral tingling or numbness Derm:  No itching, no rash or sores.  Endocrine:  No sweats or chills.  No polyuria or dysuria Immunization:  Flu, pneumovax up to date.  Travel:  None beyond local counties in last few months.    PHYSICAL EXAM: Vital signs in last 24 hours: Filed Vitals:   05/26/13 1214  BP: 109/52  Pulse: 73  Temp:   Resp: 18   Wt Readings from Last 3 Encounters:  12/13/12 68.04 kg (150 lb)  12/13/12 68.04 kg (150 lb)  09/11/09 66.86 kg (147 lb 6.4 oz)   General: frail, aged WF.  Comfortable at rest. Head:  No asymmetry or swelling  Eyes:  No icterus or pallor Ears:  Slightly HOH  Nose:  No discharge Mouth:  Clear, dry MM.  No lesions Neck:  No JVD, no TMG Lungs:  Crackles in base, no cough.  No dyspnea Heart: RRR Abdomen:  Soft, mild tenderness in multiple locations.  No guard or rebound.  No distention. No mass or HSM.  BS active.   Rectal: deferred.  + skin breakdown of sacrum   Musc/Skeltl: hip  scar is well healed,  Extremities:  + left pedal edema, ace wrap of left heal and dorsum of foot  Neurologic:  Oriented x 3.  No gross deficits.  No tremor.   Skin:  No rash, no telangectasis Tattoos:  none Nodes:  No cervical adenopathy.    Psych:  Cooperative, alert.   Intake/Output from previous day:   Intake/Output this shift:    LAB RESULTS:  Recent Labs  05/26/13 0708 05/26/13 0718  WBC 7.3  --   HGB 12.0 12.6  HCT 36.6 37.0  PLT 245  --    BMET Lab Results  Component Value Date   NA 140 05/26/2013   NA 137 12/17/2012   NA 136 12/16/2012   K 3.6* 05/26/2013   K 3.1* 12/17/2012   K 4.4 12/16/2012   CL 100 05/26/2013   CL 106 12/17/2012   CL 105 12/16/2012   CO2 27 12/17/2012   CO2 25 12/16/2012   CO2 25 12/15/2012   GLUCOSE 124* 05/26/2013   GLUCOSE 121* 12/17/2012   GLUCOSE 142* 12/16/2012   BUN 15 05/26/2013   BUN 23 12/17/2012   BUN 30* 12/16/2012   CREATININE 1.00 05/26/2013   CREATININE 0.71 12/17/2012   CREATININE 0.99 12/16/2012   CALCIUM 7.9* 12/17/2012   CALCIUM 8.0* 12/16/2012   CALCIUM 8.2* 12/15/2012   LFT  Recent Labs  05/26/13 0708  PROT 6.5  ALBUMIN 2.5*  AST 27  ALT 13  ALKPHOS 144*  BILITOT 0.5  BILIDIR <0.2  IBILI NOT CALCULATED   PT/INR Lab Results  Component Value Date   INR 1.10 12/13/2012   INR 0.95 12/12/2012   INR 1.03 06/27/2012   Hepatitis Panel No results found for this basename: HEPBSAG, HCVAB, HEPAIGM, HEPBIGM,  in the last 72 hours C-Diff No components found with this basename: cdiff   Lipase     Component Value Date/Time   LIPASE 24 05/26/2013 0708   RADIOLOGY STUDIES: US Abdomen Complete 05/26/2013    COMPARISON CT abdomen from today.  MRCP 09/13/2009  FINDINGS Gallbladder:  Surgically absent gallbladder  Common bile duct:  Diameter: Biliary ductal dilatation. Intrahepatic bile ducts are dilated. Common bile duct measures up to 14 mm. No ductal stone identified. No obstructing mass identified however further anatomic  evaluation with MRCP is suggested as there was a small cystic lesion in the head of the pancreas on the prior MRCP.  Liver:  No focal lesion identified. Within normal limits in parenchymal echogenicity.  IVC:  No abnormality visualized.  Pancreas:  Visualized portion unremarkable.  Spleen:  Size and appearance within normal limits.  Right Kidney:  Length: 10.5 cm. Echogenicity within normal limits. No mass or hydronephrosis visualized.  Left Kidney:  Length: 9.5 cm. Echogenicity within normal limits. No mass or hydronephrosis visualized.  Abdominal aorta:  No aneurysm visualized.  Other findings:  Small left pleural effusion  IMPRESSION Biliary ductal dilatation with the common bile duct measuring up to 14 mm. This could be due to a stone or stricture or mass. Follow-up MRCP suggested. There was a cystic lesion in the head of the pancreas on the MRCP of 09/13/2009.  SIGNATURE  Electronically Signed   By: Franchot Gallo M.D.   On: 05/26/2013 10:14   Ct Abdomen Pelvis W Contrast 05/26/2013    COMPARISON CT 12/17/2007  FINDINGS Small left pleural effusion and left lower lobe atelectasis. Right lung base is clear.  Heart size is normal.  Mild coronary artery calcification.  Bile ducts are dilated. Intrahepatic and extrahepatic bile ducts are dilated. Common hepatic duct measures 12 mm. Common bile duct measures 10 mm. This has increased significantly since the prior study and may reflect a common duct stone or stricture.  No definite neoplasm. There is evidence of chronic calcific pancreatitis, unchanged. No evidence of acute pancreatitis. The spleen is normal.  The kidneys show no obstruction or mass.  Negative for bowel obstruction. Sigmoid diverticulosis. No free fluid in the pelvis. No mass or adenopathy.  IMPRESSION Small left pleural effusion and left lower lobe atelectasis  Biliary ductal dilatation. This may be due to a stone or stricture. There is evidence of chronic calcific pancreatitis but no evidence of  acute appendicitis or mass lesion is identified.  SIGNATURE  Electronically Signed   By: Franchot Gallo M.D.   On: 05/26/2013 08:43    ENDOSCOPIC STUDIES: 12/2007  EGD for abdominal pain.  Findings  - Normal: Proximal Esophagus to Body.  - Normal: Duodenal Bulb to Duodenal 2nd Portion.  - MUCOSAL ABNORMALITY: Antrum. Erythematous mucosa. Subepithelial  hemorrhage present. Biopsy/Mucosal Abn taken. ICD9: Gastritis,  Unspecified: 535.50. Comment: Throughout the antrum there was mildly  inflamed mucosa with multiple areas of subepithelial hemorrhagic  mucosa.  Assessment  Abnormal examination, see findings above.  Diagnoses:  535.50: Gastritis, Unspecified.   Pathology STOMACH, BIOPSY:  - REACTIVE GASTROPATHY.  - THERE IS NO EVIDENCE OF INTESTINAL METAPLASIA, DYSPLASIA,  OR MALIGNANCY.  - SEE COMMENT.  COMMENT  This pattern can be associated with nonsteroidal  anti-inflammatory drugs (NSAIDS), alcohol or with other causes of  chemical gastropathy. Helicobacter pylori are not identified  with Warthin-Starry stain. The control stained appropriately.  2005  Colonoscopy  Dr Lajoyce Corners FINDINGS: Diverticulosis of the sigmoid colon, internal hemorrhoids,  otherwise, unremarkable exam.    IMPRESSION:   *  Left abd pain, elevated alk phos and dilated bile ducts. No mass seen causing obstruction.   *  S/p ORIF left hip fracture 11/2012.   Released from [facility] ~ Mar 24 2012. The elevted alk phos might be sequela of hip fracture  *  Sacral skin breakdown. Left foot wound.  Started on Cipro by Dr Minna Antis yesterday.  *  Hx GERD.  Uses Prilosec at home.     PLAN:     *  ? MRCP (will she tolerate this?) *  Ca 19-9 in AM *  Once daily oral PPI    Azucena Freed  05/26/2013, 12:41 PM Pager: [phone] Attending MD note:   I have taken a history, examined the patient, and reviewed the chart. I agree with the Advanced Practitioner's impression and recommendations. 80 yo with prior  hx of pancreatic cystic lesion, now  With progressive biliary dilation in the setting of minimally elevated LFT's. She is having pain tonight which resembles pancreatic pain. For MRCP in am. Ca19-9 pending. Gamma GGT ordered to determine if alk phosph elevation is biliary.  [facility] Pager # [phone]

## 2013-05-26 NOTE — Progress Notes (Signed)
CM and Social worker met patients Son [name].Role of CM explained.Son explains patient is needing more assistance than the current ALF can provide and there goal is SNF Placement.SW educated son on difficulties of SNF placement via ED.Son reports his understanding.Son reports he has taken his mother for regular PCP visits with Dr Minna Antis And has introduced Home health services via [facility] instead due to his mothers Increased needs.Son reports he is in process of looking at SNF options.CM liaised with PA Domenic Moras regarding patients plan of care.Await Gastro consult.Patient and family are aware of plan of care.Liaised with SW regarding plan of care.Will continue to follow and  Provide assist.Son reports he is hopeful his mother will be admitted CM educated son that CM will continue to provide assistance as needed.Son receptive to education and update.

## 2013-05-26 NOTE — ED Notes (Signed)
Clare Gandy (son) [phone]

## 2013-05-26 NOTE — H&P (Signed)
[facility] History and Physical  [name] ZOX:[id number] DOB: 1915-08-23 DOA: 05/26/2013  Referring physician: Dr. Reather Converse PCP: Tommy Medal, MD   Chief Complaint: abd pain, nausea  HPI: [name] is a 80 y.o. female with pmh significant for GERD, left hip fracture s/p ORIF; diverticulosis, internal hemorrhoids and OA; came to ED complaining of abdominal pain and nausea. Patient denies any fever, no vomiting, no SOB and CP. Patient in Ed was found with abnormal CBD dilatation on CT abd/pelvis and positive FOBT. Normal LFT's.  Of note, patient recently started on ciprofloxacin for UTI (05/24/13); otherwise exam and blood work negative. [facility] called to admit patient for further evaluation and treatment.   Review of Systems:  Negative except as mentioned on HPI.  Past Medical History  Diagnosis Date  . Arthritis   . Coronary artery disease   . GERD (gastroesophageal reflux disease)    Past Surgical History  Procedure Laterality Date  . Hip fracture surgery      x 3  . Orif femur fracture Left 12/13/2012    Procedure: OPEN REDUCTION INTERNAL FIXATION (ORIF) DISTAL FEMUR FRACTURE;  Surgeon: Mauri Pole, MD;  Location: [facility];  Service: Orthopedics;  Laterality: Left;  . Appendectomy    . Cholecystectomy     Social History:  reports that she has never smoked. She has never used smokeless tobacco. She reports that she does not drink alcohol or use illicit drugs.  Allergies  Allergen Reactions  . Penicillins Other (See Comments)    Childhood allergy; reaction unknown    History reviewed. No pertinent family history.   Prior to Admission medications   Medication Sig Start Date End Date Taking? Authorizing Provider  ciprofloxacin (CIPRO) 500 MG tablet Take 500 mg by mouth 2 (two) times daily.   Yes Historical Provider, MD  diazepam (VALIUM) 5 MG tablet Take 5 mg by mouth at bedtime as needed for anxiety (sleep).   Yes Historical Provider, MD  Multiple Vitamin  (MULTIVITAMIN WITH MINERALS) TABS Take 1 tablet by mouth every morning.    Yes Historical Provider, MD  Multiple Vitamins-Minerals (ICAPS AREDS FORMULA PO) Take 1 capsule by mouth every morning.   Yes Historical Provider, MD  omega-3 acid ethyl esters (LOVAZA) 1 G capsule Take 1 g by mouth daily.   Yes Historical Provider, MD  omeprazole (PRILOSEC) 40 MG capsule Take 40 mg by mouth every morning.    Yes Historical Provider, MD  OVER THE COUNTER MEDICATION Take 1 tablet by mouth daily. Macular Degeneration Supplement   Yes Historical Provider, MD  oxyCODONE (OXY IR/ROXICODONE) 5 MG immediate release tablet Take one tablet by mouth every eight hours for pain; Take one tablet by mouth every 4 hours as needed for pain 1-5; Take two tablets by mouth every 4 hours as needed for pain 6-10 03/22/13  Yes Tiffany L Reed, DO  potassium chloride (K-DUR) 10 MEQ tablet Take 10 mEq by mouth every morning.    Yes Historical Provider, MD   Physical Exam: Filed Vitals:   05/26/13 1440  BP: 133/59  Pulse: 75  Temp: 98.4 F (36.9 C)  Resp: 18    BP 133/59  Pulse 75  Temp(Src) 98.4 F (36.9 C) (Oral)  Resp 18  Ht 4\' 8"  (1.422 m)  Wt 58.06 kg (128 lb)  BMI 28.71 kg/m2  SpO2 100%  General:  Appears calm and comfortable; no fever  Eyes: PERRL, normal lids, irises & conjunctiva, no nystagmus ENT: hard of hearing, lips &  tongue w/o erythema or exudates; no thrush Neck: no LAD, masses or thyromegaly, no JVD Cardiovascular: RRR, no m/r/g. No LE edema. Respiratory: CTA bilaterally, no w/r/r. Normal respiratory effort. Abdomen: soft, LLQ tenderness to palpation, no guarding, positive BS Skin: left foot with heel ulcer; no suppurative; also with stage 2 decubitus ulcer; no other rash, petechiae or induration seen on exam Musculoskeletal: grossly normal tone BUE; decrease range of motion on left leg due to  left  ORIF. Psychiatric: grossly normal mood and affect, speech fluent and appropriate Neurologic: grossly  non-focal.          Labs on Admission:  Basic Metabolic Panel:  Recent Labs Lab 05/26/13 0718  NA 140  K 3.6*  CL 100  GLUCOSE 124*  BUN 15  CREATININE 1.00   Liver Function Tests:  Recent Labs Lab 05/26/13 0708  AST 27  ALT 13  ALKPHOS 144*  BILITOT 0.5  PROT 6.5  ALBUMIN 2.5*    Recent Labs Lab 05/26/13 0708  LIPASE 24   CBC:  Recent Labs Lab 05/26/13 0708 05/26/13 0718  WBC 7.3  --   NEUTROABS 4.4  --   HGB 12.0 12.6  HCT 36.6 37.0  MCV 99.2  --   PLT 245  --    Radiological Exams on Admission: US Abdomen Complete  05/26/2013   CLINICAL DATA Rule out biliary obstruction.  Abdominal pain  EXAM ULTRASOUND ABDOMEN COMPLETE  COMPARISON CT abdomen from today.  MRCP 09/13/2009  FINDINGS Gallbladder:  Surgically absent gallbladder  Common bile duct:  Diameter: Biliary ductal dilatation. Intrahepatic bile ducts are dilated. Common bile duct measures up to 14 mm. No ductal stone identified. No obstructing mass identified however further anatomic evaluation with MRCP is suggested as there was a small cystic lesion in the head of the pancreas on the prior MRCP.  Liver:  No focal lesion identified. Within normal limits in parenchymal echogenicity.  IVC:  No abnormality visualized.  Pancreas:  Visualized portion unremarkable.  Spleen:  Size and appearance within normal limits.  Right Kidney:  Length: 10.5 cm. Echogenicity within normal limits. No mass or hydronephrosis visualized.  Left Kidney:  Length: 9.5 cm. Echogenicity within normal limits. No mass or hydronephrosis visualized.  Abdominal aorta:  No aneurysm visualized.  Other findings:  Small left pleural effusion  IMPRESSION Biliary ductal dilatation with the common bile duct measuring up to 14 mm. This could be due to a stone or stricture or mass. Follow-up MRCP suggested. There was a cystic lesion in the head of the pancreas on the MRCP of 09/13/2009.  SIGNATURE  Electronically Signed   By: Franchot Gallo M.D.   On:  05/26/2013 10:14   Ct Abdomen Pelvis W Contrast  05/26/2013   CLINICAL DATA Abdominal pain  EXAM CT ABDOMEN AND PELVIS WITH CONTRAST  TECHNIQUE Multidetector CT imaging of the abdomen and pelvis was performed using the standard protocol following bolus administration of intravenous contrast.  CONTRAST 52mL OMNIPAQUE IOHEXOL 300 MG/ML  SOLN  COMPARISON CT 12/17/2007  FINDINGS Small left pleural effusion and left lower lobe atelectasis. Right lung base is clear.  Heart size is normal.  Mild coronary artery calcification.  Bile ducts are dilated. Intrahepatic and extrahepatic bile ducts are dilated. Common hepatic duct measures 12 mm. Common bile duct measures 10 mm. This has increased significantly since the prior study and may reflect a common duct stone or stricture. No definite neoplasm. There is evidence of chronic calcific pancreatitis, unchanged. No evidence of acute  pancreatitis. The spleen is normal.  The kidneys show no obstruction or mass.  Negative for bowel obstruction. Sigmoid diverticulosis. No free fluid in the pelvis. No mass or adenopathy.  IMPRESSION Small left pleural effusion and left lower lobe atelectasis  Biliary ductal dilatation. This may be due to a stone or stricture. There is evidence of chronic calcific pancreatitis but no evidence of acute appendicitis or mass lesion is identified.  SIGNATURE  Electronically Signed   By: Franchot Gallo M.D.   On: 05/26/2013 08:43    Assessment/Plan 1-LLQ Abdominal pain and nausea: etiology unclear at this moment; could be referral pain from left hip fracture and constipation. -will provide gentle hydration -full liquid diet -start mild laxatives -patient to be seen by GI service; will follow any other rec's  2-partially treated UTI -will continue cipro BID for 3 more days -no dysuria at this moment  3-Esophageal reflux: continue PPI  4-positive FOBT: stable Hgb. -most likely due to hemorrhoids and/or diverticulosis -last colonoscopy  in 2005 -will follow rec;s from GI in this matter  4-Constipation: will start miralax  5-CBD dilatation: normal LFT's -etiology unclear -will follow GI rec's -follow MRCP  5-Ulcer of left heel and Decubitus ulcer limited to breakdown of skin (stage 2): wound care service has been consulted' will follow rec's  6-DVT: SCD's  Code Status: DNR Family Communication: son and daughter at bedside Disposition Plan: observation; LOS < 2 midnights, med-surg bed  Time spent: 80 minutes  Zerita Boers [facility] Pager [phone]

## 2013-05-26 NOTE — ED Notes (Signed)
Pt. arrived with [facility] from [facility]  reports intermittent left abdominal pain with nausea for several days worse this morning , denies diarrhea or emesis . No fever or chills.

## 2013-05-26 NOTE — ED Provider Notes (Signed)
Medical screening examination/treatment/procedure(s) were conducted as a shared visit with non-physician practitioner(s) and myself.  I personally evaluated the patient during the encounter.   EKG Interpretation   Date/Time:  Wednesday May 26 2013 07:08:59 EDT Ventricular Rate:  77 PR Interval:  169 QRS Duration: 126 QT Interval:  417 QTC Calculation: 472 R Axis:   -71 Text Interpretation:  Sinus rhythm Consider right atrial enlargement IVCD,  consider atypical RBBB Left ventricular hypertrophy Anterior infarct, old  No significant change was found Confirmed by [facility]  MD, TREY ([id number]) on  05/26/2013 7:16:33 AM      80 year old female presenting with left-sided abdominal pain. She reports that she gets these episodes of pain frequently, usually in the morning. Today, it was much worse than usual. On exam, well appearing, nontoxic, abdomen soft, tender in left upper quadrant and left lower quadrant, no rigidity, rebound, or guarding. Plan labs and CT.  Ultimately obtained RUQ [place] and admitted for further biliary workup.  Clinical Impression: 1. Abdominal pain      Houston Siren III, MD 05/27/13 [phone]

## 2013-05-27 ENCOUNTER — Observation Stay (HOSPITAL_COMMUNITY): Payer: Medicare Other

## 2013-05-27 DIAGNOSIS — R109 Unspecified abdominal pain: Secondary | ICD-10-CM

## 2013-05-27 DIAGNOSIS — E860 Dehydration: Secondary | ICD-10-CM

## 2013-05-27 DIAGNOSIS — IMO0002 Reserved for concepts with insufficient information to code with codable children: Secondary | ICD-10-CM

## 2013-05-27 DIAGNOSIS — E43 Unspecified severe protein-calorie malnutrition: Secondary | ICD-10-CM | POA: Diagnosis present

## 2013-05-27 DIAGNOSIS — K838 Other specified diseases of biliary tract: Secondary | ICD-10-CM

## 2013-05-27 DIAGNOSIS — R5381 Other malaise: Secondary | ICD-10-CM

## 2013-05-27 DIAGNOSIS — M4850XA Collapsed vertebra, not elsewhere classified, site unspecified, initial encounter for fracture: Secondary | ICD-10-CM | POA: Diagnosis present

## 2013-05-27 LAB — BASIC METABOLIC PANEL
BUN: 11 mg/dL (ref 6–23)
CO2: 26 mEq/L (ref 19–32)
Calcium: 8.8 mg/dL (ref 8.4–10.5)
Chloride: 104 mEq/L (ref 96–112)
Creatinine, Ser: 0.64 mg/dL (ref 0.50–1.10)
GFR calc non Af Amer: 73 mL/min — ABNORMAL LOW (ref >90)
GFR, EST AFRICAN AMERICAN: 85 mL/min — AB (ref >90)
GLUCOSE: 85 mg/dL (ref 70–99)
POTASSIUM: 3.9 meq/L (ref 3.7–5.3)
Sodium: 140 mEq/L (ref 137–147)

## 2013-05-27 LAB — CBC
HCT: 35.9 % — ABNORMAL LOW (ref 36.0–46.0)
Hemoglobin: 11.3 g/dL — ABNORMAL LOW (ref 12.0–15.0)
MCH: 32.1 pg (ref 26.0–34.0)
MCHC: 31.5 g/dL (ref 30.0–36.0)
MCV: 102 fL — ABNORMAL HIGH (ref 78.0–100.0)
Platelets: 205 10*3/uL (ref 150–400)
RBC: 3.52 MIL/uL — ABNORMAL LOW (ref 3.87–5.11)
RDW: 14.8 % (ref 11.5–15.5)
WBC: 8.5 10*3/uL (ref 4.0–10.5)

## 2013-05-27 LAB — CANCER ANTIGEN 19-9: CA 19-9: 14.9 U/mL — ABNORMAL LOW (ref <35.0)

## 2013-05-27 LAB — GAMMA GT: GGT: 45 U/L (ref 7–51)

## 2013-05-27 MED ORDER — COLLAGENASE 250 UNIT/GM EX OINT
TOPICAL_OINTMENT | Freq: Every day | CUTANEOUS | Status: DC
  Administered 2013-05-27 – 2013-05-29 (×3): via TOPICAL
  Filled 2013-05-27: qty 30

## 2013-05-27 MED ORDER — ENSURE COMPLETE PO LIQD
237.0000 mL | Freq: Two times a day (BID) | ORAL | Status: DC
  Administered 2013-05-27: 237 mL via ORAL

## 2013-05-27 MED ORDER — GADOBENATE DIMEGLUMINE 529 MG/ML IV SOLN
9.0000 mL | Freq: Once | INTRAVENOUS | Status: AC | PRN
  Administered 2013-05-27: 9 mL via INTRAVENOUS

## 2013-05-27 MED ORDER — POTASSIUM CHLORIDE IN NACL 20-0.9 MEQ/L-% IV SOLN
INTRAVENOUS | Status: DC
  Administered 2013-05-27 – 2013-05-29 (×4): via INTRAVENOUS
  Filled 2013-05-27 (×7): qty 1000

## 2013-05-27 MED ORDER — MORPHINE SULFATE 4 MG/ML IJ SOLN
INTRAMUSCULAR | Status: AC
  Administered 2013-05-27: 1 mg via INTRAVENOUS
  Filled 2013-05-27: qty 1

## 2013-05-27 NOTE — Progress Notes (Addendum)
INITIAL NUTRITION ASSESSMENT  DOCUMENTATION CODES Per approved criteria  -Severe malnutrition in the context of chronic illness   INTERVENTION: Ensure Complete po BID, each supplement provides 350 kcal and 13 grams of protein RD to follow for nutrition care plan  NUTRITION DIAGNOSIS: Increased nutrient needs related to wound healing as evidenced by estimated nutrition needs  Goal: Pt to meet >/= 90% of their estimated nutrition needs   Monitor:  PO & supplemental intake, weight, labs, I/O's  Reason for Assessment: Consult, Low Braden  80 y.o. female  Admitting Dx: Abdominal pain  ASSESSMENT: 80 y.o. female with PMH significant for GERD, left hip fracture s/p ORIF; diverticulosis, internal hemorrhoids and OA; came to ED complaining of abdominal pain and nausea. Patient denies any fever, no vomiting, no SOB and CP. Patient in ED was found with abnormal CBD dilatation on CT abd/pelvis and positive FOBT; admitted for further evaluation.  Patient reports she wasn't eating well PTA for several weeks; RD unable to determine how many meals she was consuming during this time; she stated there were times where she "threw it away;" noted pt s/p MBSS in October 2014 with dysphagia dx; placed on Dys 1--honey thick liquid diet; pt reported she does not have any difficulty swallowing.  CWOCN note reviewed, pt with deep tissue injury to sacrum & Stage 3 heel wound; per weight records, pt has had a 15% weight loss in < 6 months (since end of September 2014) -- severe for time frame.  Would benefit from addition of nutrition supplements -- RD to order.  Low braden score places patient at risk for further skin breakdown.  Nutrition Focused Physical Exam:  Subcutaneous Fat:  Orbital Region: N/A Upper Arm Region: moderate depletion Thoracic and Lumbar Region: N/A  Muscle:  Temple Region: WNL Clavicle Bone Region: severe depletion Clavicle and Acromion Bone Region: severe depletion Scapular  Bone Region: N/A Dorsal Hand: N/A Patellar Region: N/A Anterior Thigh Region: N/A Posterior Calf Region: N/A  Edema: none  Patient meets criteria for severe malnutrition in the context of chronic illness as evidenced by 15% weight loss in < 6 months and severe muscle loss.  Height: Ht Readings from Last 1 Encounters:  05/26/13 4\' 8"  (1.422 m)    Weight: Wt Readings from Last 1 Encounters:  05/26/13 128 lb (58.06 kg)    Ideal Body Weight: 93 lb  % Ideal Body Weight: 137%  Wt Readings from Last 10 Encounters:  05/26/13 128 lb (58.06 kg)  12/13/12 150 lb (68.04 kg)  12/13/12 150 lb (68.04 kg)  09/11/09 147 lb 6.4 oz (66.86 kg)  02/01/08 142 lb 6.1 oz (64.584 kg)  01/08/08 145 lb (65.772 kg)    Usual Body Weight: 150 lb  % Usual Body Weight: 85%  BMI:  Body mass index is 28.71 kg/(m^2).  Estimated Nutritional Needs: Kcal: 1250-1450 Protein: 75-85 gm Fluid: >/= 1.5 L  Skin: DTI to sacrum, Stage III heel wound  Diet Order: Soft, thin liquids  EDUCATION NEEDS: -No education needs identified at this time   Intake/Output Summary (Last 24 hours) at 05/27/13 1453 Last data filed at 05/27/13 0830  Gross per 24 hour  Intake    920 ml  Output    250 ml  Net    670 ml    Labs:   Recent Labs Lab 05/26/13 0718 05/27/13 0655  NA 140 140  K 3.6* 3.9  CL 100 104  CO2  --  26  BUN 15 11  CREATININE 1.00  0.64  CALCIUM  --  8.8  GLUCOSE 124* 85    Scheduled Meds: . ciprofloxacin  500 mg Oral Daily  . collagenase   Topical Daily  . multivitamin with minerals  1 tablet Oral q morning - 10a  . omega-3 acid ethyl esters  1 g Oral Daily  . pantoprazole  40 mg Oral Daily  . polyethylene glycol  17 g Oral Daily  . potassium chloride  10 mEq Oral q morning - 10a    Continuous Infusions: . 0.9 % NaCl with KCl 20 mEq / L      Past Medical History  Diagnosis Date  . Arthritis   . Coronary artery disease   . GERD (gastroesophageal reflux disease)      Past Surgical History  Procedure Laterality Date  . Hip fracture surgery      x 3  . Orif femur fracture Left 12/13/2012    Procedure: OPEN REDUCTION INTERNAL FIXATION (ORIF) DISTAL FEMUR FRACTURE;  Surgeon: Mauri Pole, MD;  Location: [facility];  Service: Orthopedics;  Laterality: Left;  . Appendectomy    . Cholecystectomy      Arthur Holms, RD, LDN Pager #: [phone] After-Hours Pager #: [phone]

## 2013-05-27 NOTE — ED Provider Notes (Signed)
Medical screening examination/treatment/procedure(s) were conducted as a shared visit with non-physician practitioner(s) and myself.  I personally evaluated the patient during the encounter.   EKG Interpretation   Date/Time:  Wednesday May 26 2013 07:08:59 EDT Ventricular Rate:  77 PR Interval:  169 QRS Duration: 126 QT Interval:  417 QTC Calculation: 472 R Axis:   -71 Text Interpretation:  Sinus rhythm Consider right atrial enlargement IVCD,  consider atypical RBBB Left ventricular hypertrophy Anterior infarct, old  No significant change was found Confirmed by [facility]  MD, TREY ([id number]) on  05/26/2013 7:16:05 AM        Nicole Siren III, MD 05/27/13 [phone]

## 2013-05-27 NOTE — Progress Notes (Signed)
UR completed 

## 2013-05-27 NOTE — Consult Note (Addendum)
WOC wound consult note Reason for Consult:Consult requested for sacrum and right heel wounds.  Husband at bedside states she has had the sacral wound at least a week and the right heel wound for several weeks. He believes both were the result of falls. Wound type: Sacrum with deep tissue injury; 7X5cm area, 50% dark purple with skin intact over wound, 50% with skin which has already peeled, revealing dark red moist unstageable wound bed.  Wound is high risk to evolve into full thickness tissue loss.  Husband at bedside and he visually assessed the wound and discussed plan of care.  Pt does not appear to understand the possible severity of this wound or the topical treatments discussed, but husband verbalizes understanding .  Pt is frequently incontinent and emaciated and has other systemic factors which can impair healing. Pressure Ulcer POA: Yes to sacrum and left heel.  Measurement: Left heel stage 3 healing wound; 2X1.5X.2cm Wound bed:90% pink and moist, 10% yellow to wound edges Drainage (amount, consistency, odor) small amt yellow drainage, no odor Periwound: intact skin surrounding Dressing procedure/placement/frequency:Santyl ointment to chemically debride nonviable tissue to sacrum.  Optimize nutrition; dietician consult ordered.  Air mattress to reduce pressure to sacrum. Float left heel to reduce pressure.  Foam dressing to protect and promote healing. Please re-consult if further assistance is needed.  Thank-you,  Julien Girt MSN, [place], [place], [place], [place]

## 2013-05-27 NOTE — Progress Notes (Signed)
[facility] PROGRESS NOTE  [name]:[id number] DOB: 20-Aug-1915 DOA: 05/26/2013 PCP: Tommy Medal, MD  Assessment/Plan:  Principal Problem:   Abdominal pain, left: could be radiculopathy from T11 fracture.  MRCP shows no definite mass or obstructing stone.  Start diet. Discussed with Ms. Glyn Ade Active Problems:   Vertebral compression fracture, T11, likely acute. Pt does not want MRI or consideration for vertebroplasty.  Pain control. PT, OT   Esophageal reflux   Blood in stool   Constipation   Ulcer of left heel   Decubitus ulcer limited to breakdown of skin (stage 2)   Nonspecific (abnormal) findings on radiological and other examination of biliary tract: appreciate GI    Physical deconditioning: PT, OT pending.   Dehydration: continue IVF UTI? Mild. Many squamous epithelial cells, likely contaminant. cx pending. Continue cipro for now  Code Status:  DNR Family Communication:  Son at bedside Disposition Plan:  ALF v. SNF  HPI/Subjective: C/o left flank, abdominal pain. No N/V.  Objective: Filed Vitals:   05/27/13 1300  BP: 134/49  Pulse: 85  Temp: 98.2 F (36.8 C)  Resp: 18    Intake/Output Summary (Last 24 hours) at 05/27/13 1430 Last data filed at 05/27/13 0830  Gross per 24 hour  Intake    920 ml  Output    250 ml  Net    670 ml   Filed Weights   05/26/13 1500  Weight: 58.06 kg (128 lb)    Exam:   General:  Alert, oriented  HEENT: dry MM  Cardiovascular: RRR without MGR  Respiratory: CTA without WRR  Abdomen: S, NT, ND  Ext: bilateral edema  Basic Metabolic Panel:  Recent Labs Lab 05/26/13 0718 05/27/13 0655  NA 140 140  K 3.6* 3.9  CL 100 104  CO2  --  26  GLUCOSE 124* 85  BUN 15 11  CREATININE 1.00 0.64  CALCIUM  --  8.8   Liver Function Tests:  Recent Labs Lab 05/26/13 0708  AST 27  ALT 13  ALKPHOS 144*  BILITOT 0.5  PROT 6.5  ALBUMIN 2.5*    Recent Labs Lab 05/26/13 0708  LIPASE 24   No  results found for this basename: AMMONIA,  in the last 168 hours CBC:  Recent Labs Lab 05/26/13 0708 05/26/13 0718 05/27/13 0655  WBC 7.3  --  8.5  NEUTROABS 4.4  --   --   HGB 12.0 12.6 11.3*  HCT 36.6 37.0 35.9*  MCV 99.2  --  102.0*  PLT 245  --  205   Cardiac Enzymes: No results found for this basename: CKTOTAL, CKMB, CKMBINDEX, TROPONINI,  in the last 168 hours BNP (last 3 results) No results found for this basename: PROBNP,  in the last 8760 hours CBG: No results found for this basename: GLUCAP,  in the last 168 hours  Recent Results (from the past 240 hour(s))  URINE CULTURE     Status: None   Collection Time    05/26/13 10:39 AM      Result Value Ref Range Status   Specimen Description URINE, CATHETERIZED   Final   Special Requests NONE   Final   Culture  Setup Time     Final   Value: 05/26/2013 15:12     Performed at [place]     Final   Value: 90,000 COLONIES/ML     Performed at [place]     Final  Value: GRAM POSITIVE COCCI     Performed at [facility]   Report Status PENDING   Incomplete     Studies: US Abdomen Complete  05/26/2013   CLINICAL DATA Rule out biliary obstruction.  Abdominal pain  EXAM ULTRASOUND ABDOMEN COMPLETE  COMPARISON CT abdomen from today.  MRCP 09/13/2009  FINDINGS Gallbladder:  Surgically absent gallbladder  Common bile duct:  Diameter: Biliary ductal dilatation. Intrahepatic bile ducts are dilated. Common bile duct measures up to 14 mm. No ductal stone identified. No obstructing mass identified however further anatomic evaluation with MRCP is suggested as there was a small cystic lesion in the head of the pancreas on the prior MRCP.  Liver:  No focal lesion identified. Within normal limits in parenchymal echogenicity.  IVC:  No abnormality visualized.  Pancreas:  Visualized portion unremarkable.  Spleen:  Size and appearance within normal limits.  Right Kidney:  Length: 10.5 cm.  Echogenicity within normal limits. No mass or hydronephrosis visualized.  Left Kidney:  Length: 9.5 cm. Echogenicity within normal limits. No mass or hydronephrosis visualized.  Abdominal aorta:  No aneurysm visualized.  Other findings:  Small left pleural effusion  IMPRESSION Biliary ductal dilatation with the common bile duct measuring up to 14 mm. This could be due to a stone or stricture or mass. Follow-up MRCP suggested. There was a cystic lesion in the head of the pancreas on the MRCP of 09/13/2009.  SIGNATURE  Electronically Signed   By: Franchot Gallo M.D.   On: 05/26/2013 10:14   Ct Abdomen Pelvis W Contrast  05/26/2013   CLINICAL DATA Abdominal pain  EXAM CT ABDOMEN AND PELVIS WITH CONTRAST  TECHNIQUE Multidetector CT imaging of the abdomen and pelvis was performed using the standard protocol following bolus administration of intravenous contrast.  CONTRAST 23mL OMNIPAQUE IOHEXOL 300 MG/ML  SOLN  COMPARISON CT 12/17/2007  FINDINGS Small left pleural effusion and left lower lobe atelectasis. Right lung base is clear.  Heart size is normal.  Mild coronary artery calcification.  Bile ducts are dilated. Intrahepatic and extrahepatic bile ducts are dilated. Common hepatic duct measures 12 mm. Common bile duct measures 10 mm. This has increased significantly since the prior study and may reflect a common duct stone or stricture. No definite neoplasm. There is evidence of chronic calcific pancreatitis, unchanged. No evidence of acute pancreatitis. The spleen is normal.  The kidneys show no obstruction or mass.  Negative for bowel obstruction. Sigmoid diverticulosis. No free fluid in the pelvis. No mass or adenopathy.  IMPRESSION Small left pleural effusion and left lower lobe atelectasis  Biliary ductal dilatation. This may be due to a stone or stricture. There is evidence of chronic calcific pancreatitis but no evidence of acute appendicitis or mass lesion is identified.  SIGNATURE  Electronically Signed    By: Franchot Gallo M.D.   On: 05/26/2013 08:43   Mr Abd W/wo Cm/mrcp  05/27/2013   CLINICAL DATA:  Abdominal and flank pain. Dilated biliary tree on CT and ultrasound.  EXAM: MRI ABDOMEN WITHOUT AND WITH CONTRAST (INCLUDING MRCP)  TECHNIQUE: Multiplanar multisequence MR imaging of the abdomen was performed both before and after the administration of intravenous contrast. Heavily T2-weighted images of the biliary and pancreatic ducts were obtained, and three-dimensional MRCP images were rendered by post processing.  CONTRAST:  46mL MULTIHANCE GADOBENATE DIMEGLUMINE 529 MG/ML IV SOLN  COMPARISON:  US ABDOMEN COMPLETE dated 05/26/2013; CT ABD/PELVIS W CM dated 05/26/2013; MR MRCP dated 09/13/2009  FINDINGS: Portions of exam  are mildly motion degraded.  Small left greater than right bilateral pleural effusions. Left base airspace disease which is favored to represent atelectasis.  Compression deformities at T12, T10, T9, in T7. T11 compression deformity is likely subacute. Edema and fluid identified within the inferior aspect of the vertebral body on image 26/series 8. Abnormal enhancement in this area, including on image 12/series 16.  Normal liver, with development of intrahepatic ductal dilatation since 09/13/2009. Normal spleen, stomach, adrenal glands. Normal kidneys for age.  Pancreatic atrophy. A 5 mm cystic lesion in the pancreatic tail is similar to the 09/13/2009 MR and likely a small pseudocyst or area of branch duct ectasia. Other foci of mild branch duct ectasia throughout the pancreatic parenchyma are again identified.  A simple appearing cystic lesion arises from the pancreatic duct within the uncinate process. This measures maximally 1.9 cm on transverse image 25/series 4 versus 2.5 cm on 09/13/2009. No enhancement in this area.  The common duct measures maximally 1.3 cm in the porta hepatis on image 44/series 6. Increased from 1.0 cm at the same level on 09/13/2009. Followed to the level of the ampulla,  without evidence of obstructive stone or mass.  Normal caliber of abdominal bowel loops.  Incompletely imaged bladder distension.  IMPRESSION: 1. Since 09/13/2009, new and progressive biliary ductal dilatation, without evidence of obstructive stone or mass. Although this could be within normal variation in this age group, periampullary stricture (or otherwise occult ampullary lesion) cannot be excluded. 2. Findings of chronic pancreatitis with decreased size of a pseudocyst within the uncinate process. 3. Thoracic compression deformities. A T11 compression deformity is likely acute. Focal edema/fluid within the vertebral body and surrounding hyper enhancement are likely secondary. Especially if there is a clinical concern of infection, which cannot be excluded, dedicated pre and post contrast thoracic spine MRI should be considered. 4. Small bilateral pleural effusions with left base airspace disease. Favor atelectasis. 5. Mild motion degradation.   Electronically Signed   By: Abigail Miyamoto M.D.   On: 05/27/2013 13:45    Scheduled Meds: . ciprofloxacin  500 mg Oral Daily  . collagenase   Topical Daily  . multivitamin with minerals  1 tablet Oral q morning - 10a  . omega-3 acid ethyl esters  1 g Oral Daily  . pantoprazole  40 mg Oral Daily  . polyethylene glycol  17 g Oral Daily  . potassium chloride  10 mEq Oral q morning - 10a   Continuous Infusions: . 0.9 % NaCl with KCl 20 mEq / L      Time spent: 35 minutes  [facility] Pager [phone]. If 7PM-7AM, please contact night-coverage at [email or web address], password [facility] 05/27/2013, 2:30 PM  LOS: 1 day

## 2013-05-27 NOTE — Progress Notes (Addendum)
        Daily Rounding Note  05/27/2013, 1:56 PM  LOS: 1 day   SUBJECTIVE:       Pain is anywhere from 3 to 6, worse if she is moved or shifts position.  Morphine not helping. On left side and in back and abdomen.  No nausea. Has had at least 3 falls at home since Jan 2014 thru 3 weeks.  The pain has been persistent for several weeks.  Sounds like bone pain.   OBJECTIVE:         Vital signs in last 24 hours:    Temp:  [97.7 F (36.5 C)-98.4 F (36.9 C)] 98.2 F (36.8 C) (03/12 1300) Pulse Rate:  [75-85] 85 (03/12 1300) Resp:  [18-20] 18 (03/12 1300) BP: (114-134)/(48-59) 134/49 mmHg (03/12 1300) SpO2:  [92 %-100 %] 92 % (03/12 1300) Weight:  [58.06 kg (128 lb)] 58.06 kg (128 lb) (03/11 1500)   General: frail but not ill looking.  Comfortable.  Heart: rrr Chest: clear in front.  No cough or lab Abdomen: soft, ND, hypoactive BS.    Extremities: no cce,  Wounds on heals Neuro/Psych:  Oriented to place, year, situation.   Intake/Output from previous day: 03/11 0701 - 03/12 0700 In: 920 [P.O.:320; I.V.:600] Out: -   Intake/Output this shift: Total I/O In: 0  Out: 250 [Urine:250]  Lab Results:  Recent Labs  05/26/13 0708 05/26/13 0718 05/27/13 0655  WBC 7.3  --  8.5  HGB 12.0 12.6 11.3*  HCT 36.6 37.0 35.9*  PLT 245  --  205   BMET  Recent Labs  05/26/13 0718 05/27/13 0655  NA 140 140  K 3.6* 3.9  CL 100 104  CO2  --  26  GLUCOSE 124* 85  BUN 15 11  CREATININE 1.00 0.64  CALCIUM  --  8.8   LFT  Recent Labs  05/26/13 0708  PROT 6.5  ALBUMIN 2.5*  AST 27  ALT 13  ALKPHOS 144*  BILITOT 0.5  BILIDIR <0.2  IBILI NOT CALCULATED   PT/INR No results found for this basename: LABPROT, INR,  in the last 72 hours Hepatitis Panel No results found for this basename: HEPBSAG, HCVAB, HEPAIGM, HEPBIGM,  in the last 72 hours  Studies/Results: Us Abdomen Complete  05/26/2013   CLINICAL DATA Rule out  biliary obstruction.  Abdominal pain  EXAM ULTRASOUND ABDOMEN COMPLETE  COMPARISON CT abdomen from today.  MRCP 09/13/2009  FINDINGS Gallbladder:  Surgically absent gallbladder  Common bile duct:  Diameter: Biliary ductal dilatation. Intrahepatic bile ducts are dilated. Common bile duct measures up to 14 mm. No ductal stone identified. No obstructing mass identified however further anatomic evaluation with MRCP is suggested as there was a small cystic lesion in the head of the pancreas on the prior MRCP.  Liver:  No focal lesion identified. Within normal limits in parenchymal echogenicity.  IVC:  No abnormality visualized.  Pancreas:  Visualized portion unremarkable.  Spleen:  Size and appearance within normal limits.  Right Kidney:  Length: 10.5 cm. Echogenicity within normal limits. No mass or hydronephrosis visualized.  Left Kidney:  Length: 9.5 cm. Echogenicity within normal limits. No mass or hydronephrosis visualized.  Abdominal aorta:  No aneurysm visualized.  Other findings:  Small left pleural effusion  IMPRESSION Biliary ductal dilatation with the common bile duct measuring up to 14 mm. This could be due to a stone or stricture or mass. Follow-up MRCP suggested. There was a cystic lesion in the   head of the pancreas on the MRCP of 09/13/2009.  SIGNATURE  Electronically Signed   By: Charles  Clark M.D.   On: 05/26/2013 10:14   Ct Abdomen Pelvis W Contrast  05/26/2013   CLINICAL DATA Abdominal pain  EXAM CT ABDOMEN AND PELVIS WITH CONTRAST  TECHNIQUE Multidetector CT imaging of the abdomen and pelvis was performed using the standard protocol following bolus administration of intravenous contrast.  CONTRAST 70mL OMNIPAQUE IOHEXOL 300 MG/ML  SOLN  COMPARISON CT 12/17/2007  FINDINGS Small left pleural effusion and left lower lobe atelectasis. Right lung base is clear.  Heart size is normal.  Mild coronary artery calcification.  Bile ducts are dilated. Intrahepatic and extrahepatic bile ducts are dilated.  Common hepatic duct measures 12 mm. Common bile duct measures 10 mm. This has increased significantly since the prior study and may reflect a common duct stone or stricture. No definite neoplasm. There is evidence of chronic calcific pancreatitis, unchanged. No evidence of acute pancreatitis. The spleen is normal.  The kidneys show no obstruction or mass.  Negative for bowel obstruction. Sigmoid diverticulosis. No free fluid in the pelvis. No mass or adenopathy.  IMPRESSION Small left pleural effusion and left lower lobe atelectasis  Biliary ductal dilatation. This may be due to a stone or stricture. There is evidence of chronic calcific pancreatitis but no evidence of acute appendicitis or mass lesion is identified.  SIGNATURE  Electronically Signed   By: Charles  Clark M.D.   On: 05/26/2013 08:43   Mr Abd W/wo Cm/mrcp  05/27/2013   CLINICAL DATA:  Abdominal and flank pain. Dilated biliary tree on CT and ultrasound.  EXAM: MRI ABDOMEN WITHOUT AND WITH CONTRAST (INCLUDING MRCP)  TECHNIQUE: Multiplanar multisequence MR imaging of the abdomen was performed both before and after the administration of intravenous contrast. Heavily T2-weighted images of the biliary and pancreatic ducts were obtained, and three-dimensional MRCP images were rendered by post processing.  CONTRAST:  9mL MULTIHANCE GADOBENATE DIMEGLUMINE 529 MG/ML IV SOLN  COMPARISON:  US ABDOMEN COMPLETE dated 05/26/2013; CT ABD/PELVIS W CM dated 05/26/2013; MR MRCP dated 09/13/2009  FINDINGS: Portions of exam are mildly motion degraded.  Small left greater than right bilateral pleural effusions. Left base airspace disease which is favored to represent atelectasis.  Compression deformities at T12, T10, T9, in T7. T11 compression deformity is likely subacute. Edema and fluid identified within the inferior aspect of the vertebral body on image 26/series 8. Abnormal enhancement in this area, including on image 12/series 16.  Normal liver, with development of  intrahepatic ductal dilatation since 09/13/2009. Normal spleen, stomach, adrenal glands. Normal kidneys for age.  Pancreatic atrophy. A 5 mm cystic lesion in the pancreatic tail is similar to the 09/13/2009 MR and likely a small pseudocyst or area of branch duct ectasia. Other foci of mild branch duct ectasia throughout the pancreatic parenchyma are again identified.  A simple appearing cystic lesion arises from the pancreatic duct within the uncinate process. This measures maximally 1.9 cm on transverse image 25/series 4 versus 2.5 cm on 09/13/2009. No enhancement in this area.  The common duct measures maximally 1.3 cm in the porta hepatis on image 44/series 6. Increased from 1.0 cm at the same level on 09/13/2009. Followed to the level of the ampulla, without evidence of obstructive stone or mass.  Normal caliber of abdominal bowel loops.  Incompletely imaged bladder distension.  IMPRESSION: 1. Since 09/13/2009, new and progressive biliary ductal dilatation, without evidence of obstructive stone or mass. Although this could   be within normal variation in this age group, periampullary stricture (or otherwise occult ampullary lesion) cannot be excluded. 2. Findings of chronic pancreatitis with decreased size of a pseudocyst within the uncinate process. 3. Thoracic compression deformities. A T11 compression deformity is likely acute. Focal edema/fluid within the vertebral body and surrounding hyper enhancement are likely secondary. Especially if there is a clinical concern of infection, which cannot be excluded, dedicated pre and post contrast thoracic spine MRI should be considered. 4. Small bilateral pleural effusions with left base airspace disease. Favor atelectasis. 5. Mild motion degradation.   Electronically Signed   By: Kyle  Talbot M.D.   On: 05/27/2013 13:45    ASSESMENT:   * Left abd pain, elevated alk phos and dilated bile ducts. No mass seen causing obstruction on CT scan or on MR.  MRCP with  chronic pancreatitis, decreased pseudocyst in uncinate, progresive  Biliary ductal dilatation, ? peri-ampullary stricture vs noral variant for age?.   *  T 11 compression fracture.  Spoke with Dr Scott in radiology who looked at MR and there is a T11 compression fracture which is acute and edema is present.  Other older compression fractures have healed.   Her pain is most c/w this fracture and likely causing the elevated alk phos.    * S/p ORIF left hip fracture 11/2012.  Released from [facility] ~ Mar 24 2012.  The elevted alk phos might be sequela of hip fracture   * Sacral skin breakdown. Left foot wound. Started on Cipro by Dr Pang yesterday.   * Hx GERD. Uses Prilosec at home.   * partially treated UTI, completing the course of cipro.      PLAN   *  CA 19-9 in process. Do not see that gamma ggt ordered, I placed the order.  Not clear we need any further GI studies in this elderly pt who likely has spinal source for pain.  *  Will feed pt.  Soft diet ordered.  *  Trial of other pain meds, as Morphine is not helping.   *  If son Ted Montellano is not in room, can Dr Brodie call him at [phone]?    Sarah Gribbin  05/27/2013, 1:56 PM Pager: [phone] Attending MD note:   I have taken a history,and reviewed the chart. I agree with the Advanced Practitioner's impression and recommendations. Abdominal pain is actually a back pain ,likely from ? accute T11  thoracic compression . Will await ortho consult.Pancreatic pseudocysta are stable. Progressive non obstructive CBD dilation.  Dora Brodie,MD [facility] Pager # [phone] Addendum: GGT is normal, while alk phosphatase is elevated. Suspect bone origin of elevated alk. Ph supports acute T11 compression fx.  Will sign off . 

## 2013-05-27 NOTE — Clinical Social Work Psychosocial (Signed)
Clinical Social Work Department BRIEF PSYCHOSOCIAL ASSESSMENT 05/27/2013  Patient:  [name], [name]     Account Number:  [id number]     Admit date:  05/26/2013  Clinical Social Worker:  Jennette Dubin  Date/Time:  05/26/2013 09:00 AM  Referred by:  Physician  Date Referred:  05/26/2013 Referred for  SNF Placement   Other Referral:  N/A Interview type:  Patient Other interview type:   Family    PSYCHOSOCIAL DATA Living Status:  FACILITY Admitted from facility:  [facility] Level of care:  Assisted Living Primary support name:  Maegan Buller [phone] Primary support relationship to patient:  CHILD, ADULT Degree of support available:   Good support    CURRENT CONCERNS  Other Concerns:  N/A  SOCIAL WORK ASSESSMENT / PLAN CSW consult to pt/family regarding placement concerns. Pt is currently residing at [facility] and has had numerous falls. Pt/family and [facility] staff feels pt requires a higher level of care. Pt has [place] services. Pt/family is requesitng assistance with placement. Pt has Medicare Part A&B and AARP. CSW explained to family Medicare guidelines surrounding SNF placement. Pt/family voiced understanding. It has been determined by medical staff that pt will be admitted.   Assessment/plan status:  Psychosocial Support/Ongoing Assessment of Needs Other assessment/ plan:   N/A   Information/referral to community resources:   Family given a list of SNF's.    PATIENT'S/FAMILY'S RESPONSE TO PLAN OF CARE: Pt/family understood and appreciated the role CSW.

## 2013-05-28 DIAGNOSIS — E43 Unspecified severe protein-calorie malnutrition: Secondary | ICD-10-CM

## 2013-05-28 MED ORDER — ENSURE COMPLETE PO LIQD
237.0000 mL | Freq: Three times a day (TID) | ORAL | Status: DC
  Administered 2013-05-28 – 2013-05-29 (×3): 237 mL via ORAL

## 2013-05-28 MED ORDER — MORPHINE SULFATE 2 MG/ML IJ SOLN
2.0000 mg | INTRAMUSCULAR | Status: DC | PRN
  Administered 2013-05-28: 2 mg via INTRAVENOUS
  Filled 2013-05-28: qty 1

## 2013-05-28 MED ORDER — MORPHINE SULFATE 2 MG/ML IJ SOLN: 2.0000 mg | INTRAMUSCULAR | Status: DC | PRN

## 2013-05-28 MED ORDER — OXYCODONE-ACETAMINOPHEN 5-325 MG PO TABS
1.0000 | ORAL_TABLET | ORAL | Status: DC | PRN
  Administered 2013-05-28 – 2013-05-29 (×4): 1 via ORAL
  Filled 2013-05-28 (×4): qty 1

## 2013-05-28 NOTE — Progress Notes (Signed)
OT Cancellation Note  Patient Details Name: [name] MRN: [id number] DOB: [birth year]   Cancelled Treatment:    Reason Eval/Treat Not Completed: OT screened, no needs identified, will sign off - noted pt for discharge to ALF tomorrow.  Will defer OT eval to ALF.   Lucille Passy M [phone]  05/28/2013, 4:53 PM

## 2013-05-28 NOTE — Evaluation (Addendum)
Physical Therapy Evaluation Patient Details Name: [name] MRN: [id number] DOB: 07-21-1915 Today's Date: 05/28/2013 Time: 6073-7106 PT Time Calculation (min): 20 min  PT Assessment / Plan / Recommendation History of Present Illness  Abdominal pain, left: could be radiculopathy from T11 fracture.  MRCP shows no definite mass or obstructing stone. blood in stool. Hip fx 9/14  Clinical Impression  Pt pleasantly confused stating that she is at [facility] January. Pt reports back pain but unable to rate at this time and educated for back precautions but unable to state any end of session. Pt with below deficits and will benefit from acute therapy to maximize mobility, function and decrease burden of care prior to DC to SNF to continue therapy due to decreased caregiver assist. Recommend OOB daily with nursing assist and NT present for transfer and aware of sequence. Pt 92% on 2L and maintained throughout.     PT Assessment  Patient needs continued PT services    Follow Up Recommendations  SNF;Supervision/Assistance - 24 hour    Does the patient have the potential to tolerate intense rehabilitation      Barriers to Discharge Decreased caregiver support      Equipment Recommendations  None recommended by PT    Recommendations for Other Services OT consult   Frequency Min 3X/week    Precautions / Restrictions Precautions Precautions: Fall;Back Precaution Comments: t11 comp fx and back precautions to help limit pain Restrictions Weight Bearing Restrictions: No   Pertinent Vitals/Pain Back pain unrated, stated relieved end of session with positioning in chair      Mobility  Bed Mobility Overal bed mobility: Needs Assistance Bed Mobility: Rolling;Sidelying to Sit Rolling: Mod assist Sidelying to sit: Mod assist General bed mobility comments: cues for sequence, safety, use of rail and to adhere to back precautions to decrease pain Transfers Overall transfer level: Needs  assistance Equipment used: Rolling walker (2 wheeled) Transfers: Sit to/from [facility] Sit to Stand: Mod assist Stand pivot transfers: Mod assist General transfer comment: cueing for hand placement with assist for anteior translation, weight shift and balance . sit <>stand x 2 due to initial fatigue with standing.  max cues for pivotal steps with RW to chair.     Exercises     PT Diagnosis: Difficulty walking;Generalized weakness;Acute pain  PT Problem List: Decreased strength;Decreased cognition;Decreased knowledge of use of DME;Decreased activity tolerance;Decreased balance;Pain;Decreased knowledge of precautions;Decreased mobility;Cardiopulmonary status limiting activity;Decreased skin integrity PT Treatment Interventions: Gait training;DME instruction;Balance training;Cognitive remediation;Functional mobility training;Patient/family education;Therapeutic activities;Therapeutic exercise     PT Goals(Current goals can be found in the care plan section) Acute Rehab PT Goals Patient Stated Goal: be able to go to rehab PT Goal Formulation: With patient Time For Goal Achievement: 06/11/13 Potential to Achieve Goals: Fair  Visit Information  Last PT Received On: 05/28/13 Assistance Needed: +2 (for gait) History of Present Illness: Abdominal pain, left: could be radiculopathy from T11 fracture.  MRCP shows no definite mass or obstructing stone. blood in stool. Hip fx 9/14       Prior [place] expects to be discharged to:: Other (Comment) (retirement center) Prior Function Level of Independence: Needs assistance Gait / Transfers Assistance Needed: pt reports she was getting OOB and walking to bathroom on her own PTA ADL's / Homemaking Assistance Needed: pt states she was receiving assist for bathing and dressing Communication Communication: HOH    Cognition  Cognition Arousal/Alertness: Awake/alert Behavior During Therapy: WFL for  tasks assessed/performed Overall  Cognitive Status: Impaired/Different from baseline Area of Impairment: Orientation;Memory;Safety/judgement;Problem solving Orientation Level: Time;Situation;Place Memory: Decreased short-term memory Safety/Judgement: Decreased awareness of deficits;Decreased awareness of safety Problem Solving: Slow processing;Requires verbal cues;Requires tactile cues;Decreased initiation    Extremity/Trunk Assessment Upper Extremity Assessment Upper Extremity Assessment: Generalized weakness Lower Extremity Assessment Lower Extremity Assessment: Generalized weakness Cervical / Trunk Assessment Cervical / Trunk Assessment: Kyphotic   Balance Balance Overall balance assessment: Needs assistance Sitting balance-Leahy Scale: Poor Sitting balance - Comments: pt with air mattress with lip unable to be deflated fully and pushing pt into posterior lean so difficult to fully assess try sitting balance  End of Session PT - End of Session Equipment Utilized During Treatment: Gait belt Activity Tolerance: Patient limited by fatigue;Patient limited by pain Patient left: in chair;with call bell/phone within reach Nurse Communication: Mobility status  GP Functional Assessment Tool Used: clinical judgement Functional Limitation: Mobility: Walking and moving around Mobility: Walking and Moving Around Current Status ([id number]): At least 60 percent but less than 80 percent impaired, limited or restricted Mobility: Walking and Moving Around Goal Status [phone]): At least 20 percent but less than 40 percent impaired, limited or restricted   Melford Aase 05/28/2013, 12:56 PM Elwyn Reach, [place]

## 2013-05-28 NOTE — Progress Notes (Addendum)
[facility] PROGRESS NOTE  [name] [id number] DOB: [birth year] DOA: 05/26/2013 PCP: Tommy Medal, MD  Assessment/Plan:  Principal Problem:   Abdominal pain, left: could be radiculopathy from T11 fracture.  MRCP shows no definite mass or obstructing stone.  Not eating much. Monitor for another day.  Per family, has not been eating much "for a long time".  Discussed the possibility of hospice/comfort care. Son thinks pt is "not yet ready for hospice" Active Problems:   Vertebral compression fracture, T11, likely acute. Pt does not want MRI or consideration for vertebroplasty.  Pain not well controlled. Eventually to [facility]F with PT/OT   Esophageal reflux   Blood in stool   Constipation   Ulcer of left heel   Decubitus ulcer limited to breakdown of skin (stage 2)   Nonspecific (abnormal) findings on radiological and other examination of biliary tract: appreciate GI   Physical deconditioning: PT, OT pending.   Dehydration: continue IVF Abnormal UA likely contaminated. 90 K colonies. Denies dysuria. Will d/c cipro as may be contributing to poor oral intake. Protein calorie malnutrition, severe.  Code Status:  DNR Family Communication:  Son at bedside Disposition Plan:  ALF per their request:  PATIENT DOES NOT HAVE TB  HPI/Subjective: C/o back and epigastric pain. No appetite. Doesn't feel well.  Objective: Filed Vitals:   05/28/13 0527  BP: 122/55  Pulse: 89  Temp: 98 F (36.7 C)  Resp: 15    Intake/Output Summary (Last 24 hours) at 05/28/13 1112 Last data filed at 05/28/13 0900  Gross per 24 hour  Intake   2672 ml  Output   1000 ml  Net   1672 ml   Filed Weights   05/26/13 1500  Weight: 58.06 kg (128 lb)    Exam:   General:  Alert, oriented in chair  HEENT: MM less dry  Cardiovascular: RRR without MGR  Respiratory: CTA without WRR  Abdomen: S, NT, ND  Ext: bilateral edema  Basic Metabolic Panel:  Recent Labs Lab  05/26/13 0718 05/27/13 0655  NA 140 140  K 3.6* 3.9  CL 100 104  CO2  --  26  GLUCOSE 124* 85  BUN 15 11  CREATININE 1.00 0.64  CALCIUM  --  8.8   Liver Function Tests:  Recent Labs Lab 05/26/13 0708  AST 27  ALT 13  ALKPHOS 144*  BILITOT 0.5  PROT 6.5  ALBUMIN 2.5*    Recent Labs Lab 05/26/13 0708  LIPASE 24   No results found for this basename: AMMONIA,  in the last 168 hours CBC:  Recent Labs Lab 05/26/13 0708 05/26/13 0718 05/27/13 0655  WBC 7.3  --  8.5  NEUTROABS 4.4  --   --   HGB 12.0 12.6 11.3*  HCT 36.6 37.0 35.9*  MCV 99.2  --  102.0*  PLT 245  --  205   Cardiac Enzymes: No results found for this basename: CKTOTAL, CKMB, CKMBINDEX, TROPONINI,  in the last 168 hours BNP (last 3 results) No results found for this basename: PROBNP,  in the last 8760 hours CBG: No results found for this basename: GLUCAP,  in the last 168 hours  Recent Results (from the past 240 hour(s))  URINE CULTURE     Status: None   Collection Time    05/26/13 10:39 AM      Result Value Ref Range Status   Specimen Description URINE, CATHETERIZED   Final   Special Requests NONE   Final   Culture  Setup Time     Final   Value: 05/26/2013 15:12     Performed at [facility]     Final   Value: 90,000 COLONIES/ML     Performed at [facility]   Culture     Final   Value: [place]     Performed at [facility]   Report Status PENDING   Incomplete     Studies: Dg Chest Port 1 View  05/27/2013   CLINICAL DATA:  Chest pain.  EXAM: PORTABLE CHEST - 1 VIEW  COMPARISON:  12/15/2012.  FINDINGS: The heart is enlarged but stable. There is tortuosity, ectasia and calcification of the thoracic aorta. There are chronic lung changes and elevation of the left hemidiaphragm with overlying vascular crowding and atelectasis. Remote healed rib fractures are noted. No definite acute pulmonary findings.  IMPRESSION: Cardiac enlargement and  chronic lung changes. No definite acute overlying pulmonary process.   Electronically Signed   By: Kalman Jewels M.D.   On: 05/27/2013 17:51   Mr Abd W/wo Cm/mrcp  05/27/2013   CLINICAL DATA:  Abdominal and flank pain. Dilated biliary tree on CT and ultrasound.  EXAM: MRI ABDOMEN WITHOUT AND WITH CONTRAST (INCLUDING MRCP)  TECHNIQUE: Multiplanar multisequence MR imaging of the abdomen was performed both before and after the administration of intravenous contrast. Heavily T2-weighted images of the biliary and pancreatic ducts were obtained, and three-dimensional MRCP images were rendered by post processing.  CONTRAST:  60mL MULTIHANCE GADOBENATE DIMEGLUMINE 529 MG/ML IV SOLN  COMPARISON:  US ABDOMEN COMPLETE dated 05/26/2013; CT ABD/PELVIS W CM dated 05/26/2013; MR MRCP dated 09/13/2009  FINDINGS: Portions of exam are mildly motion degraded.  Small left greater than right bilateral pleural effusions. Left base airspace disease which is favored to represent atelectasis.  Compression deformities at T12, T10, T9, in T7. T11 compression deformity is likely subacute. Edema and fluid identified within the inferior aspect of the vertebral body on image 26/series 8. Abnormal enhancement in this area, including on image 12/series 16.  Normal liver, with development of intrahepatic ductal dilatation since 09/13/2009. Normal spleen, stomach, adrenal glands. Normal kidneys for age.  Pancreatic atrophy. A 5 mm cystic lesion in the pancreatic tail is similar to the 09/13/2009 MR and likely a small pseudocyst or area of branch duct ectasia. Other foci of mild branch duct ectasia throughout the pancreatic parenchyma are again identified.  A simple appearing cystic lesion arises from the pancreatic duct within the uncinate process. This measures maximally 1.9 cm on transverse image 25/series 4 versus 2.5 cm on 09/13/2009. No enhancement in this area.  The common duct measures maximally 1.3 cm in the porta hepatis on image 44/series  6. Increased from 1.0 cm at the same level on 09/13/2009. Followed to the level of the ampulla, without evidence of obstructive stone or mass.  Normal caliber of abdominal bowel loops.  Incompletely imaged bladder distension.  IMPRESSION: 1. Since 09/13/2009, new and progressive biliary ductal dilatation, without evidence of obstructive stone or mass. Although this could be within normal variation in this age group, periampullary stricture (or otherwise occult ampullary lesion) cannot be excluded. 2. Findings of chronic pancreatitis with decreased size of a pseudocyst within the uncinate process. 3. Thoracic compression deformities. A T11 compression deformity is likely acute. Focal edema/fluid within the vertebral body and surrounding hyper enhancement are likely secondary. Especially if there is a clinical concern of infection, which cannot be excluded, dedicated pre and post  contrast thoracic spine MRI should be considered. 4. Small bilateral pleural effusions with left base airspace disease. Favor atelectasis. 5. Mild motion degradation.   Electronically Signed   By: Abigail Miyamoto M.D.   On: 05/27/2013 13:45    Scheduled Meds: . collagenase   Topical Daily  . feeding supplement (ENSURE COMPLETE)  237 mL Oral BID BM  . multivitamin with minerals  1 tablet Oral q morning - 10a  . omega-3 acid ethyl esters  1 g Oral Daily  . pantoprazole  40 mg Oral Daily  . polyethylene glycol  17 g Oral Daily  . potassium chloride  10 mEq Oral q morning - 10a   Continuous Infusions: . 0.9 % NaCl with KCl 20 mEq / L 100 mL/hr at 05/28/13 0558    Time spent: 35 minutes  [facility] Pager [phone]. If 7PM-7AM, please contact night-coverage at [email or web address], password [facility] 05/28/2013, 11:12 AM  LOS: 2 days

## 2013-05-28 NOTE — Clinical Social Work Note (Signed)
CSW has faxed clinical information to [facility] for possible admission over weekend. Admissions liaison informed CSW that she has received clinical information and that [facility] is able to admit over weekend. Per admissions liaison, MD will need to document in note that pt does not have TB. MD notified and stated she will include information in discharge summary at time of discharge. RN and family notified of information above.   CSW has left handoff for weekend CSW regarding possible weekend discharge.  Pati Gallo, [place] Social Worker [phone]

## 2013-05-29 DIAGNOSIS — L97409 Non-pressure chronic ulcer of unspecified heel and midfoot with unspecified severity: Secondary | ICD-10-CM

## 2013-05-29 LAB — URINE CULTURE: Colony Count: 90000

## 2013-05-29 MED ORDER — DIAZEPAM 2 MG PO TABS: 2.0000 mg | ORAL_TABLET | Freq: Every evening | ORAL | Status: DC | PRN

## 2013-05-29 MED ORDER — OXYCODONE-ACETAMINOPHEN 5-325 MG PO TABS: 1.0000 | ORAL_TABLET | ORAL | Status: DC | PRN

## 2013-05-29 MED ORDER — ENSURE COMPLETE PO LIQD: 237.0000 mL | Freq: Three times a day (TID) | ORAL | Status: AC

## 2013-05-29 MED ORDER — POLYETHYLENE GLYCOL 3350 17 G PO PACK
17.0000 g | PACK | Freq: Every day | ORAL | Status: DC
Start: 2013-05-29 — End: 2013-10-01

## 2013-05-29 MED ORDER — OMEPRAZOLE 40 MG PO CPDR: 40.0000 mg | DELAYED_RELEASE_CAPSULE | Freq: Every morning | ORAL | Status: AC

## 2013-05-29 MED ORDER — ACETAMINOPHEN 325 MG PO TABS: 650.0000 mg | ORAL_TABLET | Freq: Four times a day (QID) | ORAL | Status: DC | PRN

## 2013-05-29 MED ORDER — OMEGA-3-ACID ETHYL ESTERS 1 G PO CAPS: 1.0000 g | ORAL_CAPSULE | Freq: Every day | ORAL | Status: DC

## 2013-05-29 MED ORDER — COLLAGENASE 250 UNIT/GM EX OINT: TOPICAL_OINTMENT | Freq: Every day | CUTANEOUS | Status: DC

## 2013-05-29 MED ORDER — POTASSIUM CHLORIDE ER 10 MEQ PO TBCR: 10.0000 meq | EXTENDED_RELEASE_TABLET | Freq: Every morning | ORAL | Status: DC

## 2013-05-29 NOTE — Progress Notes (Signed)
Patient discharged to [facility], report given to nurse Jeanmarie Plant, transported by [facility].

## 2013-05-29 NOTE — Progress Notes (Addendum)
Physician Discharge Summary  [name] [id number] DOB: [birth year] DOA: 05/26/2013  PCP: Tommy Medal, MD  Admit date: 05/26/2013 Discharge date: 05/29/2013  Recommendations for Outpatient Follow-up:  1. See discharge instructions below  Discharge Diagnoses:  Principal Problem:   Abdominal pain, possibly referred pain/radiculopathy from T11 compression fracture Active Problems:   Esophageal reflux   Constipation, chronic Left heel pressure sore, present on admission   Decubitus ulcer limited to breakdown of skin (   Nonspecific (abnormal) findings on radiological and other examination of biliary tract   Vertebral compression fracture, T11   Physical deconditioning   Dehydration   Dilated bile duct   Protein-calorie malnutrition, severe Sacral decubitus: present on admission  Code status: DNR  Discharge Condition: stable  Filed Weights   05/26/13 1500  Weight: 58.06 kg (128 lb)    History of present illness:  80 y.o. female with pmh significant for GERD, left hip fracture s/p ORIF; diverticulosis, internal hemorrhoids and OA; came to ED complaining of abdominal pain and nausea. Patient denies any fever, no vomiting, no SOB and CP. Patient in Ed was found with abnormal CBD dilatation on CT abd/pelvis and positive FOBT. Normal LFT's.  Of note, patient recently started on ciprofloxacin for UTI (05/24/13); otherwise exam and blood work negative.  [facility] called to admit patient for further evaluation and treatment.   Hospital Course:   Admitted to observation, hospitalist service. GI consulted.   Abdominal pain, left: could be radiculopathy from T11 fracture.  Suspect nausea related to Cipro (patient has completed course). MRCP shows no definite mass or obstructing stone.  Per family, has not been eating much "for a long time". Discussed the possibility of hospice/comfort care. Son thinks pt is "not yet ready for hospice." Patient reports feeling better and RNs report eating  50% - 80% of meals. GI recommended no further workup in this elderly debilitated lady. The patient wanted no further workup as well. Active Problems:  Vertebral compression fracture, T11, likely acute. Pt does not want MRI or consideration for vertebroplasty. Pain is better controlled. She may continue to work with PT OT at [facility] assisted living. Esophageal reflux  Constipation, chronic: Patient had a bowel movement today. This has not been a problem while hospitalized. Ulcer of left heel, present prior to admission. Wound care consult recommended foam dressing, float heels as much a possible and an air mattress overlay. Home health RN arranged by care management Decubitus ulcer limited to breakdown of skin: Wound care has recommended Santyl ointment covered by moist gauze and tape daily. Nonspecific (abnormal) findings on radiological and other examination of biliary tract: See above Physical deconditioning: Patient had been at independent living. The son has arranged for an assisted living. Continue physical and occupational therapy Dehydration: now euvolemic after IV hydration Protein calorie malnutrition, severe: started on ensure  Per ALF requested documentation: patient does not have TB  Procedures:  none  Consultations:  WOC, PT, SW  Discharge Exam: Filed Vitals:   05/29/13 0529  BP: 94/40  Pulse: 87  Temp: 98 F (36.7 C)  Resp: 15    General: comfortable. Just had large semiformed stool Cardiovascular: RRR Respiratory: CTA Abd: S, NT, ND Ext Edema  Discharge Instructions  Discharge Orders   Future Orders Complete By Expires   Activity up with assistance As directed    Diet general  As directed    Discharge instructions  As directed    Comments:     Air mattress overlay   Discharge wound  care:  As directed    Comments:     Foam dressing to left heel. Change every 5 days or as needed for soilage.  Santyl to sacral wound daily. Cover with moist gauze  and tape.  Float heels as much as possible. Continue PT, OT,        Medication List    STOP taking these medications       ciprofloxacin 500 MG tablet  Commonly known as:  CIPRO     ICAPS AREDS FORMULA PO     OVER THE COUNTER MEDICATION     oxyCODONE 5 MG immediate release tablet  Commonly known as:  Oxy IR/ROXICODONE      TAKE these medications       acetaminophen 325 MG tablet  Commonly known as:  TYLENOL  Take 2 tablets (650 mg total) by mouth every 6 (six) hours as needed for mild pain or fever (or Fever >/= 101).     collagenase ointment  Commonly known as:  SANTYL  Apply topically daily. To sacral wound daily     diazepam 2 MG tablet  Commonly known as:  VALIUM  Take 1 tablet (2 mg total) by mouth at bedtime as needed for anxiety (sleep).     feeding supplement (ENSURE COMPLETE) Liqd  Take 237 mLs by mouth 3 (three) times daily between meals.     multivitamin with minerals Tabs tablet  Take 1 tablet by mouth every morning.     omega-3 acid ethyl esters 1 G capsule  Commonly known as:  LOVAZA  Take 1 capsule (1 g total) by mouth daily.     omeprazole 40 MG capsule  Commonly known as:  PRILOSEC  Take 1 capsule (40 mg total) by mouth every morning.     oxyCODONE-acetaminophen 5-325 MG per tablet  Commonly known as:  PERCOCET/ROXICET  Take 1 tablet by mouth every 4 (four) hours as needed for severe pain.     polyethylene glycol packet  Commonly known as:  MIRALAX / GLYCOLAX  Take 17 g by mouth daily.     potassium chloride 10 MEQ tablet  Commonly known as:  K-DUR  Take 1 tablet (10 mEq total) by mouth every morning.       Allergies  Allergen Reactions  . Penicillins Other (See Comments)    Childhood allergy; reaction unknown       Follow-up Information   Follow up with PANG,RICHARD, MD In 1 week.   Specialty:  Internal Medicine   Contact information:   [address], [place] [address] [phone]        The results  of significant diagnostics from this hospitalization (including imaging, microbiology, ancillary and laboratory) are listed below for reference.    Significant Diagnostic Studies: US Abdomen Complete  05/26/2013   CLINICAL DATA Rule out biliary obstruction.  Abdominal pain  EXAM ULTRASOUND ABDOMEN COMPLETE  COMPARISON CT abdomen from today.  MRCP 09/13/2009  FINDINGS Gallbladder:  Surgically absent gallbladder  Common bile duct:  Diameter: Biliary ductal dilatation. Intrahepatic bile ducts are dilated. Common bile duct measures up to 14 mm. No ductal stone identified. No obstructing mass identified however further anatomic evaluation with MRCP is suggested as there was a small cystic lesion in the head of the pancreas on the prior MRCP.  Liver:  No focal lesion identified. Within normal limits in parenchymal echogenicity.  IVC:  No abnormality visualized.  Pancreas:  Visualized portion unremarkable.  Spleen:  Size and appearance within normal  limits.  Right Kidney:  Length: 10.5 cm. Echogenicity within normal limits. No mass or hydronephrosis visualized.  Left Kidney:  Length: 9.5 cm. Echogenicity within normal limits. No mass or hydronephrosis visualized.  Abdominal aorta:  No aneurysm visualized.  Other findings:  Small left pleural effusion  IMPRESSION Biliary ductal dilatation with the common bile duct measuring up to 14 mm. This could be due to a stone or stricture or mass. Follow-up MRCP suggested. There was a cystic lesion in the head of the pancreas on the MRCP of 09/13/2009.  SIGNATURE  Electronically Signed   By: Franchot Gallo M.D.   On: 05/26/2013 10:14   Ct Abdomen Pelvis W Contrast  05/26/2013   CLINICAL DATA Abdominal pain  EXAM CT ABDOMEN AND PELVIS WITH CONTRAST  TECHNIQUE Multidetector CT imaging of the abdomen and pelvis was performed using the standard protocol following bolus administration of intravenous contrast.  CONTRAST 68mL OMNIPAQUE IOHEXOL 300 MG/ML  SOLN  COMPARISON CT 12/17/2007   FINDINGS Small left pleural effusion and left lower lobe atelectasis. Right lung base is clear.  Heart size is normal.  Mild coronary artery calcification.  Bile ducts are dilated. Intrahepatic and extrahepatic bile ducts are dilated. Common hepatic duct measures 12 mm. Common bile duct measures 10 mm. This has increased significantly since the prior study and may reflect a common duct stone or stricture. No definite neoplasm. There is evidence of chronic calcific pancreatitis, unchanged. No evidence of acute pancreatitis. The spleen is normal.  The kidneys show no obstruction or mass.  Negative for bowel obstruction. Sigmoid diverticulosis. No free fluid in the pelvis. No mass or adenopathy.  IMPRESSION Small left pleural effusion and left lower lobe atelectasis  Biliary ductal dilatation. This may be due to a stone or stricture. There is evidence of chronic calcific pancreatitis but no evidence of acute appendicitis or mass lesion is identified.  SIGNATURE  Electronically Signed   By: Franchot Gallo M.D.   On: 05/26/2013 08:43   Dg Chest Port 1 View  05/27/2013   CLINICAL DATA:  Chest pain.  EXAM: PORTABLE CHEST - 1 VIEW  COMPARISON:  12/15/2012.  FINDINGS: The heart is enlarged but stable. There is tortuosity, ectasia and calcification of the thoracic aorta. There are chronic lung changes and elevation of the left hemidiaphragm with overlying vascular crowding and atelectasis. Remote healed rib fractures are noted. No definite acute pulmonary findings.  IMPRESSION: Cardiac enlargement and chronic lung changes. No definite acute overlying pulmonary process.   Electronically Signed   By: Kalman Jewels M.D.   On: 05/27/2013 17:51   Mr Abd W/wo Cm/mrcp  05/27/2013   CLINICAL DATA:  Abdominal and flank pain. Dilated biliary tree on CT and ultrasound.  EXAM: MRI ABDOMEN WITHOUT AND WITH CONTRAST (INCLUDING MRCP)  TECHNIQUE: Multiplanar multisequence MR imaging of the abdomen was performed both before and after  the administration of intravenous contrast. Heavily T2-weighted images of the biliary and pancreatic ducts were obtained, and three-dimensional MRCP images were rendered by post processing.  CONTRAST:  74mL MULTIHANCE GADOBENATE DIMEGLUMINE 529 MG/ML IV SOLN  COMPARISON:  US ABDOMEN COMPLETE dated 05/26/2013; CT ABD/PELVIS W CM dated 05/26/2013; MR MRCP dated 09/13/2009  FINDINGS: Portions of exam are mildly motion degraded.  Small left greater than right bilateral pleural effusions. Left base airspace disease which is favored to represent atelectasis.  Compression deformities at T12, T10, T9, in T7. T11 compression deformity is likely subacute. Edema and fluid identified within the inferior aspect of the  vertebral body on image 26/series 8. Abnormal enhancement in this area, including on image 12/series 16.  Normal liver, with development of intrahepatic ductal dilatation since 09/13/2009. Normal spleen, stomach, adrenal glands. Normal kidneys for age.  Pancreatic atrophy. A 5 mm cystic lesion in the pancreatic tail is similar to the 09/13/2009 MR and likely a small pseudocyst or area of branch duct ectasia. Other foci of mild branch duct ectasia throughout the pancreatic parenchyma are again identified.  A simple appearing cystic lesion arises from the pancreatic duct within the uncinate process. This measures maximally 1.9 cm on transverse image 25/series 4 versus 2.5 cm on 09/13/2009. No enhancement in this area.  The common duct measures maximally 1.3 cm in the porta hepatis on image 44/series 6. Increased from 1.0 cm at the same level on 09/13/2009. Followed to the level of the ampulla, without evidence of obstructive stone or mass.  Normal caliber of abdominal bowel loops.  Incompletely imaged bladder distension.  IMPRESSION: 1. Since 09/13/2009, new and progressive biliary ductal dilatation, without evidence of obstructive stone or mass. Although this could be within normal variation in this age group,  periampullary stricture (or otherwise occult ampullary lesion) cannot be excluded. 2. Findings of chronic pancreatitis with decreased size of a pseudocyst within the uncinate process. 3. Thoracic compression deformities. A T11 compression deformity is likely acute. Focal edema/fluid within the vertebral body and surrounding hyper enhancement are likely secondary. Especially if there is a clinical concern of infection, which cannot be excluded, dedicated pre and post contrast thoracic spine MRI should be considered. 4. Small bilateral pleural effusions with left base airspace disease. Favor atelectasis. 5. Mild motion degradation.   Electronically Signed   By: Abigail Miyamoto M.D.   On: 05/27/2013 13:45    Microbiology: Recent Results (from the past 240 hour(s))  URINE CULTURE     Status: None   Collection Time    05/26/13 10:39 AM      Result Value Ref Range Status   Specimen Description URINE, CATHETERIZED   Final   Special Requests NONE   Final   Culture  Setup Time     Final   Value: 05/26/2013 15:12     Performed at [place]     Final   Value: 90,000 COLONIES/ML     Performed at [facility]   Culture     Final   Value: STAPHYLOCOCCUS SPECIES (COAGULASE NEGATIVE)     Note: RIFAMPIN AND GENTAMICIN SHOULD NOT BE USED AS SINGLE DRUGS FOR TREATMENT OF STAPH INFECTIONS.     Performed at [facility]   Report Status 05/29/2013 FINAL   Final   Organism ID, Bacteria STAPHYLOCOCCUS SPECIES (COAGULASE NEGATIVE)   Final     Labs: Basic Metabolic Panel:  Recent Labs Lab 05/26/13 0718 05/27/13 0655  NA 140 140  K 3.6* 3.9  CL 100 104  CO2  --  26  GLUCOSE 124* 85  BUN 15 11  CREATININE 1.00 0.64  CALCIUM  --  8.8   Liver Function Tests:  Recent Labs Lab 05/26/13 0708  AST 27  ALT 13  ALKPHOS 144*  BILITOT 0.5  PROT 6.5  ALBUMIN 2.5*    Recent Labs Lab 05/26/13 0708  LIPASE 24   No results found for this basename: AMMONIA,  in  the last 168 hours CBC:  Recent Labs Lab 05/26/13 0708 05/26/13 0718 05/27/13 0655  WBC 7.3  --  8.5  NEUTROABS 4.4  --   --  HGB 12.0 12.6 11.3*  HCT 36.6 37.0 35.9*  MCV 99.2  --  102.0*  PLT 245  --  205   Cardiac Enzymes: No results found for this basename: CKTOTAL, CKMB, CKMBINDEX, TROPONINI,  in the last 168 hours BNP: BNP (last 3 results) No results found for this basename: PROBNP,  in the last 8760 hours CBG: No results found for this basename: GLUCAP,  in the last 168 hours     Signed:  Pringle L  [facility] 05/29/2013, 9:26 AM

## 2013-05-29 NOTE — Progress Notes (Signed)
Very loose stool

## 2013-05-29 NOTE — Progress Notes (Signed)
Patient is medically stable for D/C to [facility] today. Per Gae Bon supervisor at ALF patient is going to room 243 and RN will call report to [facility] at [phone]. Clinical Education officer, museum (CSW) prepared D/C packet and arranged non-emergency EMS ([facility]) for transport. CSW faxed Gae Bon D/C summary and FL2. Gae Bon reported that she received the paper work. CSW contacted patient's son Clare Gandy and made him aware of above. Nursing is aware of above. Please reconsult if further social work needs arise. CSW signing off.   Blima Rich, [place] Weekend CSW [phone]

## 2013-06-02 ENCOUNTER — Other Ambulatory Visit (HOSPITAL_COMMUNITY): Payer: Self-pay | Admitting: Internal Medicine

## 2013-06-02 DIAGNOSIS — IMO0002 Reserved for concepts with insufficient information to code with codable children: Secondary | ICD-10-CM

## 2013-06-02 DIAGNOSIS — M549 Dorsalgia, unspecified: Secondary | ICD-10-CM

## 2013-06-03 NOTE — Discharge Summary (Deleted)
Physician Discharge Summary   [name] [id number] DOB: 05-Aug-1915 DOA: 05/26/2013  PCP: Tommy Medal, MD  Admit date: 05/26/2013  Discharge date: 05/29/2013  Recommendations for Outpatient Follow-up:  1. See discharge instructions below Discharge Diagnoses:  Principal Problem:  Abdominal pain, possibly referred pain/radiculopathy from T11 compression fracture  Active Problems:  Esophageal reflux  Constipation, chronic  Left heel pressure sore, present on admission  Decubitus ulcer limited to breakdown of skin (  Nonspecific (abnormal) findings on radiological and other examination of biliary tract  Vertebral compression fracture, T11  Physical deconditioning  Dehydration  Dilated bile duct  Protein-calorie malnutrition, severe  Sacral decubitus: present on admission  Code status: DNR  Discharge Condition: stable  Filed Weights    05/26/13 1500   Weight:  58.06 kg (128 lb)    History of present illness:  80 y.o. female with pmh significant for GERD, left hip fracture s/p ORIF; diverticulosis, internal hemorrhoids and OA; came to ED complaining of abdominal pain and nausea. Patient denies any fever, no vomiting, no SOB and CP. Patient in Ed was found with abnormal CBD dilatation on CT abd/pelvis and positive FOBT. Normal LFT's.  Of note, patient recently started on ciprofloxacin for UTI (05/24/13); otherwise exam and blood work negative.  [facility] called to admit patient for further evaluation and treatment.  Hospital Course:  Admitted to observation, hospitalist service. GI consulted.  Abdominal pain, left: could be radiculopathy from T11 fracture. Suspect nausea related to Cipro (patient has completed course). MRCP shows no definite mass or obstructing stone. Per family, has not been eating much "for a long time". Discussed the possibility of hospice/comfort care. Son thinks pt is "not yet ready for hospice." Patient reports feeling better and RNs report eating 50% - 80% of meals.  GI recommended no further workup in this elderly debilitated lady. The patient wanted no further workup as well.  Active Problems:  Vertebral compression fracture, T11, likely acute. Pt does not want MRI or consideration for vertebroplasty. Pain is better controlled. She may continue to work with PT OT at [facility] assisted living.  Esophageal reflux  Constipation, chronic: Patient had a bowel movement today. This has not been a problem while hospitalized.  Ulcer of left heel, present prior to admission. Wound care consult recommended foam dressing, float heels as much a possible and an air mattress overlay. Home health RN arranged by care management  Decubitus ulcer limited to breakdown of skin: Wound care has recommended Santyl ointment covered by moist gauze and tape daily.  Nonspecific (abnormal) findings on radiological and other examination of biliary tract: See above  Physical deconditioning: Patient had been at independent living. The son has arranged for an assisted living. Continue physical and occupational therapy  Dehydration: now euvolemic after IV hydration  Protein calorie malnutrition, severe: started on ensure  Per ALF requested documentation: patient does not have TB  Procedures:  none Consultations:  WOC, PT, SW Discharge Exam:  Filed Vitals:    05/29/13 0529   BP:  94/40   Pulse:  87   Temp:  98 F (36.7 C)   Resp:  15    General: comfortable. Just had large semiformed stool  Cardiovascular: RRR  Respiratory: CTA  Abd: S, NT, ND  Ext Edema  Discharge Instructions  Discharge Orders    Future Orders  Complete By  Expires    Activity up with assistance  As directed     Diet general  As directed     Discharge  instructions  As directed     Comments:    Air mattress overlay    Discharge wound care:  As directed     Comments:    Foam dressing to left heel. Change every 5 days or as needed for soilage. Santyl to sacral wound daily. Cover with moist gauze and  tape. Float heels as much as possible. Continue PT, OT,        Medication List     STOP taking these medications       ciprofloxacin 500 MG tablet    Commonly known as: CIPRO    ICAPS AREDS FORMULA PO    OVER THE COUNTER MEDICATION    oxyCODONE 5 MG immediate release tablet    Commonly known as: Oxy IR/ROXICODONE     TAKE these medications       acetaminophen 325 MG tablet    Commonly known as: TYLENOL    Take 2 tablets (650 mg total) by mouth every 6 (six) hours as needed for mild pain or fever (or Fever >/= 101).    collagenase ointment    Commonly known as: SANTYL    Apply topically daily. To sacral wound daily    diazepam 2 MG tablet    Commonly known as: VALIUM    Take 1 tablet (2 mg total) by mouth at bedtime as needed for anxiety (sleep).    feeding supplement (ENSURE COMPLETE) Liqd    Take 237 mLs by mouth 3 (three) times daily between meals.    multivitamin with minerals Tabs tablet    Take 1 tablet by mouth every morning.    omega-3 acid ethyl esters 1 G capsule    Commonly known as: LOVAZA    Take 1 capsule (1 g total) by mouth daily.    omeprazole 40 MG capsule    Commonly known as: PRILOSEC    Take 1 capsule (40 mg total) by mouth every morning.    oxyCODONE-acetaminophen 5-325 MG per tablet    Commonly known as: PERCOCET/ROXICET    Take 1 tablet by mouth every 4 (four) hours as needed for severe pain.    polyethylene glycol packet    Commonly known as: MIRALAX / GLYCOLAX    Take 17 g by mouth daily.    potassium chloride 10 MEQ tablet    Commonly known as: K-DUR    Take 1 tablet (10 mEq total) by mouth every morning.      Allergies   Allergen  Reactions   .  Penicillins  Other (See Comments)     Childhood allergy; reaction unknown        Follow-up Information    Follow up with PANG,RICHARD, MD In 1 week.    Specialty: Internal Medicine    Contact information:    [address] [place]  [address]  [phone]       The  results of significant diagnostics from this hospitalization (including imaging, microbiology, ancillary and laboratory) are listed below for reference.   Significant Diagnostic Studies:  US Abdomen Complete  05/26/2013 CLINICAL DATA Rule out biliary obstruction. Abdominal pain EXAM ULTRASOUND ABDOMEN COMPLETE COMPARISON CT abdomen from today. MRCP 09/13/2009 FINDINGS Gallbladder: Surgically absent gallbladder Common bile duct: Diameter: Biliary ductal dilatation. Intrahepatic bile ducts are dilated. Common bile duct measures up to 14 mm. No ductal stone identified. No obstructing mass identified however further anatomic evaluation with MRCP is suggested as there was a small cystic lesion in the head of the pancreas on  the prior MRCP. Liver: No focal lesion identified. Within normal limits in parenchymal echogenicity. IVC: No abnormality visualized. Pancreas: Visualized portion unremarkable. Spleen: Size and appearance within normal limits. Right Kidney: Length: 10.5 cm. Echogenicity within normal limits. No mass or hydronephrosis visualized. Left Kidney: Length: 9.5 cm. Echogenicity within normal limits. No mass or hydronephrosis visualized. Abdominal aorta: No aneurysm visualized. Other findings: Small left pleural effusion IMPRESSION Biliary ductal dilatation with the common bile duct measuring up to 14 mm. This could be due to a stone or stricture or mass. Follow-up MRCP suggested. There was a cystic lesion in the head of the pancreas on the MRCP of 09/13/2009. SIGNATURE Electronically Signed By: Franchot Gallo M.D. On: 05/26/2013 10:14  Ct Abdomen Pelvis W Contrast  05/26/2013 CLINICAL DATA Abdominal pain EXAM CT ABDOMEN AND PELVIS WITH CONTRAST TECHNIQUE Multidetector CT imaging of the abdomen and pelvis was performed using the standard protocol following bolus administration of intravenous contrast. CONTRAST 20mL OMNIPAQUE IOHEXOL 300 MG/ML SOLN COMPARISON CT 12/17/2007 FINDINGS Small left pleural effusion  and left lower lobe atelectasis. Right lung base is clear. Heart size is normal. Mild coronary artery calcification. Bile ducts are dilated. Intrahepatic and extrahepatic bile ducts are dilated. Common hepatic duct measures 12 mm. Common bile duct measures 10 mm. This has increased significantly since the prior study and may reflect a common duct stone or stricture. No definite neoplasm. There is evidence of chronic calcific pancreatitis, unchanged. No evidence of acute pancreatitis. The spleen is normal. The kidneys show no obstruction or mass. Negative for bowel obstruction. Sigmoid diverticulosis. No free fluid in the pelvis. No mass or adenopathy. IMPRESSION Small left pleural effusion and left lower lobe atelectasis Biliary ductal dilatation. This may be due to a stone or stricture. There is evidence of chronic calcific pancreatitis but no evidence of acute appendicitis or mass lesion is identified. SIGNATURE Electronically Signed By: Franchot Gallo M.D. On: 05/26/2013 08:43  Dg Chest Port 1 View  05/27/2013 CLINICAL DATA: Chest pain. EXAM: PORTABLE CHEST - 1 VIEW COMPARISON: 12/15/2012. FINDINGS: The heart is enlarged but stable. There is tortuosity, ectasia and calcification of the thoracic aorta. There are chronic lung changes and elevation of the left hemidiaphragm with overlying vascular crowding and atelectasis. Remote healed rib fractures are noted. No definite acute pulmonary findings. IMPRESSION: Cardiac enlargement and chronic lung changes. No definite acute overlying pulmonary process. Electronically Signed By: Kalman Jewels M.D. On: 05/27/2013 17:51  Mr Abd W/wo Cm/mrcp  05/27/2013 CLINICAL DATA: Abdominal and flank pain. Dilated biliary tree on CT and ultrasound. EXAM: MRI ABDOMEN WITHOUT AND WITH CONTRAST (INCLUDING MRCP) TECHNIQUE: Multiplanar multisequence MR imaging of the abdomen was performed both before and after the administration of intravenous contrast. Heavily T2-weighted images of the  biliary and pancreatic ducts were obtained, and three-dimensional MRCP images were rendered by post processing. CONTRAST: 76mL MULTIHANCE GADOBENATE DIMEGLUMINE 529 MG/ML IV SOLN COMPARISON: US ABDOMEN COMPLETE dated 05/26/2013; CT ABD/PELVIS W CM dated 05/26/2013; MR MRCP dated 09/13/2009 FINDINGS: Portions of exam are mildly motion degraded. Small left greater than right bilateral pleural effusions. Left base airspace disease which is favored to represent atelectasis. Compression deformities at T12, T10, T9, in T7. T11 compression deformity is likely subacute. Edema and fluid identified within the inferior aspect of the vertebral body on image 26/series 8. Abnormal enhancement in this area, including on image 12/series 16. Normal liver, with development of intrahepatic ductal dilatation since 09/13/2009. Normal spleen, stomach, adrenal glands. Normal kidneys for age. Pancreatic atrophy. A 5 mm  cystic lesion in the pancreatic tail is similar to the 09/13/2009 MR and likely a small pseudocyst or area of branch duct ectasia. Other foci of mild branch duct ectasia throughout the pancreatic parenchyma are again identified. A simple appearing cystic lesion arises from the pancreatic duct within the uncinate process. This measures maximally 1.9 cm on transverse image 25/series 4 versus 2.5 cm on 09/13/2009. No enhancement in this area. The common duct measures maximally 1.3 cm in the porta hepatis on image 44/series 6. Increased from 1.0 cm at the same level on 09/13/2009. Followed to the level of the ampulla, without evidence of obstructive stone or mass. Normal caliber of abdominal bowel loops. Incompletely imaged bladder distension. IMPRESSION: 1. Since 09/13/2009, new and progressive biliary ductal dilatation, without evidence of obstructive stone or mass. Although this could be within normal variation in this age group, periampullary stricture (or otherwise occult ampullary lesion) cannot be excluded. 2. Findings of  chronic pancreatitis with decreased size of a pseudocyst within the uncinate process. 3. Thoracic compression deformities. A T11 compression deformity is likely acute. Focal edema/fluid within the vertebral body and surrounding hyper enhancement are likely secondary. Especially if there is a clinical concern of infection, which cannot be excluded, dedicated pre and post contrast thoracic spine MRI should be considered. 4. Small bilateral pleural effusions with left base airspace disease. Favor atelectasis. 5. Mild motion degradation. Electronically Signed By: Abigail Miyamoto M.D. On: 05/27/2013 13:45  Microbiology:  Recent Results (from the past 240 hour(s))   URINE CULTURE Status: None    Collection Time    05/26/13 10:39 AM   Result  Value  Ref Range  Status    Specimen Description  URINE, CATHETERIZED   Final    Special Requests  NONE   Final    Culture Setup Time    Final    Value:  05/26/2013 15:12     Performed at [facility]    Final    Value:  90,000 COLONIES/ML     Performed at [facility]    Culture    Final    Value:  STAPHYLOCOCCUS SPECIES (COAGULASE NEGATIVE)     Note: RIFAMPIN AND GENTAMICIN SHOULD NOT BE USED AS SINGLE DRUGS FOR TREATMENT OF STAPH INFECTIONS.     Performed at [facility]    Report Status  05/29/2013 FINAL   Final    Organism ID, Bacteria  STAPHYLOCOCCUS SPECIES (COAGULASE NEGATIVE)   Final    Labs:  Basic Metabolic Panel:   Recent Labs  Lab  05/26/13 0718  05/27/13 0655   NA  140  140   K  3.6*  3.9   CL  100  104   CO2  --  26   GLUCOSE  124*  85   BUN  15  11   CREATININE  1.00  0.64   CALCIUM  --  8.8    Liver Function Tests:   Recent Labs  Lab  05/26/13 0708   AST  27   ALT  13   ALKPHOS  144*   BILITOT  0.5   PROT  6.5   ALBUMIN  2.5*     Recent Labs  Lab  05/26/13 0708   LIPASE  24    No results found for this basename: AMMONIA, in the last 168 hours  CBC:   Recent Labs  Lab   05/26/13 0708  05/26/13 0718  05/27/13 0655   WBC  7.3  --  8.5   NEUTROABS  4.4  --  --   HGB  12.0  12.6  11.3*   HCT  36.6  37.0  35.9*   MCV  99.2  --  102.0*   PLT  245  --  205    Cardiac Enzymes:  No results found for this basename: CKTOTAL, CKMB, CKMBINDEX, TROPONINI, in the last 168 hours  BNP:  BNP (last 3 results)  No results found for this basename: PROBNP, in the last 8760 hours  CBG:  No results found for this basename: GLUCAP, in the last 168 hours  Signed:  Milton L  [facility]  05/29/2013, 9:26 AM

## 2013-06-08 ENCOUNTER — Ambulatory Visit (HOSPITAL_COMMUNITY)
Admission: RE | Admit: 2013-06-08 | Discharge: 2013-06-08 | Disposition: A | Discharge status: Home / Self Care | Payer: Medicare Other | Source: Ambulatory Visit | Attending: Internal Medicine | Admitting: Internal Medicine

## 2013-06-08 DIAGNOSIS — M549 Dorsalgia, unspecified: Secondary | ICD-10-CM

## 2013-06-08 DIAGNOSIS — IMO0002 Reserved for concepts with insufficient information to code with codable children: Secondary | ICD-10-CM

## 2013-06-10 ENCOUNTER — Other Ambulatory Visit (HOSPITAL_COMMUNITY): Payer: Self-pay | Admitting: Interventional Radiology

## 2013-06-10 DIAGNOSIS — S22009A Unspecified fracture of unspecified thoracic vertebra, initial encounter for closed fracture: Secondary | ICD-10-CM

## 2013-06-10 DIAGNOSIS — M549 Dorsalgia, unspecified: Secondary | ICD-10-CM

## 2013-06-10 DIAGNOSIS — IMO0002 Reserved for concepts with insufficient information to code with codable children: Secondary | ICD-10-CM

## 2013-06-11 ENCOUNTER — Other Ambulatory Visit: Payer: Self-pay | Admitting: Radiology

## 2013-06-11 ENCOUNTER — Encounter (HOSPITAL_COMMUNITY): Payer: Self-pay

## 2013-06-15 ENCOUNTER — Encounter (HOSPITAL_COMMUNITY): Payer: Self-pay

## 2013-06-15 ENCOUNTER — Ambulatory Visit (HOSPITAL_COMMUNITY)
Admission: RE | Admit: 2013-06-15 | Discharge: 2013-06-15 | Disposition: A | Discharge status: Home / Self Care | Payer: Medicare Other | Source: Ambulatory Visit | Attending: Interventional Radiology | Admitting: Interventional Radiology

## 2013-06-15 DIAGNOSIS — I2510 Atherosclerotic heart disease of native coronary artery without angina pectoris: Secondary | ICD-10-CM | POA: Insufficient documentation

## 2013-06-15 DIAGNOSIS — I252 Old myocardial infarction: Secondary | ICD-10-CM | POA: Insufficient documentation

## 2013-06-15 DIAGNOSIS — M4800 Spinal stenosis, site unspecified: Secondary | ICD-10-CM | POA: Insufficient documentation

## 2013-06-15 DIAGNOSIS — K219 Gastro-esophageal reflux disease without esophagitis: Secondary | ICD-10-CM | POA: Insufficient documentation

## 2013-06-15 DIAGNOSIS — W19XXXA Unspecified fall, initial encounter: Secondary | ICD-10-CM | POA: Insufficient documentation

## 2013-06-15 DIAGNOSIS — S22009A Unspecified fracture of unspecified thoracic vertebra, initial encounter for closed fracture: Secondary | ICD-10-CM | POA: Insufficient documentation

## 2013-06-15 DIAGNOSIS — M549 Dorsalgia, unspecified: Secondary | ICD-10-CM

## 2013-06-15 LAB — CBC
HCT: 38.6 % (ref 36.0–46.0)
Hemoglobin: 12.9 g/dL (ref 12.0–15.0)
MCH: 32.7 pg (ref 26.0–34.0)
MCHC: 33.4 g/dL (ref 30.0–36.0)
MCV: 98 fL (ref 78.0–100.0)
Platelets: 238 10*3/uL (ref 150–400)
RBC: 3.94 MIL/uL (ref 3.87–5.11)
RDW: 15.4 % (ref 11.5–15.5)
WBC: 6.9 10*3/uL (ref 4.0–10.5)

## 2013-06-15 LAB — PROTIME-INR
INR: 1.15 (ref 0.00–1.49)
Prothrombin Time: 14.5 s (ref 11.6–15.2)

## 2013-06-15 LAB — BASIC METABOLIC PANEL
BUN: 19 mg/dL (ref 6–23)
CO2: 30 mEq/L (ref 19–32)
Calcium: 9.5 mg/dL (ref 8.4–10.5)
Chloride: 100 mEq/L (ref 96–112)
Creatinine, Ser: 0.7 mg/dL (ref 0.50–1.10)
GFR calc Af Amer: 83 mL/min — ABNORMAL LOW (ref >90)
GFR calc non Af Amer: 71 mL/min — ABNORMAL LOW (ref >90)
GLUCOSE: 111 mg/dL — AB (ref 70–99)
POTASSIUM: 4.9 meq/L (ref 3.7–5.3)
Sodium: 138 mEq/L (ref 137–147)

## 2013-06-15 LAB — APTT: aPTT: 31 seconds (ref 24–37)

## 2013-06-15 MED ORDER — FENTANYL CITRATE 0.05 MG/ML IJ SOLN
INTRAMUSCULAR | Status: AC | PRN
  Administered 2013-06-15 (×2): 25 ug via INTRAVENOUS
  Administered 2013-06-15: 12.5 ug via INTRAVENOUS

## 2013-06-15 MED ORDER — SODIUM CHLORIDE 0.9 % IV SOLN
INTRAVENOUS | Status: DC
  Administered 2013-06-15: 11:00:00 via INTRAVENOUS

## 2013-06-15 MED ORDER — MIDAZOLAM HCL 2 MG/2ML IJ SOLN
INTRAMUSCULAR | Status: AC | PRN
  Administered 2013-06-15 (×2): 1 mg via INTRAVENOUS

## 2013-06-15 MED ORDER — SODIUM CHLORIDE 0.9 % IV SOLN: INTRAVENOUS | Status: AC

## 2013-06-15 MED ORDER — HYDROMORPHONE HCL PF 1 MG/ML IJ SOLN
INTRAMUSCULAR | Status: AC
  Filled 2013-06-15: qty 1

## 2013-06-15 MED ORDER — FLUMAZENIL 0.5 MG/5ML IV SOLN
INTRAVENOUS | Status: AC
  Filled 2013-06-15: qty 5

## 2013-06-15 MED ORDER — NALOXONE HCL 1 MG/ML IJ SOLN
INTRAMUSCULAR | Status: AC
  Filled 2013-06-15: qty 2

## 2013-06-15 MED ORDER — VANCOMYCIN HCL IN DEXTROSE 1-5 GM/200ML-% IV SOLN
1000.0000 mg | INTRAVENOUS | Status: AC
  Administered 2013-06-15: 1000 mg via INTRAVENOUS
  Filled 2013-06-15: qty 200

## 2013-06-15 MED ORDER — FENTANYL CITRATE 0.05 MG/ML IJ SOLN
INTRAMUSCULAR | Status: AC
  Filled 2013-06-15: qty 4

## 2013-06-15 MED ORDER — MIDAZOLAM HCL 2 MG/2ML IJ SOLN
INTRAMUSCULAR | Status: AC
  Filled 2013-06-15: qty 4

## 2013-06-15 MED ORDER — TOBRAMYCIN SULFATE 1.2 G IJ SOLR
INTRAMUSCULAR | Status: AC
  Filled 2013-06-15: qty 1.2

## 2013-06-15 NOTE — Sedation Documentation (Signed)
MD at bedside. 

## 2013-06-15 NOTE — Sedation Documentation (Signed)
To Nurses station in Radiology for [place], awaiting lab work.

## 2013-06-15 NOTE — Progress Notes (Signed)
Pt discharged back to [name].  Hard copy of discharge instructions sent with family and pt to give to [place] (nsg) Report also called to [place] in nursing area.  Pt and family verbalize understanding of discharge instructions

## 2013-06-15 NOTE — Procedures (Signed)
S/P T !! KP

## 2013-06-15 NOTE — Sedation Documentation (Signed)
Sat down to 89% on RA when sleeping.

## 2013-06-15 NOTE — Sedation Documentation (Signed)
O2 d/c'd 

## 2013-06-15 NOTE — Sedation Documentation (Signed)
Time to be done while on stretcher, meds given to enable pt to move to xray table.

## 2013-06-15 NOTE — Sedation Documentation (Signed)
Moved to IR 

## 2013-06-15 NOTE — Discharge Instructions (Signed)
1. No stopping,bending or lifting more than 10 lbs for 2 weeks. 2.Use walker to ambulate for 2 weeks. 3.RTC in 2 weeks.  KYPHOPLASTY/VERTEBROPLASTY DISCHARGE INSTRUCTIONS  Medications: (check all that apply)     Resume all home medications as before procedure.       Resume your (aspirin/Plavix/Coumadin) .                  Continue your pain medications as prescribed as needed.  Over the next 3-5 days, decrease your pain medication as tolerated.  Over the counter medications (i.e. Tylenol, ibuprofen, and aleve) may be substituted once severe/moderate pain symptoms have subsided.   Wound Care:   Bandages may be removed the day following your procedure.  You may get your incision wet once bandages are removed.  Bandaids may be used to cover the incisions until scab formation.  Topical ointments are optional.    If you develop a fever greater than 101 degrees, have increased skin redness at the incision sites or pus-like oozing from incisions occurring within 1 week of the procedure, contact radiology at [phone] or [phone].    Ice pack to back for 15-20 minutes 2-3 time per day for first 2-3 days post procedure.  The ice will expedite muscle healing and help with the pain from the incisions.   Activity:   Bedrest today with limited activity for 24 hours post procedure.    No driving for 48 hours.    Increase your activity as tolerated after bedrest (with assistance if necessary).    Refrain from any strenuous activity or heavy lifting (greater than 10 lbs.).   Follow up:   Contact radiology at [phone] or [phone] if any questions/concerns.    A physician assistant from radiology will contact you in approximately 1 week.    If a biopsy was performed at the time of your procedure, your referring physician should receive the results in usually 2-3 days.

## 2013-06-15 NOTE — H&P (Signed)
[name] is an 80 y.o. female.   Chief Complaint: back pain x 2 mo Has had 3 falls in 2 months per son MR reveals acute fracture of Thoracic 11 Consultation with Dr Estanislado Pandy last week Now scheduled for T11 KP/VP  HPI: CAD/MI; GERD; diverticulitis; spinal stenosis  Past Medical History  Diagnosis Date  . Arthritis   . Coronary artery disease   . GERD (gastroesophageal reflux disease)     Past Surgical History  Procedure Laterality Date  . Hip fracture surgery      x 3  . Orif femur fracture Left 12/13/2012    Procedure: OPEN REDUCTION INTERNAL FIXATION (ORIF) DISTAL FEMUR FRACTURE;  Surgeon: Mauri Pole, MD;  Location: [facility];  Service: Orthopedics;  Laterality: Left;  . Appendectomy    . Cholecystectomy      No family history on file. Social History:  reports that she has never smoked. She has never used smokeless tobacco. She reports that she does not drink alcohol or use illicit drugs.  Allergies:  Allergies  Allergen Reactions  . Penicillins Other (See Comments)    Childhood allergy; reaction unknown     (Not in a hospital admission)  Results for orders placed during the hospital encounter of 06/15/13 (from the past 48 hour(s))  CBC     Status: None   Collection Time    06/15/13 10:56 AM      Result Value Ref Range   WBC 6.9  4.0 - 10.5 K/uL   RBC 3.94  3.87 - 5.11 MIL/uL   Hemoglobin 12.9  12.0 - 15.0 g/dL   HCT 38.6  36.0 - 46.0 %   MCV 98.0  78.0 - 100.0 fL   MCH 32.7  26.0 - 34.0 pg   MCHC 33.4  30.0 - 36.0 g/dL   RDW 15.4  11.5 - 15.5 %   Platelets 238  150 - 400 K/uL   No results found.  Review of Systems  Constitutional: Positive for weight loss. Negative for fever.  Respiratory: Negative for shortness of breath.   Cardiovascular: Negative for chest pain.  Gastrointestinal: Positive for abdominal pain. Negative for nausea and vomiting.  Musculoskeletal: Positive for back pain.  Neurological: Positive for tremors and weakness. Negative  for dizziness.  Psychiatric/Behavioral: Negative for substance abuse.    Blood pressure 140/46, pulse 95, temperature 98.4 F (36.9 C), temperature source Oral, resp. rate 18, height 4\' 8"  (1.422 m), weight 49.896 kg (110 lb), SpO2 100.00%. Physical Exam  Constitutional: She is oriented to person, place, and time.  Thin/frail  Cardiovascular: Normal rate and regular rhythm.   Murmur heard. Respiratory: Effort normal and breath sounds normal. She has no wheezes.  GI: Soft. Bowel sounds are normal. There is no tenderness.  Musculoskeletal: Normal range of motion.  Neurological: She is alert and oriented to person, place, and time.  Skin: Skin is warm and dry.  Psychiatric: She has a normal mood and affect. Her behavior is normal. Judgment and thought content normal.     Assessment/Plan Back pain x 2 mo 3 falls in 2 mo MRI reveals acute T11 fx Scheduled for T11 KP/VP Pt and family aware of procedure benefits and risks and agreeable to proceed Consent signed and in chart  Shayanna Thatch A 06/15/2013, 11:27 AM

## 2013-06-30 ENCOUNTER — Other Ambulatory Visit (HOSPITAL_COMMUNITY): Payer: Self-pay | Admitting: Interventional Radiology

## 2013-06-30 DIAGNOSIS — IMO0002 Reserved for concepts with insufficient information to code with codable children: Secondary | ICD-10-CM

## 2013-06-30 DIAGNOSIS — M549 Dorsalgia, unspecified: Secondary | ICD-10-CM

## 2013-07-06 ENCOUNTER — Ambulatory Visit (HOSPITAL_COMMUNITY): Admission: RE | Admit: 2013-07-06 | Payer: Medicare Other | Source: Ambulatory Visit

## 2013-07-08 ENCOUNTER — Other Ambulatory Visit: Payer: Self-pay | Admitting: Geriatric Medicine

## 2013-07-08 DIAGNOSIS — M545 Low back pain, unspecified: Secondary | ICD-10-CM

## 2013-07-13 ENCOUNTER — Ambulatory Visit (HOSPITAL_COMMUNITY): Admission: RE | Admit: 2013-07-13 | Payer: Medicare Other | Source: Ambulatory Visit

## 2013-07-20 ENCOUNTER — Other Ambulatory Visit: Payer: Medicare Other

## 2013-07-22 ENCOUNTER — Other Ambulatory Visit: Payer: Medicare Other

## 2013-08-18 ENCOUNTER — Telehealth: Payer: Self-pay | Admitting: Gastroenterology

## 2013-08-18 NOTE — Telephone Encounter (Signed)
Requesting pt be seen to see if she may have a biliary fistula. MD from nursing facility requesting visit. Left message for Crystal at [facility] [phone] to call back.  Pt scheduled to see Alonza Bogus PA tomorrow at 10:30am. Crystal to fax records on pt and notify pts son of appt.

## 2013-08-19 ENCOUNTER — Encounter: Payer: Self-pay | Admitting: Gastroenterology

## 2013-08-19 ENCOUNTER — Other Ambulatory Visit (INDEPENDENT_AMBULATORY_CARE_PROVIDER_SITE_OTHER): Payer: Medicare Other

## 2013-08-19 ENCOUNTER — Ambulatory Visit (INDEPENDENT_AMBULATORY_CARE_PROVIDER_SITE_OTHER): Payer: Medicare Other | Admitting: Gastroenterology

## 2013-08-19 VITALS — BP 94/60 | HR 70 | Ht <= 58 in | Wt 134.0 lb

## 2013-08-19 DIAGNOSIS — R945 Abnormal results of liver function studies: Principal | ICD-10-CM

## 2013-08-19 DIAGNOSIS — R7989 Other specified abnormal findings of blood chemistry: Secondary | ICD-10-CM | POA: Insufficient documentation

## 2013-08-19 DIAGNOSIS — R932 Abnormal findings on diagnostic imaging of liver and biliary tract: Secondary | ICD-10-CM

## 2013-08-19 DIAGNOSIS — I2510 Atherosclerotic heart disease of native coronary artery without angina pectoris: Secondary | ICD-10-CM

## 2013-08-19 LAB — HEPATIC FUNCTION PANEL
ALBUMIN: 3 g/dL — AB (ref 3.5–5.2)
ALK PHOS: 128 U/L — AB (ref 39–117)
ALT: 17 U/L (ref 0–35)
AST: 28 U/L (ref 0–37)
Bilirubin, Direct: 0.1 mg/dL (ref 0.0–0.3)
TOTAL PROTEIN: 6.7 g/dL (ref 6.0–8.3)
Total Bilirubin: 0.5 mg/dL (ref 0.2–1.2)

## 2013-08-19 NOTE — Progress Notes (Signed)
08/19/2013 [name] [id number] Sep 29, 1915   HISTORY OF PRESENT ILLNESS:  This is a pleasant 80 year old female who had previously been seen by Dr. Deatra Ina in the past. She's been sent to our office today by her PCP, Dr. Fredderick Phenix, for evaluation regarding elevated liver tests and abnormal biliary imaging. In March of this year she had a CT scan of the abdomen and pelvis performed with contrast due to complaints of abdominal pain. CT scan revealed biliary ductal dilatation which may be due to stone or stricture as well as evidence of chronic calcific pancreatitis with no evidence of mass lesions.  This was followed by an ultrasound of the abdomen, which revealed biliary ductal dilatation with common bile duct measuring up to 14 mm; it could be due to stone or stricture or mass and MRCP was recommended. MRCP was performed and revealed new and progressive biliary ductal dilatation as compared to study in 2011 without evidence of obstructive stone or mass; it was stated that this could be within normal variation for this age group, but periampullary stricture or or otherwise occult ampullary lesion cannot be excluded. It also showed findings of chronic pancreatitis with decreased size of the pseudocyst within the uncinate process.  CA 19-9 is not elevated.  LFTs in March were normal except for a mildly elevated alkaline phosphatase at 144. We have repeat LFTs from mid-April, which showed the alkaline phosphatase has increased further to 201, the all other LFTs remained within normal limits. She tells me that she does get intermittent abdominal pains, but has no pain currently.   Past Medical History  Diagnosis Date  . Arthritis   . Coronary artery disease   . GERD (gastroesophageal reflux disease)    Past Surgical History  Procedure Laterality Date  . Hip fracture surgery      x 3  . Orif femur fracture Left 12/13/2012    Procedure: OPEN REDUCTION INTERNAL FIXATION (ORIF) DISTAL FEMUR  FRACTURE;  Surgeon: Mauri Pole, MD;  Location: [facility];  Service: Orthopedics;  Laterality: Left;  . Appendectomy    . Cholecystectomy      reports that she has never smoked. She has never used smokeless tobacco. She reports that she does not drink alcohol or use illicit drugs. family history is not on file. Allergies  Allergen Reactions  . Penicillins Other (See Comments)    Childhood allergy; reaction unknown      Outpatient Encounter Prescriptions as of 08/19/2013  Medication Sig  . acetaminophen (TYLENOL) 325 MG tablet Take 2 tablets (650 mg total) by mouth every 6 (six) hours as needed for mild pain or fever (or Fever >/= 101).  . collagenase (SANTYL) ointment Apply topically daily. To sacral wound daily  . diazepam (VALIUM) 2 MG tablet Take 1 tablet (2 mg total) by mouth at bedtime as needed for anxiety (sleep).  . docusate sodium (COLACE) 100 MG capsule Take 100 mg by mouth daily.  Marland Kitchen doxycycline (VIBRA-TABS) 100 MG tablet Take 100 mg by mouth 2 (two) times daily. For 14 days  . feeding supplement, ENSURE COMPLETE, (ENSURE COMPLETE) LIQD Take 237 mLs by mouth 3 (three) times daily between meals.  . magnesium oxide (MAG-OX) 400 MG tablet Take 400 mg by mouth daily.  . Multiple Vitamin (MULTIVITAMIN WITH MINERALS) TABS Take 1 tablet by mouth every morning.   Marland Kitchen omega-3 acid ethyl esters (LOVAZA) 1 G capsule Take 1 capsule (1 g total) by mouth daily.  Marland Kitchen omeprazole (PRILOSEC) 40 MG  capsule Take 1 capsule (40 mg total) by mouth every morning.  Marland Kitchen oxyCODONE-acetaminophen (PERCOCET/ROXICET) 5-325 MG per tablet Take 1 tablet by mouth every 4 (four) hours as needed for severe pain.  . polyethylene glycol (MIRALAX / GLYCOLAX) packet Take 17 g by mouth daily.  . potassium chloride (K-DUR) 10 MEQ tablet Take 1 tablet (10 mEq total) by mouth every morning.     REVIEW OF SYSTEMS  : All other systems reviewed and negative except where noted in the History of Present Illness.   PHYSICAL  EXAM: BP 94/60  Pulse 70  Ht 4\' 8"  (1.422 m)  Wt 134 lb (60.782 kg)  BMI 30.06 kg/m2 General: Elderly white female in no acute distress Head: Normocephalic and atraumatic Eyes:  Sclerae anicteric, conjunctiva pink. Ears: Normal auditory acuity Lungs: Clear throughout to auscultation Heart: Regular rate and rhythm Abdomen: Soft, non-distended.  Normal bowel sounds.  Minimal TTP in RUQ without R/R/G. Musculoskeletal: Symmetrical with no gross deformities  Skin: No lesions on visible extremities Extremities:  Some edema in B/L LE's. Neurological: Alert oriented x 4, grossly non-focal Psychological:  Alert and cooperative. Normal mood and affect  ASSESSMENT AND PLAN: -Elevated ALP with biliary dilation on several recent imaging studies.  Discussed with Dr. Deatra Ina.  Will repeat LFT's today.  Patient has intermittent abdominal pains, but states to me today that the only reason she would be interested in ERCP would be if her liver tests are elevated enough that would indicate to [place] that the procedure should be performed and is necessary.  Information can be discussed with her son, [name], at [phone].  He was not present at the visit today.

## 2013-08-19 NOTE — Progress Notes (Signed)
I suspect that the patient has a retained bile duct stone on the basis of fluctuating liver tests, abdominal pain and marked dilatation of the biliary system.  Because of her advanced age she is at high risk for any interventional procedure.  Will discuss this with her son.  If we learn that she has had recurrent abdominal pain then I would be more inclined to do an ERCP.  If symptoms have been mild  I would treat her expectantly and not do an ERCP. Should she develop frank abdominal pain suggestive of biliary pain I would be more inclined to do an ERCP.

## 2013-08-19 NOTE — Patient Instructions (Signed)
Your physician has requested that you go to the basement for the following lab work before leaving today: Liver Function Test

## 2013-08-20 ENCOUNTER — Telehealth: Payer: Self-pay | Admitting: Gastroenterology

## 2013-08-20 NOTE — Telephone Encounter (Signed)
No answer.  I left a message for him to call back so we can discuss his mother's medical condition and problems.

## 2013-08-20 NOTE — Telephone Encounter (Signed)
I explained to the patient's son that I believe his mother has a bile duct stone that is intermittently blocking the bile duct.  That would account for dilatation of the biliary system and abnormal liver tests, which are now normal.  Currently she is asymptomatic and liver tests are normal.  I discussed options including expectant management, and ERCP with sphincterotomy.  I explained that the patient has a higher risk for any procedure given her advanced age.  At the same time, should she develop obstruction she is at risk for cholangitis and pancreatitis.  We decided to treat her expectantly.  I carefully instructed him to contact us if she develops fever, jaundice or abdominal pain.

## 2013-08-25 ENCOUNTER — Other Ambulatory Visit: Payer: Self-pay | Admitting: Geriatric Medicine

## 2013-08-25 DIAGNOSIS — S31109A Unspecified open wound of abdominal wall, unspecified quadrant without penetration into peritoneal cavity, initial encounter: Secondary | ICD-10-CM

## 2013-08-26 ENCOUNTER — Encounter (HOSPITAL_BASED_OUTPATIENT_CLINIC_OR_DEPARTMENT_OTHER): Payer: Medicare Other | Attending: Internal Medicine

## 2013-08-26 DIAGNOSIS — L8994 Pressure ulcer of unspecified site, stage 4: Secondary | ICD-10-CM | POA: Insufficient documentation

## 2013-08-26 DIAGNOSIS — M810 Age-related osteoporosis without current pathological fracture: Secondary | ICD-10-CM | POA: Insufficient documentation

## 2013-08-26 DIAGNOSIS — Z79899 Other long term (current) drug therapy: Secondary | ICD-10-CM | POA: Insufficient documentation

## 2013-08-26 DIAGNOSIS — I252 Old myocardial infarction: Secondary | ICD-10-CM | POA: Insufficient documentation

## 2013-08-26 DIAGNOSIS — L89309 Pressure ulcer of unspecified buttock, unspecified stage: Secondary | ICD-10-CM | POA: Insufficient documentation

## 2013-08-27 NOTE — Progress Notes (Signed)
[facility]  NAME:  [name], [name]              ACCOUNT NO.:  [id number]  MEDICAL RECORD NO.:  [id number]      DATE OF BIRTH:  Mar 06, 1916  PHYSICIAN:  Ricard Dillon, M.D. VISIT DATE:  08/26/2013                                  OFFICE VISIT   HISTORY OF PRESENT ILLNESS:  [name] is a patient who comes to [place] courtesy of Dr. Fredderick Phenix, her primary physician.  She is here for a wound on her left buttock.  She is accompanied by her son, who helped to provide some of the history.  Apparently, this patient fell in October 2014, suffering a femur fracture.  Indeed on [facility], she underwent surgery on December 13, 2012, by Dr. Alvan Dame for open reduction and internal fixation of a left distal femur fracture.  Postoperatively, she was discharged to a skilled facility.  Her son feels that this wound started sometime in this time frame.  She returned after discharge to [facility], however, her care needs were too high. She has since been admitted since February to [facility].  With regards to her wound, her son states that this has been getting worse.  She has [facility], and they have been applying an ointment as far as she is aware twice a week.  She has an air mattress and a hospital bed, however, it seems that she is spending an enormous amount of time in a lift chair in a reclined position.  Other than the hospital bed, she is not paying any particular attention to pressure relief.  As mentioned, I am not completely certain about current wound dressing orders.  PAST MEDICAL HISTORY: 1. MI 40 years ago. 2. History of pneumonia. 3. Lumbar fractures. 4. Osteoporosis.  PAST SURGICAL HISTORY:  She has had a hip replacement on the right in 2007, on the left in 1999; history of a cholecystectomy, appendectomy, bilateral vein stripping, cataract surgery.  CURRENT MEDICATIONS: 1. Ensure 3 times daily. 2. Lovaza 1 g  daily. 3. MiraLax 17 g daily. 4. Acetaminophen 325 every 6 hours p.r.n. 5. Colace 100 b.i.d. 6. Doxycycline 100 b.i.d. 7. Magnesium oxide 400 daily. 8. Prilosec 40 daily. 9. Percocet 5/325 every 4 hours p.r.n. 10.K-Dur 10 mEq p.r.n.  PHYSICAL EXAMINATION:  VITAL SIGNS:  Temperature is 97.6, pulse 85, respirations 18, blood pressure 119/70.  GENERAL:  Frail, but alert woman who can participate in her history. RESPIRATORY:  There are crackles over the right lower lobe.  CARDIAC:  Normal.  WOUND EXAMINATION:  The area is in her upper left buttock just inferior to the coccyx area.  The orifice here was fairly small measuring 0.9 x 0.6 x 3.1, however, this tunnels widely especially superiorly.  With my fifth finger, I think this probes to the coccyx which would make this a stage IV wound.  There was also an amount of retained dressing in the superior aspect and this was removed.  There was some liquified green material which I cultured for C and S.  I did not provide her with empiric antibiotics.  IMPRESSION/PLAN:  Stage IV pressure ulcer as described.  Unfortunately, this is a small opening that probes widely especially superiorly.  I think she has a palpable coccyx at the superior aspect  of this wound.  A culture was done.  We will use silver alginate packing to this.  I spent an extensive amount of time talking to her about pressure relief which is really most important.  Lab work was ordered including a comprehensive metabolic panel, CBC and differential, sedimentation rate, and C-reactive protein.  As I understand things, she has a CT scan with contrast ordered next Tuesday by her primary physician, Dr. Fredderick Phenix, presumably to establish whether there is underlying osteomyelitis here. I agree with this thought and we will await the imaging studies when we see her next week.  My overall thought is that the orifice of this is probably too small to wound VAC.  If we are going to  consider negative pressure therapy, then I think this entire wound area might need to be opened.  I would ask for the assistance of our plastic surgeons if we entertain this thought.  If she has underlying osteomyelitis, she is going to need IV antibiotics, and I doubt this could be managed at the assisted living level of care. However, we will cross this bridge when we come to it.  We will see her again in a week's time.  Orders were faxed to [facility].          ______________________________ Ricard Dillon, M.D.     MGR/MEDQ  D:  08/26/2013  T:  08/27/2013  Job:  [id number]

## 2013-08-31 ENCOUNTER — Ambulatory Visit
Admission: RE | Admit: 2013-08-31 | Discharge: 2013-08-31 | Disposition: A | Discharge status: Home / Self Care | Payer: Medicare Other | Source: Ambulatory Visit | Attending: Geriatric Medicine | Admitting: Geriatric Medicine

## 2013-08-31 ENCOUNTER — Other Ambulatory Visit: Payer: Self-pay | Admitting: Geriatric Medicine

## 2013-08-31 DIAGNOSIS — S31109A Unspecified open wound of abdominal wall, unspecified quadrant without penetration into peritoneal cavity, initial encounter: Secondary | ICD-10-CM

## 2013-09-16 ENCOUNTER — Encounter (HOSPITAL_BASED_OUTPATIENT_CLINIC_OR_DEPARTMENT_OTHER): Payer: Medicare Other | Attending: Internal Medicine

## 2013-09-16 DIAGNOSIS — L8994 Pressure ulcer of unspecified site, stage 4: Secondary | ICD-10-CM | POA: Diagnosis not present

## 2013-09-16 DIAGNOSIS — L89309 Pressure ulcer of unspecified buttock, unspecified stage: Secondary | ICD-10-CM | POA: Insufficient documentation

## 2013-09-23 DIAGNOSIS — L89309 Pressure ulcer of unspecified buttock, unspecified stage: Secondary | ICD-10-CM | POA: Diagnosis not present

## 2013-09-23 DIAGNOSIS — L8994 Pressure ulcer of unspecified site, stage 4: Secondary | ICD-10-CM | POA: Diagnosis not present

## 2013-09-28 ENCOUNTER — Encounter (HOSPITAL_BASED_OUTPATIENT_CLINIC_OR_DEPARTMENT_OTHER): Payer: Medicare Other

## 2013-10-01 ENCOUNTER — Encounter (HOSPITAL_COMMUNITY): Payer: Self-pay | Admitting: Emergency Medicine

## 2013-10-01 ENCOUNTER — Emergency Department (HOSPITAL_COMMUNITY): Payer: Medicare Other

## 2013-10-01 ENCOUNTER — Inpatient Hospital Stay (HOSPITAL_COMMUNITY)
Admission: EM | Admit: 2013-10-01 | Discharge: 2013-10-04 | DRG: 562 | Disposition: A | Discharge status: Skilled Nursing Facility | Payer: Medicare Other | Attending: Internal Medicine | Admitting: Internal Medicine

## 2013-10-01 DIAGNOSIS — Z860100 Personal history of colon polyps, unspecified: Secondary | ICD-10-CM

## 2013-10-01 DIAGNOSIS — M810 Age-related osteoporosis without current pathological fracture: Secondary | ICD-10-CM | POA: Diagnosis present

## 2013-10-01 DIAGNOSIS — D638 Anemia in other chronic diseases classified elsewhere: Secondary | ICD-10-CM | POA: Diagnosis present

## 2013-10-01 DIAGNOSIS — W010XXA Fall on same level from slipping, tripping and stumbling without subsequent striking against object, initial encounter: Secondary | ICD-10-CM | POA: Diagnosis present

## 2013-10-01 DIAGNOSIS — R197 Diarrhea, unspecified: Secondary | ICD-10-CM | POA: Diagnosis present

## 2013-10-01 DIAGNOSIS — D649 Anemia, unspecified: Secondary | ICD-10-CM | POA: Diagnosis present

## 2013-10-01 DIAGNOSIS — K589 Irritable bowel syndrome without diarrhea: Secondary | ICD-10-CM

## 2013-10-01 DIAGNOSIS — M129 Arthropathy, unspecified: Secondary | ICD-10-CM | POA: Diagnosis present

## 2013-10-01 DIAGNOSIS — I2510 Atherosclerotic heart disease of native coronary artery without angina pectoris: Secondary | ICD-10-CM | POA: Diagnosis present

## 2013-10-01 DIAGNOSIS — Z6830 Body mass index (BMI) 30.0-30.9, adult: Secondary | ICD-10-CM

## 2013-10-01 DIAGNOSIS — Z66 Do not resuscitate: Secondary | ICD-10-CM | POA: Diagnosis present

## 2013-10-01 DIAGNOSIS — D126 Benign neoplasm of colon, unspecified: Secondary | ICD-10-CM

## 2013-10-01 DIAGNOSIS — D72829 Elevated white blood cell count, unspecified: Secondary | ICD-10-CM | POA: Diagnosis present

## 2013-10-01 DIAGNOSIS — Z880 Allergy status to penicillin: Secondary | ICD-10-CM | POA: Diagnosis not present

## 2013-10-01 DIAGNOSIS — D696 Thrombocytopenia, unspecified: Secondary | ICD-10-CM | POA: Diagnosis present

## 2013-10-01 DIAGNOSIS — L8994 Pressure ulcer of unspecified site, stage 4: Secondary | ICD-10-CM

## 2013-10-01 DIAGNOSIS — Z79899 Other long term (current) drug therapy: Secondary | ICD-10-CM

## 2013-10-01 DIAGNOSIS — E871 Hypo-osmolality and hyponatremia: Secondary | ICD-10-CM | POA: Diagnosis present

## 2013-10-01 DIAGNOSIS — E860 Dehydration: Secondary | ICD-10-CM

## 2013-10-01 DIAGNOSIS — I447 Left bundle-branch block, unspecified: Secondary | ICD-10-CM

## 2013-10-01 DIAGNOSIS — R933 Abnormal findings on diagnostic imaging of other parts of digestive tract: Secondary | ICD-10-CM

## 2013-10-01 DIAGNOSIS — J9819 Other pulmonary collapse: Secondary | ICD-10-CM | POA: Diagnosis present

## 2013-10-01 DIAGNOSIS — R5383 Other fatigue: Secondary | ICD-10-CM

## 2013-10-01 DIAGNOSIS — IMO0002 Reserved for concepts with insufficient information to code with codable children: Secondary | ICD-10-CM | POA: Diagnosis present

## 2013-10-01 DIAGNOSIS — K838 Other specified diseases of biliary tract: Secondary | ICD-10-CM

## 2013-10-01 DIAGNOSIS — S82141A Displaced bicondylar fracture of right tibia, initial encounter for closed fracture: Secondary | ICD-10-CM

## 2013-10-01 DIAGNOSIS — K219 Gastro-esophageal reflux disease without esophagitis: Secondary | ICD-10-CM | POA: Diagnosis present

## 2013-10-01 DIAGNOSIS — R932 Abnormal findings on diagnostic imaging of liver and biliary tract: Secondary | ICD-10-CM

## 2013-10-01 DIAGNOSIS — K5900 Constipation, unspecified: Secondary | ICD-10-CM | POA: Diagnosis present

## 2013-10-01 DIAGNOSIS — R110 Nausea: Secondary | ICD-10-CM | POA: Diagnosis present

## 2013-10-01 DIAGNOSIS — S82143A Displaced bicondylar fracture of unspecified tibia, initial encounter for closed fracture: Secondary | ICD-10-CM

## 2013-10-01 DIAGNOSIS — N952 Postmenopausal atrophic vaginitis: Secondary | ICD-10-CM | POA: Diagnosis present

## 2013-10-01 DIAGNOSIS — E039 Hypothyroidism, unspecified: Secondary | ICD-10-CM | POA: Diagnosis present

## 2013-10-01 DIAGNOSIS — E663 Overweight: Secondary | ICD-10-CM

## 2013-10-01 DIAGNOSIS — I219 Acute myocardial infarction, unspecified: Secondary | ICD-10-CM

## 2013-10-01 DIAGNOSIS — F329 Major depressive disorder, single episode, unspecified: Secondary | ICD-10-CM

## 2013-10-01 DIAGNOSIS — S32509A Unspecified fracture of unspecified pubis, initial encounter for closed fracture: Secondary | ICD-10-CM | POA: Diagnosis present

## 2013-10-01 DIAGNOSIS — F32A Depression, unspecified: Secondary | ICD-10-CM

## 2013-10-01 DIAGNOSIS — Z833 Family history of diabetes mellitus: Secondary | ICD-10-CM | POA: Diagnosis not present

## 2013-10-01 DIAGNOSIS — I959 Hypotension, unspecified: Secondary | ICD-10-CM | POA: Diagnosis present

## 2013-10-01 DIAGNOSIS — R945 Abnormal results of liver function studies: Secondary | ICD-10-CM

## 2013-10-01 DIAGNOSIS — R5381 Other malaise: Secondary | ICD-10-CM | POA: Diagnosis present

## 2013-10-01 DIAGNOSIS — D500 Iron deficiency anemia secondary to blood loss (chronic): Secondary | ICD-10-CM

## 2013-10-01 DIAGNOSIS — K5909 Other constipation: Secondary | ICD-10-CM

## 2013-10-01 DIAGNOSIS — L8992 Pressure ulcer of unspecified site, stage 2: Secondary | ICD-10-CM

## 2013-10-01 DIAGNOSIS — Y93E1 Activity, personal bathing and showering: Secondary | ICD-10-CM | POA: Diagnosis not present

## 2013-10-01 DIAGNOSIS — E875 Hyperkalemia: Secondary | ICD-10-CM

## 2013-10-01 DIAGNOSIS — K831 Obstruction of bile duct: Secondary | ICD-10-CM

## 2013-10-01 DIAGNOSIS — L89309 Pressure ulcer of unspecified buttock, unspecified stage: Secondary | ICD-10-CM | POA: Diagnosis present

## 2013-10-01 DIAGNOSIS — R109 Unspecified abdominal pain: Secondary | ICD-10-CM

## 2013-10-01 DIAGNOSIS — M25569 Pain in unspecified knee: Secondary | ICD-10-CM | POA: Diagnosis present

## 2013-10-01 DIAGNOSIS — E43 Unspecified severe protein-calorie malnutrition: Secondary | ICD-10-CM | POA: Diagnosis present

## 2013-10-01 DIAGNOSIS — K921 Melena: Secondary | ICD-10-CM

## 2013-10-01 DIAGNOSIS — J9600 Acute respiratory failure, unspecified whether with hypoxia or hypercapnia: Secondary | ICD-10-CM | POA: Diagnosis present

## 2013-10-01 DIAGNOSIS — R7989 Other specified abnormal findings of blood chemistry: Secondary | ICD-10-CM | POA: Diagnosis present

## 2013-10-01 DIAGNOSIS — S82109A Unspecified fracture of upper end of unspecified tibia, initial encounter for closed fracture: Principal | ICD-10-CM | POA: Diagnosis present

## 2013-10-01 DIAGNOSIS — S32591A Other specified fracture of right pubis, initial encounter for closed fracture: Secondary | ICD-10-CM

## 2013-10-01 DIAGNOSIS — W19XXXA Unspecified fall, initial encounter: Secondary | ICD-10-CM

## 2013-10-01 DIAGNOSIS — J9811 Atelectasis: Secondary | ICD-10-CM

## 2013-10-01 DIAGNOSIS — W19XXXD Unspecified fall, subsequent encounter: Secondary | ICD-10-CM

## 2013-10-01 DIAGNOSIS — K5730 Diverticulosis of large intestine without perforation or abscess without bleeding: Secondary | ICD-10-CM

## 2013-10-01 DIAGNOSIS — S82131A Displaced fracture of medial condyle of right tibia, initial encounter for closed fracture: Secondary | ICD-10-CM

## 2013-10-01 LAB — CBC WITH DIFFERENTIAL/PLATELET
BASOS ABS: 0 10*3/uL (ref 0.0–0.1)
Basophils Relative: 0 % (ref 0–1)
EOS PCT: 0 % (ref 0–5)
Eosinophils Absolute: 0 10*3/uL (ref 0.0–0.7)
HCT: 32.4 % — ABNORMAL LOW (ref 36.0–46.0)
Hemoglobin: 11 g/dL — ABNORMAL LOW (ref 12.0–15.0)
LYMPHS PCT: 6 % — AB (ref 12–46)
Lymphs Abs: 1.2 10*3/uL (ref 0.7–4.0)
MCH: 33.6 pg (ref 26.0–34.0)
MCHC: 34 g/dL (ref 30.0–36.0)
MCV: 99.1 fL (ref 78.0–100.0)
Monocytes Absolute: 1.5 10*3/uL — ABNORMAL HIGH (ref 0.1–1.0)
Monocytes Relative: 7 % (ref 3–12)
Neutro Abs: 18.5 10*3/uL — ABNORMAL HIGH (ref 1.7–7.7)
Neutrophils Relative %: 87 % — ABNORMAL HIGH (ref 43–77)
PLATELETS: 115 10*3/uL — AB (ref 150–400)
RBC: 3.27 MIL/uL — ABNORMAL LOW (ref 3.87–5.11)
RDW: 14.7 % (ref 11.5–15.5)
WBC: 21.2 10*3/uL — AB (ref 4.0–10.5)

## 2013-10-01 LAB — BASIC METABOLIC PANEL
Anion gap: 13 (ref 5–15)
BUN: 30 mg/dL — ABNORMAL HIGH (ref 6–23)
CALCIUM: 9.1 mg/dL (ref 8.4–10.5)
CO2: 26 mEq/L (ref 19–32)
Chloride: 95 mEq/L — ABNORMAL LOW (ref 96–112)
Creatinine, Ser: 0.97 mg/dL (ref 0.50–1.10)
GFR calc Af Amer: 56 mL/min — ABNORMAL LOW (ref >90)
GFR calc non Af Amer: 48 mL/min — ABNORMAL LOW (ref >90)
Glucose, Bld: 143 mg/dL — ABNORMAL HIGH (ref 70–99)
Potassium: 3.9 mEq/L (ref 3.7–5.3)
Sodium: 134 mEq/L — ABNORMAL LOW (ref 137–147)

## 2013-10-01 LAB — TROPONIN I
Troponin I: <0.3 ng/mL (ref <0.30)
Troponin I: <0.3 ng/mL (ref <0.30)

## 2013-10-01 LAB — URINALYSIS, ROUTINE W REFLEX MICROSCOPIC
Bilirubin Urine: NEGATIVE
GLUCOSE, UA: NEGATIVE mg/dL
Ketones, ur: NEGATIVE mg/dL
Nitrite: NEGATIVE
PROTEIN: NEGATIVE mg/dL
Specific Gravity, Urine: 1.02 (ref 1.005–1.030)
Urobilinogen, UA: 1 mg/dL (ref 0.0–1.0)
pH: 5 (ref 5.0–8.0)

## 2013-10-01 LAB — I-STAT TROPONIN, ED: Troponin i, poc: 0.13 ng/mL (ref 0.00–0.08)

## 2013-10-01 LAB — TSH: TSH: 2.09 u[IU]/mL (ref 0.350–4.500)

## 2013-10-01 LAB — MRSA PCR SCREENING: MRSA BY PCR: NEGATIVE

## 2013-10-01 LAB — URINE MICROSCOPIC-ADD ON

## 2013-10-01 MED ORDER — CLONAZEPAM 0.5 MG PO TABS
0.5000 mg | ORAL_TABLET | Freq: Every day | ORAL | Status: DC
  Administered 2013-10-01 – 2013-10-03 (×3): 0.5 mg via ORAL
  Filled 2013-10-01 (×3): qty 1

## 2013-10-01 MED ORDER — ASPIRIN 81 MG PO CHEW
324.0000 mg | CHEWABLE_TABLET | Freq: Once | ORAL | Status: AC
  Administered 2013-10-01: 324 mg via ORAL
  Filled 2013-10-01: qty 4

## 2013-10-01 MED ORDER — ONDANSETRON HCL 4 MG PO TABS: 4.0000 mg | ORAL_TABLET | Freq: Four times a day (QID) | ORAL | Status: DC | PRN

## 2013-10-01 MED ORDER — POLYETHYLENE GLYCOL 3350 17 G PO PACK
17.0000 g | PACK | Freq: Every day | ORAL | Status: DC | PRN
  Administered 2013-10-03: 17 g via ORAL
  Filled 2013-10-01: qty 1

## 2013-10-01 MED ORDER — ACETAMINOPHEN 325 MG PO TABS: 650.0000 mg | ORAL_TABLET | Freq: Four times a day (QID) | ORAL | Status: DC | PRN

## 2013-10-01 MED ORDER — OXYCODONE-ACETAMINOPHEN 5-325 MG PO TABS
1.0000 | ORAL_TABLET | Freq: Four times a day (QID) | ORAL | Status: DC | PRN
  Administered 2013-10-01 – 2013-10-02 (×3): 1 via ORAL
  Filled 2013-10-01 (×4): qty 1

## 2013-10-01 MED ORDER — LEVOFLOXACIN 250 MG PO TABS
250.0000 mg | ORAL_TABLET | Freq: Every day | ORAL | Status: DC
  Administered 2013-10-02 – 2013-10-04 (×3): 250 mg via ORAL
  Filled 2013-10-01 (×3): qty 1

## 2013-10-01 MED ORDER — MUSCLE RUB 10-15 % EX CREA
TOPICAL_CREAM | CUTANEOUS | Status: DC | PRN
  Filled 2013-10-01: qty 85

## 2013-10-01 MED ORDER — LEVOTHYROXINE SODIUM 25 MCG PO TABS
25.0000 ug | ORAL_TABLET | Freq: Every day | ORAL | Status: DC
  Administered 2013-10-02 – 2013-10-04 (×3): 25 ug via ORAL
  Filled 2013-10-01 (×4): qty 1

## 2013-10-01 MED ORDER — PANTOPRAZOLE SODIUM 40 MG PO TBEC
40.0000 mg | DELAYED_RELEASE_TABLET | Freq: Every day | ORAL | Status: DC
  Administered 2013-10-02 – 2013-10-04 (×3): 40 mg via ORAL
  Filled 2013-10-01 (×3): qty 1

## 2013-10-01 MED ORDER — ONDANSETRON HCL 4 MG/2ML IJ SOLN: 4.0000 mg | Freq: Four times a day (QID) | INTRAMUSCULAR | Status: DC | PRN

## 2013-10-01 MED ORDER — ASPIRIN EC 81 MG PO TBEC
81.0000 mg | DELAYED_RELEASE_TABLET | Freq: Every day | ORAL | Status: DC
  Administered 2013-10-01 – 2013-10-04 (×4): 81 mg via ORAL
  Filled 2013-10-01 (×4): qty 1

## 2013-10-01 MED ORDER — MORPHINE SULFATE 2 MG/ML IJ SOLN
1.0000 mg | INTRAMUSCULAR | Status: DC | PRN
  Administered 2013-10-01 – 2013-10-02 (×3): 1 mg via INTRAVENOUS
  Filled 2013-10-01 (×3): qty 1

## 2013-10-01 MED ORDER — FENTANYL CITRATE 0.05 MG/ML IJ SOLN: 50.0000 ug | INTRAMUSCULAR | Status: DC | PRN

## 2013-10-01 MED ORDER — HYDROCORTISONE 0.5 % EX CREA: 1.0000 "application " | TOPICAL_CREAM | CUTANEOUS | Status: DC | PRN

## 2013-10-01 MED ORDER — MENTHOL (TOPICAL ANALGESIC) 2.5 % EX GEL: 1.0000 "application " | CUTANEOUS | Status: DC | PRN

## 2013-10-01 MED ORDER — ACETAMINOPHEN 650 MG RE SUPP: 650.0000 mg | Freq: Four times a day (QID) | RECTAL | Status: DC | PRN

## 2013-10-01 MED ORDER — POTASSIUM CHLORIDE ER 10 MEQ PO TBCR
10.0000 meq | EXTENDED_RELEASE_TABLET | Freq: Every morning | ORAL | Status: DC
  Administered 2013-10-02 – 2013-10-04 (×3): 10 meq via ORAL
  Filled 2013-10-01 (×3): qty 1

## 2013-10-01 MED ORDER — OCUSOFT LID SCRUB EX PADS: 1.0000 | MEDICATED_PAD | Freq: Two times a day (BID) | CUTANEOUS | Status: DC

## 2013-10-01 MED ORDER — SODIUM CHLORIDE 0.9 % IJ SOLN
3.0000 mL | Freq: Two times a day (BID) | INTRAMUSCULAR | Status: DC
  Administered 2013-10-01 – 2013-10-04 (×5): 3 mL via INTRAVENOUS

## 2013-10-01 MED ORDER — OCUVITE-LUTEIN PO CAPS
1.0000 | ORAL_CAPSULE | Freq: Two times a day (BID) | ORAL | Status: DC
  Administered 2013-10-01 – 2013-10-03 (×5): 1 via ORAL
  Filled 2013-10-01 (×7): qty 1

## 2013-10-01 MED ORDER — HYPROMELLOSE (GONIOSCOPIC) 2.5 % OP SOLN: 1.0000 [drp] | Freq: Two times a day (BID) | OPHTHALMIC | Status: DC

## 2013-10-01 MED ORDER — CHOLESTYRAMINE 4 G PO PACK
4.0000 g | PACK | Freq: Every day | ORAL | Status: DC | PRN
  Filled 2013-10-01: qty 1

## 2013-10-01 MED ORDER — ENSURE COMPLETE PO LIQD
237.0000 mL | Freq: Three times a day (TID) | ORAL | Status: DC
  Administered 2013-10-01 – 2013-10-04 (×7): 237 mL via ORAL

## 2013-10-01 MED ORDER — SODIUM CHLORIDE 0.9 % IV SOLN
INTRAVENOUS | Status: AC
  Administered 2013-10-01 – 2013-10-02 (×2): via INTRAVENOUS

## 2013-10-01 MED ORDER — FENTANYL CITRATE 0.05 MG/ML IJ SOLN: 50.0000 ug | Freq: Once | INTRAMUSCULAR | Status: DC

## 2013-10-01 MED ORDER — FENTANYL CITRATE 0.05 MG/ML IJ SOLN
50.0000 ug | Freq: Once | INTRAMUSCULAR | Status: AC
  Administered 2013-10-01: 50 ug via INTRAVENOUS
  Filled 2013-10-01: qty 2

## 2013-10-01 MED ORDER — ICAPS AREDS FORMULA PO TABS: 1.0000 | ORAL_TABLET | Freq: Two times a day (BID) | ORAL | Status: DC

## 2013-10-01 MED ORDER — POLYVINYL ALCOHOL 1.4 % OP SOLN
1.0000 [drp] | Freq: Two times a day (BID) | OPHTHALMIC | Status: DC
  Administered 2013-10-01 – 2013-10-04 (×5): 1 [drp] via OPHTHALMIC
  Filled 2013-10-01: qty 15

## 2013-10-01 NOTE — Procedures (Signed)
Pre-procedure diagnosis: difficult foley catheterization Post-procedure diagnosis: as above  Procedure performed: placement of complicated foley  Surgeon: Dr. Ardis Hughs  Findings: retracted urethral meatus secondary to vaginal atrophy Specimen: urine culture  Drains: 43Ffoley  Indications:  Patient unable to void on her own.  Nursing staff have been unsuccessful at placing catheter.  Procedure:  Gentials were prepped and draped in the routine sterile fashion.  10cc of 1% viscous lidocaine jelly was then injected into the patient's urethra.  A 43F catheter was then gently passed in to the urethral. Several attempts required due to difficulty located urethral meatus.   Clear yellow urine was returned.  Patient tolerated the procedure well - no immediate issues.  Disposition: Remove catheter once patient is ambulatory and able to void on her own.

## 2013-10-01 NOTE — Consult Note (Signed)
Reason for Consult: Right tibia fracture Referring Physician: Dr. Bradd Burner is an 80 y.o. female.  HPI: [name] is a 80 yo female who slipped and fell getting out of the shower and fell froward landing on her right knee with immediate knee pain and inability to bear weight. She did not hit her head or lose consciousness. Her only complaint is right knee pain. She was taken to the [facility] ED and noted to have a non-displaced proximal tibia fracture. She is admitted to the medical service for pain control and post-discharge disposition. She currently lives in an assisted living environment and independently ambulates.  Past Medical History  Diagnosis Date  . Arthritis   . Coronary artery disease   . GERD (gastroesophageal reflux disease)     Past Surgical History  Procedure Laterality Date  . Hip fracture surgery      x 3  . Orif femur fracture Left 12/13/2012    Procedure: OPEN REDUCTION INTERNAL FIXATION (ORIF) DISTAL FEMUR FRACTURE;  Surgeon: Mauri Pole, MD;  Location: [facility];  Service: Orthopedics;  Laterality: Left;  . Appendectomy    . Cholecystectomy    . Kyphoplasty      Family History  Problem Relation Age of Onset  . Diabetes Maternal Grandmother   . Diabetes Paternal Grandmother     Social History:  reports that she has never smoked. She has never used smokeless tobacco. She reports that she does not drink alcohol or use illicit drugs.  Allergies:  Allergies  Allergen Reactions  . Penicillins Other (See Comments)    Childhood allergy; reaction unknown    Medications: I have reviewed the patient's current medications.  Results for orders placed during the hospital encounter of 10/01/13 (from the past 48 hour(s))  CBC WITH DIFFERENTIAL     Status: Abnormal   Collection Time    10/01/13 12:11 PM      Result Value Ref Range   WBC 21.2 (*) 4.0 - 10.5 K/uL   RBC 3.27 (*) 3.87 - 5.11 MIL/uL   Hemoglobin 11.0 (*) 12.0 - 15.0 g/dL   HCT 32.4 (*) 36.0 -  46.0 %   MCV 99.1  78.0 - 100.0 fL   MCH 33.6  26.0 - 34.0 pg   MCHC 34.0  30.0 - 36.0 g/dL   RDW 14.7  11.5 - 15.5 %   Platelets 115 (*) 150 - 400 K/uL   Comment: SPECIMEN CHECKED FOR CLOTS     RESULT REPEATED AND VERIFIED     PLATELET COUNT CONFIRMED BY SMEAR   Neutrophils Relative % 87 (*) 43 - 77 %   Neutro Abs 18.5 (*) 1.7 - 7.7 K/uL   Lymphocytes Relative 6 (*) 12 - 46 %   Lymphs Abs 1.2  0.7 - 4.0 K/uL   Monocytes Relative 7  3 - 12 %   Monocytes Absolute 1.5 (*) 0.1 - 1.0 K/uL   Eosinophils Relative 0  0 - 5 %   Eosinophils Absolute 0.0  0.0 - 0.7 K/uL   Basophils Relative 0  0 - 1 %   Basophils Absolute 0.0  0.0 - 0.1 K/uL  BASIC METABOLIC PANEL     Status: Abnormal   Collection Time    10/01/13 12:11 PM      Result Value Ref Range   Sodium 134 (*) 137 - 147 mEq/L   Potassium 3.9  3.7 - 5.3 mEq/L   Chloride 95 (*) 96 - 112 mEq/L  CO2 26  19 - 32 mEq/L   Glucose, Bld 143 (*) 70 - 99 mg/dL   BUN 30 (*) 6 - 23 mg/dL   Creatinine, Ser 0.97  0.50 - 1.10 mg/dL   Calcium 9.1  8.4 - 10.5 mg/dL   GFR calc non Af Amer 48 (*) >90 mL/min   GFR calc Af Amer 56 (*) >90 mL/min   Comment: (NOTE)     The eGFR has been calculated using the CKD EPI equation.     This calculation has not been validated in all clinical situations.     eGFR's persistently <90 mL/min signify possible Chronic Kidney     Disease.   Anion gap 13  5 - 15  I-STAT TROPOININ, ED     Status: Abnormal   Collection Time    10/01/13 12:19 PM      Result Value Ref Range   Troponin i, poc 0.13 (*) 0.00 - 0.08 ng/mL   Comment NOTIFIED PHYSICIAN     Comment 3            Comment: Due to the release kinetics of cTnI,     a negative result within the first hours     of the onset of symptoms does not rule out     myocardial infarction with certainty.     If myocardial infarction is still suspected,     repeat the test at appropriate intervals.  TROPONIN I     Status: None   Collection Time    10/01/13 12:44 PM       Result Value Ref Range   Troponin I <0.30  <0.30 ng/mL   Comment:            Due to the release kinetics of cTnI,     a negative result within the first hours     of the onset of symptoms does not rule out     myocardial infarction with certainty.     If myocardial infarction is still suspected,     repeat the test at appropriate intervals.  URINALYSIS, ROUTINE W REFLEX MICROSCOPIC     Status: Abnormal   Collection Time    10/01/13  4:59 PM      Result Value Ref Range   Color, Urine AMBER (*) YELLOW   Comment: BIOCHEMICALS MAY BE AFFECTED BY COLOR   APPearance CLOUDY (*) CLEAR   Specific Gravity, Urine 1.020  1.005 - 1.030   pH 5.0  5.0 - 8.0   Glucose, UA NEGATIVE  NEGATIVE mg/dL   Hgb urine dipstick TRACE (*) NEGATIVE   Bilirubin Urine NEGATIVE  NEGATIVE   Ketones, ur NEGATIVE  NEGATIVE mg/dL   Protein, ur NEGATIVE  NEGATIVE mg/dL   Urobilinogen, UA 1.0  0.0 - 1.0 mg/dL   Nitrite NEGATIVE  NEGATIVE   Leukocytes, UA TRACE (*) NEGATIVE  URINE MICROSCOPIC-ADD ON     Status: None   Collection Time    10/01/13  4:59 PM      Result Value Ref Range   Squamous Epithelial / LPF RARE  RARE   WBC, UA 3-6  <3 WBC/hpf   RBC / HPF 0-2  <3 RBC/hpf   Bacteria, UA RARE  RARE  TSH     Status: None   Collection Time    10/01/13  5:07 PM      Result Value Ref Range   TSH 2.090  0.350 - 4.500 uIU/mL   Comment: Performed at [facility]  TROPONIN  I     Status: None   Collection Time    10/01/13  5:08 PM      Result Value Ref Range   Troponin I <0.30  <0.30 ng/mL   Comment:            Due to the release kinetics of cTnI,     a negative result within the first hours     of the onset of symptoms does not rule out     myocardial infarction with certainty.     If myocardial infarction is still suspected,     repeat the test at appropriate intervals.    Dg Chest 2 View  10/01/2013   CLINICAL DATA:  Mid chest pain and discomfort prior to and since a fall this morning, history  coronary artery disease  EXAM: CHEST  2 VIEW  COMPARISON:  05/27/2013  FINDINGS: Enlargement of cardiac silhouette.  Tortuous aorta.  Mediastinal contours and pulmonary vascularity normal.  Rotation to the RIGHT.  Elevation of LEFT diaphragm unchanged.  LEFT basilar atelectasis and small LEFT pleural effusion, chronic.  No definite acute infiltrate or pneumothorax.  Bones demineralized with BILATERAL glenohumeral degenerative changes, prior vertebroplasty of a lower thoracic vertebra, and additional thoracic compression fractures.  IMPRESSION: Enlargement of cardiac silhouette.  Chronic elevation of LEFT diaphragm with LEFT basilar atelectasis and small pleural effusion.  No acute abnormalities.   Electronically Signed   By: Lavonia Dana M.D.   On: 10/01/2013 13:08   Dg Hip Complete Right  10/01/2013   CLINICAL DATA:  [place] in the shower this morning injuring the right hip. Prior right total hip arthroplasty.  EXAM: RIGHT HIP - COMPLETE 2+ VIEW  COMPARISON:  Right hip and AP pelvis x-rays 11/12/2004. CT abdomen and pelvis 08/31/2013.  FINDINGS: Right total hip arthroplasty with anatomic alignment. No radiographic evidence of prosthetic loosening. Severe osseous demineralization, markedly progressive since the prior examination. Age indeterminate fracture involving the right superior pubic ramus, new since the CT 1 month ago.  Included AP pelvis shows a prior left total hip arthroplasty with anatomic alignment. No fractures elsewhere involving the bony pelvis. Degenerative changes involving the visualized lower lumbar spine. Calcification in the right upper pelvis, shown on prior CT to represent a calcified epiploic appendage. Oral contrast material within sigmoid colon diverticula related to the prior CT.  IMPRESSION: 1. Age indeterminate fracture of the right superior pubic ramus, new since the CT 1 month ago. Please correlate with point tenderness. 2. No evidence of acute fracture elsewhere. 3. Severe osseous  demineralization. 4. Prior bilateral total hip arthroplasties with anatomic alignment.   Electronically Signed   By: Evangeline Dakin M.D.   On: 10/01/2013 13:15   Dg Knee Complete 4 Views Right  10/01/2013   CLINICAL DATA:  RIGHT hip and knee pain, fell from shower this morning  EXAM: RIGHT KNEE - COMPLETE 4+ VIEW  COMPARISON:  None  FINDINGS: Severe osseous demineralization.  Hip joint imaged separately, excluded.  Large knee joint effusion.  Medial compartment joint space narrowing RIGHT knee with chondrocalcinosis question CPPD.  Minimally displaced fracture at medial tibial plateau likely extending into tibial spine region.  Additional chronic calcification medial to the medial tibial plateau likely from remote MCL injury.  No additional fracture dislocation.  IMPRESSION: Marked osseous demineralization.  Minimally displaced fracture at the medial tibial plateau likely extending into the tibial spine region, associated with large RIGHT knee joint effusion.   Electronically Signed   By: Lavonia Dana  M.D.   On: 10/01/2013 13:11    ROS Blood pressure 94/41, pulse 83, temperature 98 F (36.7 C), temperature source Oral, resp. rate 16, height 4' 8"  (1.422 m), weight 138 lb (62.596 kg), SpO2 94.00%. Physical Exam Physical Examination: General appearance - alert, well appearing, and in no distress Mental status - alert, oriented to person, place, and time Chest - clear to auscultation, no wheezes, rales or rhonchi, symmetric air entry Heart - normal rate, regular rhythm, normal S1, S2, no murmurs, rubs, clicks or gallops Abdomen - soft, nontender, nondistended, no masses or organomegaly Neurological - alert, oriented, normal speech, no focal findings or movement disorder noted Right hip - non-tender; no pain on rotation. Pelvis stable to compression/distraction. Right knee moderate effusion; tender proximal tibia medial and lateral; no deformity; no laxity Able to wiggle toes and dorsiflex/plantarflex  foot; sensation intact; toes warm and pink; all lower leg compartments soft  Xray- nondisplaced fracture tibial spine and medial cortex of proximal tibia. No articular incongruity   Assessment/Plan: Proximal tibia fracture- This can be managed non-operatively with a knee immobilizer and non-weight bearing for 6-8 weeks on her right lower extremity. She can touch down weight bear for transfers only. There was also a superior pubic ramus fracture identified on her pelvic x-ray but she is non-tender there clinically and the fracture is probably subacute or old. She needs to be mobilized OOB as she has a Stage IV decubitus according to her son  [name] 10/01/2013, 8:15 PM

## 2013-10-01 NOTE — ED Notes (Signed)
Spoke with Rochelle PA about I/O to obtain sample. Per PA, she said to wait on U/A

## 2013-10-01 NOTE — ED Notes (Signed)
Bed: WA19 Expected date:  Expected time:  Means of arrival:  Comments: EMS-fall 

## 2013-10-01 NOTE — ED Notes (Signed)
Patient fell in the shower this am with the aide. The patient fell onto her right side and complaining of right knee pain and unable to move right leg. Abrasion to the left knee, abrasions to left upper arm - wrapped and covered.

## 2013-10-01 NOTE — H&P (Signed)
[facility] History and Physical  [name] TDV:[id number] DOB: 1915-05-27 DOA: 10/01/2013  Referring physician:  Margarita Holden PCP:  Nicole Poll, MD   Chief Complaint:  Fall with knee pain  HPI:  The patient is a 80 y.o. 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  The patient was last at their baseline health 4 days ago.  For the last 4 days, she has felt more fatigued than usual and had some nausea particularly in the mornings. She has chronic diarrhea and has not noticed any change to her diarrhea. She denies chest pain or abdominal pain, fevers, dysuria. She states that she became progressively more weak a.m. was having difficulty ambulating for the last 2 days. Normally she ambulates with a rolling walker [name] distances, but she had difficulty even standing up since yesterday.  This morning, she was standing up after taking a shower, and she felt that her legs were weak. Her legs gave out and she fell forward onto her knees, particularly her right knee which twisted somewhat. She had immediate pain in the right knee and had difficulty bearing weight on that leg. She was put back into bed. A wound care nurse which tracks on her stage IV buttock wound, referred her to the emergency department for evaluation.  In the emergency department, her vital signs were notable for mildly low blood pressure 99/44, oxygen saturation 86% on room air, other vital signs within normal limits. Labs notable for white blood cell count 21.2, hemoglobin 11, platelets 1:15, sodium 134, BUN 30, creatinine 0.97.  Her initial point-of-care troponin was 0.13 but followup troponin I was less than 0.3.  EKG with normal sinus rhythm, new left bundle branch block compared to prior ECG, and subtly inverted T waves in V4 through V6. Chest x-ray  without acute abnormality.  X-ray of the right knee with minimally displaced fracture at the medial tibial plateau extending into the tibial spine region associated with a large joint effusion.  Her hip x-ray showed an indeterminate age fracture of the right superior pubic ramus new since her CT one month ago. No other acute fractures noted. All of her x-rays demonstrated severe osseous demineralization.   Review of Systems:  General:  Denies fevers, chills, weight loss or gain HEENT:  Denies changes to hearing and vision, rhinorrhea, sinus congestion, sore throat CV:  Denies palpitations, lower extremity edema. Some sternal chest pain after her fall. Worse with movement and with palpation. PULM:  Denies SOB, wheezing, cough.   GI:  Per history of present illness  GU:  Denies dysuria, frequency, urgency ENDO:  Denies polyuria, polydipsia.   HEME:  Denies hematemesis, blood in stools, melena, abnormal bruising or bleeding.  LYMPH:  Denies lymphadenopathy.   MSK:  Denies arthralgias, myalgias.   DERM:  Denies skin rash or ulcer.   NEURO:  Denies focal numbness, weakness, slurred speech, confusion, facial droop.  PSYCH:  Denies anxiety and depression.    Past Medical History  Diagnosis Date  . Arthritis   . Coronary artery disease   . GERD (gastroesophageal reflux disease)    Past Surgical History  Procedure Laterality Date  . Hip fracture surgery      x 3  . Orif femur fracture Left 12/13/2012    Procedure: OPEN REDUCTION INTERNAL FIXATION (ORIF) DISTAL FEMUR FRACTURE;  Surgeon: [name]  Nicole Dame, MD;  Location: [facility];  Service: Orthopedics;  Laterality: Left;  . Appendectomy    . Cholecystectomy    . Kyphoplasty     Social History:  reports that she has never smoked. She has never used smokeless tobacco. She reports that she does not drink alcohol or use illicit drugs. Lives at [facility] assisted living, ambulates with a rolling walker Kristyn Obyrne distances. Uses wheelchair part of  the time. Spends most of her day sitting in a recliner.  Allergies  Allergen Reactions  . Penicillins Other (See Comments)    Childhood allergy; reaction unknown    Family History  Problem Relation Age of Onset  . Diabetes Maternal Grandmother   . Diabetes Paternal Grandmother      Prior to Admission medications   Medication Sig Start Date End Date Taking? Authorizing Provider  cholestyramine Lucrezia Starch) 4 G packet Take 4 g by mouth daily as needed (for loose stools).   Yes Historical Provider, MD  clindamycin (CLEOCIN) 300 MG capsule Take 600 mg by mouth See admin instructions. Takes 600mg  as needed 1-hour prior to any scheduled dental appointment   Yes Historical Provider, MD  clonazePAM (KLONOPIN) 0.5 MG tablet Take 0.5 mg by mouth at bedtime.   Yes Historical Provider, MD  Eyelid Cleansers (OCUSOFT LID SCRUB) PADS Place 1 each into both eyes 2 (two) times daily.   Yes Historical Provider, MD  feeding supplement, ENSURE COMPLETE, (ENSURE COMPLETE) LIQD Take 237 mLs by mouth 3 (three) times daily between meals. 05/29/13  Yes Delfina Redwood, MD  hydrochlorothiazide (MICROZIDE) 12.5 MG capsule Take 12.5 mg by mouth daily.   Yes Historical Provider, MD  hydrocortisone cream 0.5 % Apply 1 application topically as needed for itching. Apply to forehead   Yes Historical Provider, MD  hydroxypropyl methylcellulose (ISOPTO TEARS) 2.5 % ophthalmic solution Place 1 drop into both eyes 2 (two) times daily.   Yes Historical Provider, MD  levofloxacin (LEVAQUIN) 250 MG tablet Take 250 mg by mouth daily. Takes for 2 weeks 10/01/13  Yes Historical Provider, MD  levothyroxine (SYNTHROID, LEVOTHROID) 25 MCG tablet Take 25 mcg by mouth daily before breakfast.   Yes Historical Provider, MD  magnesium oxide (MAG-OX) 400 MG tablet Take 400 mg by mouth daily.   Yes Historical Provider, MD  Menthol, Topical Analgesic, (ICY HOT PAIN RELIEVING) 2.5 % GEL Apply 1 application topically as needed (for lower back  pain).   Yes Historical Provider, MD  Multiple Vitamins-Minerals (ICAPS) TABS Take 1 tablet by mouth 2 (two) times daily.   Yes Historical Provider, MD  Multiple Vitamins-Minerals (THEREMS M PO) Take 1 tablet by mouth daily with breakfast.   Yes Historical Provider, MD  omeprazole (PRILOSEC) 40 MG capsule Take 1 capsule (40 mg total) by mouth every morning. 05/29/13  Yes Delfina Redwood, MD  ondansetron (ZOFRAN) 4 MG tablet Take 4 mg by mouth 2 (two) times daily as needed for nausea or vomiting. 10/01/13  Yes Historical Provider, MD  oxyCODONE-acetaminophen (PERCOCET/ROXICET) 5-325 MG per tablet Take 1 tablet by mouth every 6 (six) hours as needed for severe pain.   Yes Historical Provider, MD  polyethylene glycol (MIRALAX / GLYCOLAX) packet Take 17 g by mouth daily as needed for mild constipation.   Yes Historical Provider, MD  potassium chloride (K-DUR) 10 MEQ tablet Take 1 tablet (10 mEq total) by mouth every morning. 05/29/13  Yes Delfina Redwood, MD   Physical Exam: Filed Vitals:   10/01/13 1130 10/01/13 1350 10/01/13  1351 10/01/13 1417  BP:  99/44 99/44 102/51  Pulse:  87 85 84  Temp:  97.8 F (36.6 C)    TempSrc:  Oral    Resp:  18 18 21   SpO2: 98% 100% 100% 100%     General:  Thin female, in no acute distress  Eyes:  PERRL, anicteric, non-injected.  ENT:  Nares clear.  OP clear, non-erythematous without plaques or exudates.  Mildly dry mucous membranes.  Neck:  Supple without TM or JVD.    Lymph:  No cervical, supraclavicular, or submandibular LAD.  Cardiovascular:  RRR, normal S1, S2, without m/r/g.  2+ pulses, warm extremities  Respiratory:  Rales throughout the lower and mid back that clear somewhat with repeat respirations, no wheezing or rhonchi, without increased WOB.  Abdomen:  NABS.  Soft, ND/NT.    Skin:  No rashes or focal lesions.  Musculoskeletal:  Normal bulk and tone.  No LE edema.  Right knee with tenderness to palpation along the tibial plateau.     Psychiatric:  A & O x 4.  Appropriate affect.  Neurologic:  CN 3-12 intact.  4+/5 strength throughout except right lower extremity not tested secondary to pain.  Sensation intact.  Labs on Admission:  Basic Metabolic Panel:  Recent Labs Lab 10/01/13 1211  NA 134*  K 3.9  CL 95*  CO2 26  GLUCOSE 143*  BUN 30*  CREATININE 0.97  CALCIUM 9.1   Liver Function Tests: No results found for this basename: AST, ALT, ALKPHOS, BILITOT, PROT, ALBUMIN,  in the last 168 hours No results found for this basename: LIPASE, AMYLASE,  in the last 168 hours No results found for this basename: AMMONIA,  in the last 168 hours CBC:  Recent Labs Lab 10/01/13 1211  WBC 21.2*  NEUTROABS 18.5*  HGB 11.0*  HCT 32.4*  MCV 99.1  PLT 115*   Cardiac Enzymes:  Recent Labs Lab 10/01/13 1244  TROPONINI <0.30    BNP (last 3 results) No results found for this basename: PROBNP,  in the last 8760 hours CBG: No results found for this basename: GLUCAP,  in the last 168 hours  Radiological Exams on Admission: Dg Chest 2 View  10/01/2013   CLINICAL DATA:  Mid chest pain and discomfort prior to and since a fall this morning, history coronary artery disease  EXAM: CHEST  2 VIEW  COMPARISON:  05/27/2013  FINDINGS: Enlargement of cardiac silhouette.  Tortuous aorta.  Mediastinal contours and pulmonary vascularity normal.  Rotation to the RIGHT.  Elevation of LEFT diaphragm unchanged.  LEFT basilar atelectasis and small LEFT pleural effusion, chronic.  No definite acute infiltrate or pneumothorax.  Bones demineralized with BILATERAL glenohumeral degenerative changes, prior vertebroplasty of a lower thoracic vertebra, and additional thoracic compression fractures.  IMPRESSION: Enlargement of cardiac silhouette.  Chronic elevation of LEFT diaphragm with LEFT basilar atelectasis and small pleural effusion.  No acute abnormalities.   Electronically Signed   By: Lavonia Dana M.D.   On: 10/01/2013 13:08   Dg Hip  Complete Right  10/01/2013   CLINICAL DATA:  [place] in the shower this morning injuring the right hip. Prior right total hip arthroplasty.  EXAM: RIGHT HIP - COMPLETE 2+ VIEW  COMPARISON:  Right hip and AP pelvis x-rays 11/12/2004. CT abdomen and pelvis 08/31/2013.  FINDINGS: Right total hip arthroplasty with anatomic alignment. No radiographic evidence of prosthetic loosening. Severe osseous demineralization, markedly progressive since the prior examination. Age indeterminate fracture involving the right superior pubic  ramus, new since the CT 1 month ago.  Included AP pelvis shows a prior left total hip arthroplasty with anatomic alignment. No fractures elsewhere involving the bony pelvis. Degenerative changes involving the visualized lower lumbar spine. Calcification in the right upper pelvis, shown on prior CT to represent a calcified epiploic appendage. Oral contrast material within sigmoid colon diverticula related to the prior CT.  IMPRESSION: 1. Age indeterminate fracture of the right superior pubic ramus, new since the CT 1 month ago. Please correlate with point tenderness. 2. No evidence of acute fracture elsewhere. 3. Severe osseous demineralization. 4. Prior bilateral total hip arthroplasties with anatomic alignment.   Electronically Signed   By: Evangeline Dakin M.D.   On: 10/01/2013 13:15   Dg Knee Complete 4 Views Right  10/01/2013   CLINICAL DATA:  RIGHT hip and knee pain, fell from shower this morning  EXAM: RIGHT KNEE - COMPLETE 4+ VIEW  COMPARISON:  None  FINDINGS: Severe osseous demineralization.  Hip joint imaged separately, excluded.  Large knee joint effusion.  Medial compartment joint space narrowing RIGHT knee with chondrocalcinosis question CPPD.  Minimally displaced fracture at medial tibial plateau likely extending into tibial spine region.  Additional chronic calcification medial to the medial tibial plateau likely from remote MCL injury.  No additional fracture dislocation.   IMPRESSION: Marked osseous demineralization.  Minimally displaced fracture at the medial tibial plateau likely extending into the tibial spine region, associated with large RIGHT knee joint effusion.   Electronically Signed   By: Lavonia Dana M.D.   On: 10/01/2013 13:11    EKG: Independently reviewed. EKG with normal sinus rhythm, new left bundle branch block compared to prior ECG, and subtly inverted T waves in V4 through V6.   Assessment/Plan Active Problems:   Fracture, tibial plateau   Right medial tibial plateau fracture  ---  Right tibial plateau fracture And Right superior pubic rami fracture -  DVT prophylaxis per orthopedic surgery -  Continue Percocet when necessary pain with fentanyl for breakthrough pain -  Nonweightbearing pending orthopedic evaluation -  Foley for comfort -  She is moderate to high risk for surgery given age and CAD.  Okay to proceed with surgery if necessary  Acute hypoxic respiratory failure likely secondary to atelectasis -  Incentive spirometry -  Once cleared by orthopedics, out of bed as much as possible  CAD with new left bundle branch block, episode of chest pain and a mildly elevated point-of-care troponin.  If she had some ischemia earlier this week that would explain her nausea and her progressive fatigue. -  Aspirin given in the emergency department -  Discussed the case with cardiology who recommended against further testing such as ECHO, stress.   -  Telemetry -  Not on beta blocker or statin.  Blood pressure too low for BB and probably would not get much benefit from statin long term given comorbities, plus the side effects of muscle weakness and clouded thinking may outweigh the benefits  GERD, stable, continue PPI  Hypothyroidism with increased fatigue -  Check TSH -  Synthroid  Arthritis and osteoporosis -  Pain management -  Would try to minimize use of PPI if possible -  Defer further management to primary care  Doctor  Leukocytosis may be reactive secondary to fall and fracture, dehydration. Will rule out urinary tract infection. -  Chest x-ray negative for pneumonia -  Urinalysis pending  Normocytic anemia, likely secondary to anemia of chronic disease -  Repeat  in a.m.  Thrombocytopenia, acute phase reactant -  Repeat in a.m.  Hyponatremia and elevated BUN to creatinine ratio likely secondary to dehydration -  IV fluids -  Repeat BMP in a.m.  Stage IV ulcer of left buttock with packing -  Will continue antibiotics that were prescribed today for two-week course (levofloxacin) -  Wound care consult -  Air mattress -  Once fractures have been stabilized, out of bed as much as possible  Diet:  N.p.o. Access:  PIV IVF:  Yes Proph:  SCDs  Code Status: DO NOT RESUSCITATE Family Communication: Patient and her son Disposition Plan: Admit to telemetry  Time spent: 60 min Janece Canterbury [facility] Pager [phone]  If 7PM-7AM, please contact night-coverage [email or web address] Password [facility] 10/01/2013, 3:18 PM

## 2013-10-01 NOTE — ED Provider Notes (Addendum)
CSN: [id number]     Arrival date & time 10/01/13  1126 History   First MD Initiated Contact with Patient 10/01/13 1134     Chief Complaint  Patient presents with  . Fall  . Laceration  . Knee Pain     (Consider location/radiation/quality/duration/timing/severity/associated sxs/prior Treatment) Patient is a 80 y.o. female presenting with fall, skin laceration, and knee pain.  Fall  Laceration Knee Pain   [name] A 80 year old female brought in by EMS for fall and right knee pain. She has a past medical history of arthritis, CAD, GERD. She is followed by Dr. Alvan Dame in orthopedics. Her PCP is Dr. Mallie Mussel tripp. Patient was getting out of her shower when she lost her footing and fell onto her right knee. She complains of severe pain in the right knee, she was unable to ambulate immediately after fall. She is unsure if she twisted. He denies any popping. She states it is severe pain. She denies any numbness or tingling in the foot. She denies any hip back or pelvic pain. Patient not on any blood thinners. She also complains of chest pain that has been going on since this morning. She denies a nausea, vomiting, diaphoresis. She denied a history of smoking, diabetes, hypertension. She does have a history of hyperlipidemia. In no acute distress. Past Medical History  Diagnosis Date  . Arthritis   . Coronary artery disease   . GERD (gastroesophageal reflux disease)    Past Surgical History  Procedure Laterality Date  . Hip fracture surgery      x 3  . Orif femur fracture Left 12/13/2012    Procedure: OPEN REDUCTION INTERNAL FIXATION (ORIF) DISTAL FEMUR FRACTURE;  Surgeon: Mauri Pole, MD;  Location: [facility];  Service: Orthopedics;  Laterality: Left;  . Appendectomy    . Cholecystectomy     History reviewed. No pertinent family history. History  Substance Use Topics  . Smoking status: Never Smoker   . Smokeless tobacco: Never Used  . Alcohol Use: No   OB History   Grav Para  Term Preterm Abortions TAB SAB Ect Mult Living                 Review of Systems  Ten systems reviewed and are negative for acute change, except as noted in the HPI.    Allergies  Penicillins  Home Medications   Prior to Admission medications   Medication Sig Start Date End Date Taking? Authorizing Provider  cholestyramine Lucrezia Starch) 4 G packet Take 4 g by mouth daily as needed (for loose stools).   Yes Historical Provider, MD  clindamycin (CLEOCIN) 300 MG capsule Take 600 mg by mouth See admin instructions. Takes 600mg  as needed 1-hour prior to any scheduled dental appointment   Yes Historical Provider, MD  clonazePAM (KLONOPIN) 0.5 MG tablet Take 0.5 mg by mouth at bedtime.   Yes Historical Provider, MD  Eyelid Cleansers (OCUSOFT LID SCRUB) PADS Place 1 each into both eyes 2 (two) times daily.   Yes Historical Provider, MD  feeding supplement, ENSURE COMPLETE, (ENSURE COMPLETE) LIQD Take 237 mLs by mouth 3 (three) times daily between meals. 05/29/13  Yes Delfina Redwood, MD  hydrochlorothiazide (MICROZIDE) 12.5 MG capsule Take 12.5 mg by mouth daily.   Yes Historical Provider, MD  hydrocortisone cream 0.5 % Apply 1 application topically as needed for itching. Apply to forehead   Yes Historical Provider, MD  hydroxypropyl methylcellulose (ISOPTO TEARS) 2.5 % ophthalmic solution Place 1 drop into  both eyes 2 (two) times daily.   Yes Historical Provider, MD  levofloxacin (LEVAQUIN) 250 MG tablet Take 250 mg by mouth daily. Takes for 2 weeks 10/01/13  Yes Historical Provider, MD  levothyroxine (SYNTHROID, LEVOTHROID) 25 MCG tablet Take 25 mcg by mouth daily before breakfast.   Yes Historical Provider, MD  magnesium oxide (MAG-OX) 400 MG tablet Take 400 mg by mouth daily.   Yes Historical Provider, MD  Menthol, Topical Analgesic, (ICY HOT PAIN RELIEVING) 2.5 % GEL Apply 1 application topically as needed (for lower back pain).   Yes Historical Provider, MD  Multiple Vitamins-Minerals (ICAPS)  TABS Take 1 tablet by mouth 2 (two) times daily.   Yes Historical Provider, MD  Multiple Vitamins-Minerals (THEREMS M PO) Take 1 tablet by mouth daily with breakfast.   Yes Historical Provider, MD  omeprazole (PRILOSEC) 40 MG capsule Take 1 capsule (40 mg total) by mouth every morning. 05/29/13  Yes Delfina Redwood, MD  ondansetron (ZOFRAN) 4 MG tablet Take 4 mg by mouth 2 (two) times daily as needed for nausea or vomiting. 10/01/13  Yes Historical Provider, MD  oxyCODONE-acetaminophen (PERCOCET/ROXICET) 5-325 MG per tablet Take 1 tablet by mouth every 6 (six) hours as needed for severe pain.   Yes Historical Provider, MD  polyethylene glycol (MIRALAX / GLYCOLAX) packet Take 17 g by mouth daily as needed for mild constipation.   Yes Historical Provider, MD  potassium chloride (K-DUR) 10 MEQ tablet Take 1 tablet (10 mEq total) by mouth every morning. 05/29/13  Yes Delfina Redwood, MD   BP 102/41  Pulse 87  Temp(Src) 97.6 F (36.4 C) (Oral)  Resp 26  SpO2 98% Physical Exam  Constitutional: She is oriented to person, place, and time. She appears well-developed and well-nourished. No distress.  HENT:  Head: Normocephalic and atraumatic.  Eyes: Conjunctivae are normal. No scleral icterus.  Neck: Normal range of motion.  Cardiovascular: Normal rate, regular rhythm and normal heart sounds.  Exam reveals no gallop and no friction rub.   No murmur heard. Pulmonary/Chest: Effort normal and breath sounds normal. No respiratory distress.  Abdominal: Soft. Bowel sounds are normal. She exhibits no distension and no mass. There is no tenderness. There is no guarding.  Musculoskeletal:  A right knee exam was performed. SKIN: intact SWELLING: moderate EFFUSION: yes WARMTH: no warmth TENDERNESS: none and diffuse ROM: limited by pain NEUROVASCULAR EXAM: normal SOFT COMPARTMENTS  A right hip exam was performed. SKIN: intact SWELLING: none WARMTH: no warmth TENDERNESS: none NEUROLOGICAL EXAM:  normal VASCULAR EXAM: normal ROM deffered due to pain in knee   Neurological: She is alert and oriented to person, place, and time.  Skin: Skin is warm and dry. She is not diaphoretic.    ED Course  Procedures (including critical care time) Labs Review Labs Reviewed  CBC WITH DIFFERENTIAL - Abnormal; Notable for the following:    WBC 21.2 (*)    RBC 3.27 (*)    Hemoglobin 11.0 (*)    HCT 32.4 (*)    Platelets 115 (*)    Neutrophils Relative % 87 (*)    Neutro Abs 18.5 (*)    Lymphocytes Relative 6 (*)    Monocytes Absolute 1.5 (*)    All other components within normal limits  BASIC METABOLIC PANEL - Abnormal; Notable for the following:    Sodium 134 (*)    Chloride 95 (*)    Glucose, Bld 143 (*)    BUN 30 (*)    GFR  calc non Af Amer 48 (*)    GFR calc Af Amer 56 (*)    All other components within normal limits  I-STAT TROPOININ, ED - Abnormal; Notable for the following:    Troponin i, poc 0.13 (*)    All other components within normal limits  TROPONIN I  URINALYSIS, ROUTINE W REFLEX MICROSCOPIC    Imaging Review Dg Chest 2 View  10/01/2013   CLINICAL DATA:  Mid chest pain and discomfort prior to and since a fall this morning, history coronary artery disease  EXAM: CHEST  2 VIEW  COMPARISON:  05/27/2013  FINDINGS: Enlargement of cardiac silhouette.  Tortuous aorta.  Mediastinal contours and pulmonary vascularity normal.  Rotation to the RIGHT.  Elevation of LEFT diaphragm unchanged.  LEFT basilar atelectasis and small LEFT pleural effusion, chronic.  No definite acute infiltrate or pneumothorax.  Bones demineralized with BILATERAL glenohumeral degenerative changes, prior vertebroplasty of a lower thoracic vertebra, and additional thoracic compression fractures.  IMPRESSION: Enlargement of cardiac silhouette.  Chronic elevation of LEFT diaphragm with LEFT basilar atelectasis and small pleural effusion.  No acute abnormalities.   Electronically Signed   By: Lavonia Dana M.D.   On:  10/01/2013 13:08   Dg Hip Complete Right  10/01/2013   CLINICAL DATA:  [place] in the shower this morning injuring the right hip. Prior right total hip arthroplasty.  EXAM: RIGHT HIP - COMPLETE 2+ VIEW  COMPARISON:  Right hip and AP pelvis x-rays 11/12/2004. CT abdomen and pelvis 08/31/2013.  FINDINGS: Right total hip arthroplasty with anatomic alignment. No radiographic evidence of prosthetic loosening. Severe osseous demineralization, markedly progressive since the prior examination. Age indeterminate fracture involving the right superior pubic ramus, new since the CT 1 month ago.  Included AP pelvis shows a prior left total hip arthroplasty with anatomic alignment. No fractures elsewhere involving the bony pelvis. Degenerative changes involving the visualized lower lumbar spine. Calcification in the right upper pelvis, shown on prior CT to represent a calcified epiploic appendage. Oral contrast material within sigmoid colon diverticula related to the prior CT.  IMPRESSION: 1. Age indeterminate fracture of the right superior pubic ramus, new since the CT 1 month ago. Please correlate with point tenderness. 2. No evidence of acute fracture elsewhere. 3. Severe osseous demineralization. 4. Prior bilateral total hip arthroplasties with anatomic alignment.   Electronically Signed   By: Evangeline Dakin M.D.   On: 10/01/2013 13:15   Dg Knee Complete 4 Views Right  10/01/2013   CLINICAL DATA:  RIGHT hip and knee pain, fell from shower this morning  EXAM: RIGHT KNEE - COMPLETE 4+ VIEW  COMPARISON:  None  FINDINGS: Severe osseous demineralization.  Hip joint imaged separately, excluded.  Large knee joint effusion.  Medial compartment joint space narrowing RIGHT knee with chondrocalcinosis question CPPD.  Minimally displaced fracture at medial tibial plateau likely extending into tibial spine region.  Additional chronic calcification medial to the medial tibial plateau likely from remote MCL injury.  No additional  fracture dislocation.  IMPRESSION: Marked osseous demineralization.  Minimally displaced fracture at the medial tibial plateau likely extending into the tibial spine region, associated with large RIGHT knee joint effusion.   Electronically Signed   By: Lavonia Dana M.D.   On: 10/01/2013 13:11     EKG Interpretation   Date/Time:  Friday October 01 2013 12:24:42 EDT Ventricular Rate:  84 PR Interval:  168 QRS Duration: 124 QT Interval:  476 QTC Calculation: 563 R Axis:   -69 Text  Interpretation:  Sinus rhythm Left bundle branch block Artifact in  lead(s) I III aVR aVL aVF LVH Confirmed by HARRISON  MD, FORREST ([id number]) on  10/01/2013 12:35:08 PM      MDM   Final diagnoses:  Tibial plateau fracture, right, closed, initial encounter  Pubic ramus fracture, right, closed, initial encounter  Leukocytosis    Patient with R knee pain after fall. C/o chest pain since this morning.  ACS work up and imaging pending. Pain control with fentanyl.  Patient EKG without concern for ischemia. poct troponin elevated, however Repeat troponin lab is not and I consider it lab error.cxr shows no acute abnrormality.  Hip and knee frx shows tibial plateau  R knee and  Age indeterminate R pubic ramus frx. I have spoken with Dr. Wynelle Link who will consult on the Patient. Dr. Sheran Fava will admit the patient . Pain well controlled at this time. Doubt ACS. Patient stable for admission.  Margarita Mail, PA-C 10/05/13 2048  Margarita Mail, PA-C 10/11/13 1642

## 2013-10-02 DIAGNOSIS — E43 Unspecified severe protein-calorie malnutrition: Secondary | ICD-10-CM

## 2013-10-02 LAB — BASIC METABOLIC PANEL
Anion gap: 11 (ref 5–15)
BUN: 36 mg/dL — AB (ref 6–23)
CHLORIDE: 95 meq/L — AB (ref 96–112)
CO2: 27 mEq/L (ref 19–32)
CREATININE: 0.95 mg/dL (ref 0.50–1.10)
Calcium: 8.6 mg/dL (ref 8.4–10.5)
GFR calc non Af Amer: 49 mL/min — ABNORMAL LOW (ref >90)
GFR, EST AFRICAN AMERICAN: 57 mL/min — AB (ref >90)
Glucose, Bld: 187 mg/dL — ABNORMAL HIGH (ref 70–99)
Potassium: 3.9 mEq/L (ref 3.7–5.3)
Sodium: 133 mEq/L — ABNORMAL LOW (ref 137–147)

## 2013-10-02 LAB — URINE CULTURE
Colony Count: NO GROWTH
Culture: NO GROWTH

## 2013-10-02 LAB — CBC
HEMATOCRIT: 29 % — AB (ref 36.0–46.0)
HEMOGLOBIN: 9.8 g/dL — AB (ref 12.0–15.0)
MCH: 33.6 pg (ref 26.0–34.0)
MCHC: 33.8 g/dL (ref 30.0–36.0)
MCV: 99.3 fL (ref 78.0–100.0)
Platelets: 105 10*3/uL — ABNORMAL LOW (ref 150–400)
RBC: 2.92 MIL/uL — ABNORMAL LOW (ref 3.87–5.11)
RDW: 14.7 % (ref 11.5–15.5)
WBC: 16.1 10*3/uL — ABNORMAL HIGH (ref 4.0–10.5)

## 2013-10-02 LAB — TROPONIN I

## 2013-10-02 MED ORDER — ENOXAPARIN SODIUM 30 MG/0.3ML ~~LOC~~ SOLN
30.0000 mg | SUBCUTANEOUS | Status: DC
  Administered 2013-10-02 – 2013-10-04 (×3): 30 mg via SUBCUTANEOUS
  Filled 2013-10-02 (×3): qty 0.3

## 2013-10-02 MED ORDER — MORPHINE SULFATE 2 MG/ML IJ SOLN
1.0000 mg | INTRAMUSCULAR | Status: DC | PRN
  Administered 2013-10-02: 1 mg via INTRAVENOUS
  Filled 2013-10-02: qty 1

## 2013-10-02 MED ORDER — OXYCODONE-ACETAMINOPHEN 5-325 MG PO TABS
1.0000 | ORAL_TABLET | ORAL | Status: DC | PRN
  Administered 2013-10-02: 2 via ORAL
  Administered 2013-10-03 – 2013-10-04 (×5): 1 via ORAL
  Filled 2013-10-02: qty 2
  Filled 2013-10-02 (×5): qty 1

## 2013-10-02 NOTE — Progress Notes (Signed)
Order written by Dr. Sheran Fava to remove foley catheter today. Patient was hesitant about this and wanted to work with PT before catheter came out. Patient worked with PT, but had very limited participation due to pain, was not able to transfer to chair or BSC, even after being medicated with oral and IV pain medication. Paged Dr. Sheran Fava regarding this, she said that it was ok to leave foley in for now. Patient is now resting. Will continue to monitor.

## 2013-10-02 NOTE — Progress Notes (Addendum)
[facility] PROGRESS NOTE  [name] OMB:[id number] DOB: 10/21/1915 DOA: 10/01/2013 PCP: Reymundo Poll, MD  Assessment/Plan  Right tibial plateau fracture And Right superior pubic rami fracture  -  Non-operative management -  NWB except TDWB RLE for transfers only -  Knee immobilizer at all times except when in CPM/PT - Continue Percocet prn -  D/c foley   Acute hypoxic respiratory failure likely secondary to atelectasis  - Incentive spirometry  - OOB and PT  Hypotension -  Minimize IV pain medication -  Check cortisol level -  Continue IVF  CAD with new left bundle branch block, no chest pain - Aspirin given in the emergency department   - Discussed the case with cardiology who recommended against further testing such as ECHO, stress.  - Telemetry:  NSR -  Okay to d/c telemetry -  Blood pressure too low for BB and probably would not get much benefit from statin long term given comorbities, plus the side effects of muscle weakness and clouded thinking may outweigh the benefits  GERD, stable, continue PPI   Hypothyroidism, TSH 2.09, continue synthroid   Arthritis and osteoporosis  - Pain management  - Would try to minimize use of PPI if possible  - Defer further management to primary care Doctor   Leukocytosis may be reactive secondary to fall and fracture, dehydration, improving - Chest x-ray negative for pneumonia  - Urinalysis with only 3-6 WBC and rare bacteria  Normocytic anemia, likely secondary to anemia of chronic disease, hgb trending down, may be dilutional - Repeat in a.m.   Thrombocytopenia, acute phase reactant, trending down, may be dilutional - Repeat in a.m.   Hyponatremia and elevated BUN to creatinine ratio likely secondary to dehydration, not eating - continue IV fluids  - Repeat BMP in a.m.   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  May need SNF placement for rehab.  SW consult.  Fair to poor prognosis.  She may benefit from palliative care following while at SNF   Consultants:  Gaynelle Arabian, Orthopedics  Procedures:  CXR  XR knee right  XR hip right  Antibiotics:  Levofloxacin 7/17   HPI/Subjective:  Having severe pain in the right leg, particularly when moving.  Denies SOB, cough.  No BM in 2 days, but feels like she may have one soon.    Objective: Filed Vitals:   10/01/13 1551 10/01/13 1755 10/01/13 2119 10/02/13 0459  BP: 94/41  96/49 100/41  Pulse: 83  91 99  Temp: 98 F (36.7 C)  97.9 F (36.6 C) 98.8 F (37.1 C)  TempSrc: Oral  Oral Oral  Resp: 16  22 20   Height: 4\' 8"  (1.422 m)     Weight: 62.596 kg (138 lb)     SpO2: 92% 94% 91% 91%    Intake/Output Summary (Last 24 hours) at 10/02/13 1157 Last data filed at 10/02/13 1105  Gross per 24 hour  Intake 966.25 ml  Output    600 ml  Net 366.25 ml   Filed Weights   10/01/13 1551  Weight: 62.596 kg (138 lb)    Exam:  General:  Cachectic CF, No acute distress  HEENT:  NCAT, MMM  Cardiovascular:  RRR, nl S1, S2 no mrg, 2+ pulses, warm extremities  Respiratory:  CTAB, no increased WOB  Abdomen:   NABS, soft, NT/ND  MSK:   Normal tone and bulk, no LEE.  Right knee TTP along tibial plateau and somewhat swollen.  < 2 sec CR, 2+ pedal pulse.    Neuro:  Grossly intact  Data Reviewed: Basic Metabolic Panel:  Recent Labs Lab 10/01/13 1211 10/02/13 0419  NA 134* 133*  K 3.9 3.9  CL 95* 95*  CO2 26 27  GLUCOSE 143* 187*  BUN 30* 36*  CREATININE 0.97 0.95  CALCIUM 9.1 8.6   Liver Function Tests: No results found for this basename: AST, ALT, ALKPHOS, BILITOT, PROT,  ALBUMIN,  in the last 168 hours No results found for this basename: LIPASE, AMYLASE,  in the last 168 hours No results found for this basename: AMMONIA,  in the last 168 hours CBC:  Recent Labs Lab 10/01/13 1211 10/02/13 0419  WBC 21.2* 16.1*  NEUTROABS 18.5*  --   HGB 11.0* 9.8*  HCT 32.4* 29.0*  MCV 99.1 99.3  PLT 115* 105*   Cardiac Enzymes:  Recent Labs Lab 10/01/13 1244 10/01/13 1708 10/01/13 2154 10/02/13 0419  TROPONINI <0.30 <0.30 <0.30 <0.30   BNP (last 3 results) No results found for this basename: PROBNP,  in the last 8760 hours CBG: No results found for this basename: GLUCAP,  in the last 168 hours  Recent Results (from the past 240 hour(s))  MRSA PCR SCREENING     Status: None   Collection Time    10/01/13  6:45 PM      Result Value Ref Range Status   MRSA by PCR NEGATIVE  NEGATIVE Final   Comment:            The GeneXpert MRSA Assay (FDA     approved for NASAL specimens     only), is one component of a     comprehensive MRSA colonization     surveillance program. It is not     intended to diagnose MRSA     infection nor to guide or     monitor treatment for     MRSA infections.     Studies: Dg Chest 2 View  10/01/2013   CLINICAL DATA:  Mid chest pain and discomfort prior to and since a fall this morning, history coronary artery disease  EXAM: CHEST  2 VIEW  COMPARISON:  05/27/2013  FINDINGS: Enlargement of cardiac silhouette.  Tortuous aorta.  Mediastinal contours and pulmonary vascularity normal.  Rotation to the RIGHT.  Elevation of LEFT diaphragm unchanged.  LEFT basilar atelectasis and small LEFT pleural effusion, chronic.  No definite acute infiltrate or pneumothorax.  Bones demineralized with BILATERAL glenohumeral degenerative changes, prior vertebroplasty of a lower thoracic vertebra, and additional thoracic compression fractures.  IMPRESSION: Enlargement of cardiac silhouette.  Chronic elevation of LEFT diaphragm with LEFT basilar atelectasis  and small pleural effusion.  No acute abnormalities.   Electronically Signed   By: Lavonia Dana M.D.   On: 10/01/2013 13:08   Dg Hip Complete Right  10/01/2013   CLINICAL DATA:  [place] in the shower this morning injuring the right hip. Prior right total hip arthroplasty.  EXAM: RIGHT HIP - COMPLETE 2+ VIEW  COMPARISON:  Right hip and AP pelvis x-rays 11/12/2004. CT abdomen and pelvis 08/31/2013.  FINDINGS: Right total hip arthroplasty with anatomic alignment.  No radiographic evidence of prosthetic loosening. Severe osseous demineralization, markedly progressive since the prior examination. Age indeterminate fracture involving the right superior pubic ramus, new since the CT 1 month ago.  Included AP pelvis shows a prior left total hip arthroplasty with anatomic alignment. No fractures elsewhere involving the bony pelvis. Degenerative changes involving the visualized lower lumbar spine. Calcification in the right upper pelvis, shown on prior CT to represent a calcified epiploic appendage. Oral contrast material within sigmoid colon diverticula related to the prior CT.  IMPRESSION: 1. Age indeterminate fracture of the right superior pubic ramus, new since the CT 1 month ago. Please correlate with point tenderness. 2. No evidence of acute fracture elsewhere. 3. Severe osseous demineralization. 4. Prior bilateral total hip arthroplasties with anatomic alignment.   Electronically Signed   By: Evangeline Dakin M.D.   On: 10/01/2013 13:15   Dg Knee Complete 4 Views Right  10/01/2013   CLINICAL DATA:  RIGHT hip and knee pain, fell from shower this morning  EXAM: RIGHT KNEE - COMPLETE 4+ VIEW  COMPARISON:  None  FINDINGS: Severe osseous demineralization.  Hip joint imaged separately, excluded.  Large knee joint effusion.  Medial compartment joint space narrowing RIGHT knee with chondrocalcinosis question CPPD.  Minimally displaced fracture at medial tibial plateau likely extending into tibial spine region.  Additional  chronic calcification medial to the medial tibial plateau likely from remote MCL injury.  No additional fracture dislocation.  IMPRESSION: Marked osseous demineralization.  Minimally displaced fracture at the medial tibial plateau likely extending into the tibial spine region, associated with large RIGHT knee joint effusion.   Electronically Signed   By: Lavonia Dana M.D.   On: 10/01/2013 13:11    Scheduled Meds: . aspirin EC  81 mg Oral Daily  . clonazePAM  0.5 mg Oral QHS  . feeding supplement (ENSURE COMPLETE)  237 mL Oral TID BM  . levofloxacin  250 mg Oral Daily  . levothyroxine  25 mcg Oral QAC breakfast  . multivitamin-lutein  1 capsule Oral BID  . pantoprazole  40 mg Oral Daily  . polyvinyl alcohol  1 drop Both Eyes BID  . potassium chloride  10 mEq Oral q morning - 10a  . sodium chloride  3 mL Intravenous Q12H   Continuous Infusions: . sodium chloride 75 mL/hr at 10/02/13 0704    Active Problems:   Fracture, tibial plateau   Right medial tibial plateau fracture    Time spent: 30 min    Errika Narvaiz, [facility]  [facility] Pager [phone]. If 7PM-7AM, please contact night-coverage at [email or web address], password [facility] 10/02/2013, 11:57 AM  LOS: 1 day

## 2013-10-02 NOTE — Evaluation (Signed)
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 kyphoplasty, ORIF femur 9/14  Clinical Impression  Pt will benefit from PT to address deficits below; pt will need 24 hr assist, SNF if possible at D/C;  Pt is willing to participate but limited by pain at time of eval;  She was a limited ambulator at her baseline, prior to fall;    Follow Up Recommendations SNF;Supervision/Assistance - 24 hour    Equipment Recommendations  None recommended by PT    Recommendations for Other Services       Precautions / Restrictions Precautions Required Braces or Orthoses: Knee Immobilizer - Right Knee Immobilizer - Right: On at all times Restrictions RLE Weight Bearing: Non weight bearing Other Position/Activity Restrictions: TDWB RLE for transfers per Dr. Wynelle Link      Mobility  Bed Mobility Overal bed mobility: Needs Assistance;+2 for physical assistance;+ 2 for safety/equipment Bed Mobility: Supine to Sit;Sit to Supine     Supine to sit: +2 for physical assistance;Total assist Sit to supine: +2 for physical assistance;Total assist   General bed mobility comments: cues for technique  Transfers Overall transfer level:  (attempted, pt unable due to pain)                  Ambulation/Gait                Stairs            Wheelchair Mobility    Modified Rankin (Stroke Patients Only)       Balance Overall balance assessment: Needs assistance Sitting-balance support: Feet unsupported;Feet supported         Standing balance-Leahy Scale: Zero Standing balance comment: pt unable to come to stand or wt shift onto LLE due to pain                              Pertinent Vitals/Pain 7/10 pain right knee, was premedicated    Home Living Family/patient expects to be discharged to:: Assisted living--??                 Additional Comments: resides at [facility]    Prior Function Level of Independence: Needs assistance   Gait / Transfers Assistance Needed: pt is not an accurate historian; apparently she amb short distances with RW at times  ADL's / Homemaking Assistance Needed: pt states she was receiving assist for bathing and dressing        Hand Dominance        Extremity/Trunk Assessment   Upper Extremity Assessment: RUE deficits/detail;LUE deficits/detail RUE Deficits / Details: grossly 3+/5 bil UEs         Lower Extremity Assessment: RLE deficits/detail;Generalized weakness RLE Deficits / Details: right ankle limited ROM (but this baseline per pt)    Cervical / Trunk Assessment: Kyphotic  Communication   Communication: HOH  Cognition Arousal/Alertness: Awake/alert Behavior During Therapy: Restless Overall Cognitive Status: Impaired/Different from baseline Area of Impairment: Safety/judgement;Following commands;Memory     Memory: Decreased short-term memory Following Commands: Follows one step commands with increased time Safety/Judgement: Decreased awareness of safety          General Comments  pt sat EOB  x 9 minutes but was not willing to attempt bed to chair transfers due to pain    Exercises        Assessment/Plan    PT Assessment Patient needs continued PT services  PT Diagnosis Generalized weakness;Acute pain   PT Problem List Decreased strength;Decreased range of motion;Decreased activity tolerance;Decreased balance;Decreased mobility;Decreased knowledge of precautions  PT Treatment Interventions Functional mobility training;Therapeutic activities;Therapeutic exercise;Patient/family education   PT Goals (Current goals can be found in the Care Plan section) Acute Rehab PT  Goals Patient Stated Goal: be able to do more PT Goal Formulation: With patient Time For Goal Achievement: 10/09/13 Potential to Achieve Goals: Fair    Frequency Min 3X/week   Barriers to discharge        Co-evaluation               End of Session Equipment Utilized During Treatment: Gait belt;Right knee immobilizer Activity Tolerance: Patient limited by fatigue;Patient limited by pain Patient left: in bed;with call bell/phone within reach Nurse Communication: Mobility status         Time: 6010-9323 PT Time Calculation (min): 32 min   Charges:   PT Evaluation $Initial PT Evaluation Tier I: 1 Procedure PT Treatments $Therapeutic Activity: 23-37 mins   PT G Codes:          Rashidi Loh 2013/10/31, 4:49 PM

## 2013-10-02 NOTE — Progress Notes (Signed)
ANTICOAGULATION CONSULT NOTE - Initial Consult  Pharmacy Consult for enoxaparin Indication: VTE prophylaxis  Allergies  Allergen Reactions  . Penicillins Other (See Comments)    Childhood allergy; reaction unknown    Patient Measurements: Height: 4\' 8"  (142.2 cm) Weight: 138 lb (62.596 kg) IBW/kg (Calculated) : 36.3 Heparin Dosing Weight:   Vital Signs: Temp: 98.8 F (37.1 C) (07/18 0459) Temp src: Oral (07/18 0459) BP: 100/41 mmHg (07/18 0459) Pulse Rate: 99 (07/18 0459)  Labs:  Recent Labs  10/01/13 1211  10/01/13 1708 10/01/13 2154 10/02/13 0419  HGB 11.0*  --   --   --  9.8*  HCT 32.4*  --   --   --  29.0*  PLT 115*  --   --   --  105*  CREATININE 0.97  --   --   --  0.95  TROPONINI  --   < > <0.30 <0.30 <0.30  < > = values in this interval not displayed.  Estimated Creatinine Clearance: 26.2 ml/min (by C-G formula based on Cr of 0.95).   Assessment: 89 YOF admitted s/p fall with proximal tibia fracture, no plans for OR per Ortho note.  Pharmacy asked to dose LMWH for VTE prophylaxis  SCr = 0.95 with est CrCl = 26.5ml/min  CBC: Hgb = 9.8 and pltc 105  Weight = 63kg  Goal of Therapy:  Anti-Xa level 0.3 - 0.6 units/ml 4hrs after LMWH dose given   Plan:   Start enoxaparin 30mg  SQ q24h based on CrCl < 61ml/min  Watch renal function and CBC  Pharmacy will follow at distance and intervene if change in renal function, etc.   Doreene Eland, PharmD, BCPS.   Pager: [phone]  10/02/2013,12:06 PM

## 2013-10-02 NOTE — Progress Notes (Signed)
OT Cancellation Note  Patient Details Name: [name] MRN: [id number] DOB: 12-13-1915   Cancelled Treatment:    Reason Eval/Treat Not Completed: Other (comment) Spoke to PT about pt's status.   Pt is Medicare/Medicaid and current D/C plan is SNF. No apparent immediate acute care OT needs, therefore will defer OT to SNF. If OT eval is needed please call Acute Rehab Dept. at [facility] 10/02/2013, 4:26 PM Lesle Chris, OTR/L [phone] 10/02/2013

## 2013-10-03 LAB — CBC
HCT: 28 % — ABNORMAL LOW (ref 36.0–46.0)
HEMOGLOBIN: 9.4 g/dL — AB (ref 12.0–15.0)
MCH: 33.5 pg (ref 26.0–34.0)
MCHC: 33.6 g/dL (ref 30.0–36.0)
MCV: 99.6 fL (ref 78.0–100.0)
PLATELETS: 107 10*3/uL — AB (ref 150–400)
RBC: 2.81 MIL/uL — AB (ref 3.87–5.11)
RDW: 14.8 % (ref 11.5–15.5)
WBC: 11.6 10*3/uL — ABNORMAL HIGH (ref 4.0–10.5)

## 2013-10-03 LAB — CORTISOL-AM, BLOOD: Cortisol - AM: 13.8 ug/dL (ref 4.3–22.4)

## 2013-10-03 LAB — BASIC METABOLIC PANEL
Anion gap: 9 (ref 5–15)
BUN: 38 mg/dL — AB (ref 6–23)
CHLORIDE: 101 meq/L (ref 96–112)
CO2: 26 mEq/L (ref 19–32)
Calcium: 8.7 mg/dL (ref 8.4–10.5)
Creatinine, Ser: 0.98 mg/dL (ref 0.50–1.10)
GFR calc non Af Amer: 48 mL/min — ABNORMAL LOW (ref >90)
GFR, EST AFRICAN AMERICAN: 55 mL/min — AB (ref >90)
GLUCOSE: 122 mg/dL — AB (ref 70–99)
Potassium: 4.5 mEq/L (ref 3.7–5.3)
Sodium: 136 mEq/L — ABNORMAL LOW (ref 137–147)

## 2013-10-03 MED ORDER — DOCUSATE SODIUM 100 MG PO CAPS
100.0000 mg | ORAL_CAPSULE | Freq: Two times a day (BID) | ORAL | Status: DC
  Administered 2013-10-03 – 2013-10-04 (×3): 100 mg via ORAL
  Filled 2013-10-03 (×4): qty 1

## 2013-10-03 NOTE — Clinical Social Work Placement (Signed)
Clinical Social Work Department CLINICAL SOCIAL WORK PLACEMENT NOTE 10/03/2013  Patient:  [name], [name]  Account Number:  [id number] Admit date:  10/01/2013  Clinical Social Worker:  Kemper Durie, [place]  Date/time:  10/03/2013 04:02 PM  Clinical Social Work is seeking post-discharge placement for this patient at the following level of care:   SKILLED NURSING   (*CSW will update this form in Epic as items are completed)   10/03/2013  Patient/family provided with [place] Department of Clinical Social Work's list of facilities offering this level of care within the geographic area requested by the patient (or if unable, by the patient's family).  10/03/2013  Patient/family informed of their freedom to choose among providers that offer the needed level of care, that participate in Medicare, Medicaid or managed care program needed by the patient, have an available bed and are willing to accept the patient.  10/03/2013  Patient/family informed of [facility]' ownership interest in [facility], as well as of the fact that they are under no obligation to receive care at this facility.  PASARR submitted to EDS on  PASARR number received on   FL2 transmitted to all facilities in geographic area requested by pt/family on  10/03/2013 FL2 transmitted to all facilities within larger geographic area on   Patient informed that his/her managed care company has contracts with or will negotiate with  certain facilities, including the following:     Patient/family informed of bed offers received:   Patient chooses bed at  Physician recommends and patient chooses bed at    Patient to be transferred to  on   Patient to be transferred to facility by  Patient and family notified of transfer on  Name of family member notified:    The following physician request were entered in Epic:   Additional Comments:    Liz Beach MSW, [place], [place], [phone]

## 2013-10-03 NOTE — Clinical Social Work Psychosocial (Signed)
Clinical Social Work Department BRIEF PSYCHOSOCIAL ASSESSMENT 10/03/2013  Patient:  [name], [name]     Account Number:  [id number]     Admit date:  10/01/2013  Clinical Social Worker:  Lovey Newcomer  Date/Time:  10/03/2013 12:30 PM  Referred by:  Physician  Date Referred:  10/03/2013 Referred for  SNF Placement   Other Referral:   Interview type:  Patient Other interview type:   Patient alert and oriented at time of assessment.    PSYCHOSOCIAL DATA Living Status:  FACILITY Admitted from facility:  [facility] Level of care:  Assisted Living Primary support name:  Clare Gandy and Izora Gala Primary support relationship to patient:  CHILD, ADULT Degree of support available:   Support is strong.    CURRENT CONCERNS Current Concerns  Post-Acute Placement   Other Concerns:    SOCIAL WORK ASSESSMENT / PLAN CSW met with patient at bedside to complete assessment. Patient resting comfortably in reclining chair. CSW introduced self and explained role. CSW explained that PT has recommended SNF for patient and patient was aware. Patient states that she agrees with this recommendation and states that she prefers to go to [place] or [place] at discharge. CSW explained SNF search/placement process to patient and answered questions. Patient was welcoming of CSW and engaged in assessment. CSW will follow up with bed offers when available.   Assessment/plan status:  Psychosocial Support/Ongoing Assessment of Needs Other assessment/ plan:   Complete Fl2, Fax, PASRR   Information/referral to community resources:   SNF list given.    PATIENT'S/FAMILY'S RESPONSE TO PLAN OF CARE: Patient states that she is agreeable to SNF placement at discharge. CSW will assist as appropriate.       Liz Beach MSW, [place], [place], [phone]

## 2013-10-03 NOTE — Progress Notes (Signed)
[facility] PROGRESS NOTE  [name] VVO:[id number] DOB: 03-19-1915 DOA: 10/01/2013 PCP: Reymundo Poll, MD  Assessment/Plan  Right tibial plateau fracture And Right superior pubic rami fracture  -  Non-operative management -  NWB except TDWB RLE for transfers only -  Knee immobilizer at all times except when in CPM/PT  - Continue Percocet prn -  D/c foley once more mobile   Acute hypoxic respiratory failure likely secondary to atelectasis.  PNA unlikely in setting of levofloxacin use - Incentive spirometry  - OOB and PT -  Wean oxygen as tolerated  Hypotension -  Minimize IV pain medication -  cortisol level 13, wnl  CAD with new left bundle branch block, no chest pain, Discussed the case with cardiology who recommended against further testing such as ECHO, stress.  Telemetry demonstrated NSR.   -  Blood pressure too low for BB and probably would not get much benefit from statin long term given comorbities, plus the side effects of muscle weakness and clouded thinking may outweigh the benefits  GERD, stable, continue PPI   Hypothyroidism, TSH 2.09, continue synthroid   Arthritis and osteoporosis  - Pain management  - Would try to minimize use of PPI if possible  - Defer further management to primary care Doctor   Leukocytosis may be reactive secondary to fall and fracture, dehydration, improving - Chest x-ray negative for pneumonia  - urine culture neg  Normocytic anemia, likely secondary to anemia of chronic disease, hgb stable  Thrombocytopenia, acute phase reactant, stable  Hyponatremia and elevated BUN to creatinine ratio likely secondary to dehydration, not eating - continue IV fluids  - Repeat BMP in a.m.   Stage IV ulcer of left buttock with packing  - Levofloxacin started 7/17 to complete a 2 week course - Wound care consult  - Air mattress   Severe protein calorie malnutrition -  Continue liberalized diet and supplements  Constipation,  usually has diarrhea, no BMs recorded since admission however -  Start colace -  miralax prn  Diet: regular  Access: PIV  IVF: Yes  Proph: lovenox   Code Status: DO NOT RESUSCITATE  Family Communication: Patient and her son Disposition Plan:  PT/OT recommending SNF.  SW consulted.  Fair to poor prognosis.  She may benefit from palliative care following while at SNF   Consultants:  Gaynelle Arabian, Orthopedics  Procedures:  CXR  XR knee right  XR hip right  Antibiotics:  Levofloxacin 7/17   HPI/Subjective:  Right leg feeling a little better today.  Tired.  States she is breathing okay.  Denies nausea and vomiting.  Feels very weak and required but up in chair today.    Objective: Filed Vitals:   10/02/13 1429 10/02/13 2040 10/03/13 0446 10/03/13 0513  BP: 103/49 108/50 90/47   Pulse: 95 101 107   Temp: 98.5 F (36.9 C) 98.1 F (36.7 C) 99.6 F (37.6 C)   TempSrc: Oral Oral Oral   Resp: 20 20 18    Height:      Weight:      SpO2: 90% 91% 88% 94%    Intake/Output Summary (Last 24 hours) at 10/03/13 1255 Last data filed at 10/03/13 0300  Gross per 24 hour  Intake 1451.25 ml  Output    915 ml  Net 536.25 ml   Filed Weights   10/01/13 1551  Weight: 62.596 kg (138 lb)    Exam:   General:  Cachectic CF, No acute distress  HEENT:  NCAT, MMM  Cardiovascular:  RRR, nl S1, S2 no mrg, 2+ pulses, warm extremities  Respiratory:  Rales anteriorly bilaterally, no increased WOB  Abdomen:   NABS, soft, NT/ND  MSK:   Normal tone and bulk, no LEE.  Right leg in knee immobilizer which was not removed today.  < 2 sec CR, 2+ pedal pulse.    Neuro:  Grossly intact  Data Reviewed: Basic Metabolic Panel:  Recent Labs Lab 10/01/13 1211 10/02/13 0419 10/03/13 0527  NA 134* 133* 136*  K 3.9 3.9 4.5  CL 95* 95* 101  CO2 26 27 26   GLUCOSE 143* 187* 122*  BUN 30* 36* 38*  CREATININE 0.97 0.95 0.98  CALCIUM 9.1 8.6 8.7   Liver Function Tests: No results  found for this basename: AST, ALT, ALKPHOS, BILITOT, PROT, ALBUMIN,  in the last 168 hours No results found for this basename: LIPASE, AMYLASE,  in the last 168 hours No results found for this basename: AMMONIA,  in the last 168 hours CBC:  Recent Labs Lab 10/01/13 1211 10/02/13 0419 10/03/13 0527  WBC 21.2* 16.1* 11.6*  NEUTROABS 18.5*  --   --   HGB 11.0* 9.8* 9.4*  HCT 32.4* 29.0* 28.0*  MCV 99.1 99.3 99.6  PLT 115* 105* 107*   Cardiac Enzymes:  Recent Labs Lab 10/01/13 1244 10/01/13 1708 10/01/13 2154 10/02/13 0419  TROPONINI <0.30 <0.30 <0.30 <0.30   BNP (last 3 results) No results found for this basename: PROBNP,  in the last 8760 hours CBG: No results found for this basename: GLUCAP,  in the last 168 hours  Recent Results (from the past 240 hour(s))  URINE CULTURE     Status: None   Collection Time    10/01/13  4:59 PM      Result Value Ref Range Status   Specimen Description URINE, CATHETERIZED   Final   Special Requests NONE   Final   Culture  Setup Time     Final   Value: 10/01/2013 22:09     Performed at [facility] Count     Final   Value: NO GROWTH     Performed at [facility]   Culture     Final   Value: NO GROWTH     Performed at [facility]   Report Status 10/02/2013 FINAL   Final  MRSA PCR SCREENING     Status: None   Collection Time    10/01/13  6:45 PM      Result Value Ref Range Status   MRSA by PCR NEGATIVE  NEGATIVE Final   Comment:            The GeneXpert MRSA Assay (FDA     approved for NASAL specimens     only), is one component of a     comprehensive MRSA colonization     surveillance program. It is not     intended to diagnose MRSA     infection nor to guide or     monitor treatment for     MRSA infections.     Studies: Dg Chest 2 View  10/01/2013   CLINICAL DATA:  Mid chest pain and discomfort prior to and since a fall this morning, history coronary artery disease  EXAM: CHEST  2 VIEW   COMPARISON:  05/27/2013  FINDINGS: Enlargement of cardiac silhouette.  Tortuous aorta.  Mediastinal contours and pulmonary vascularity normal.  Rotation to the RIGHT.  Elevation of LEFT diaphragm unchanged.  LEFT basilar atelectasis and small LEFT pleural effusion, chronic.  No definite acute infiltrate or pneumothorax.  Bones demineralized with BILATERAL glenohumeral degenerative changes, prior vertebroplasty of a lower thoracic vertebra, and additional thoracic compression fractures.  IMPRESSION: Enlargement of cardiac silhouette.  Chronic elevation of LEFT diaphragm with LEFT basilar atelectasis and small pleural effusion.  No acute abnormalities.   Electronically Signed   By: Lavonia Dana M.D.   On: 10/01/2013 13:08   Dg Hip Complete Right  10/01/2013   CLINICAL DATA:  [place] in the shower this morning injuring the right hip. Prior right total hip arthroplasty.  EXAM: RIGHT HIP - COMPLETE 2+ VIEW  COMPARISON:  Right hip and AP pelvis x-rays 11/12/2004. CT abdomen and pelvis 08/31/2013.  FINDINGS: Right total hip arthroplasty with anatomic alignment. No radiographic evidence of prosthetic loosening. Severe osseous demineralization, markedly progressive since the prior examination. Age indeterminate fracture involving the right superior pubic ramus, new since the CT 1 month ago.  Included AP pelvis shows a prior left total hip arthroplasty with anatomic alignment. No fractures elsewhere involving the bony pelvis. Degenerative changes involving the visualized lower lumbar spine. Calcification in the right upper pelvis, shown on prior CT to represent a calcified epiploic appendage. Oral contrast material within sigmoid colon diverticula related to the prior CT.  IMPRESSION: 1. Age indeterminate fracture of the right superior pubic ramus, new since the CT 1 month ago. Please correlate with point tenderness. 2. No evidence of acute fracture elsewhere. 3. Severe osseous demineralization. 4. Prior bilateral total hip  arthroplasties with anatomic alignment.   Electronically Signed   By: Evangeline Dakin M.D.   On: 10/01/2013 13:15   Dg Knee Complete 4 Views Right  10/01/2013   CLINICAL DATA:  RIGHT hip and knee pain, fell from shower this morning  EXAM: RIGHT KNEE - COMPLETE 4+ VIEW  COMPARISON:  None  FINDINGS: Severe osseous demineralization.  Hip joint imaged separately, excluded.  Large knee joint effusion.  Medial compartment joint space narrowing RIGHT knee with chondrocalcinosis question CPPD.  Minimally displaced fracture at medial tibial plateau likely extending into tibial spine region.  Additional chronic calcification medial to the medial tibial plateau likely from remote MCL injury.  No additional fracture dislocation.  IMPRESSION: Marked osseous demineralization.  Minimally displaced fracture at the medial tibial plateau likely extending into the tibial spine region, associated with large RIGHT knee joint effusion.   Electronically Signed   By: Lavonia Dana M.D.   On: 10/01/2013 13:11    Scheduled Meds: . aspirin EC  81 mg Oral Daily  . clonazePAM  0.5 mg Oral QHS  . enoxaparin (LOVENOX) injection  30 mg Subcutaneous Q24H  . feeding supplement (ENSURE COMPLETE)  237 mL Oral TID BM  . levofloxacin  250 mg Oral Daily  . levothyroxine  25 mcg Oral QAC breakfast  . multivitamin-lutein  1 capsule Oral BID  . pantoprazole  40 mg Oral Daily  . polyvinyl alcohol  1 drop Both Eyes BID  . potassium chloride  10 mEq Oral q morning - 10a  . sodium chloride  3 mL Intravenous Q12H   Continuous Infusions:    Active Problems:   Fracture, tibial plateau   Right medial tibial plateau fracture    Time spent: 30 min    Cyrene Gharibian, [place]  [facility] Pager [phone]. If 7PM-7AM, please contact night-coverage at [email or web address], password [facility] 10/03/2013, 12:55 PM  LOS: 2 days

## 2013-10-03 NOTE — Progress Notes (Signed)
[name]  MRN: [id number] DOB/Age: 10-23-1915 80 y.o. Physician: Ander Slade, M.D.      Subjective: C/o generalized discomfort, asking if she can sit in a bedside chair today. Vital Signs Temp:  [98.1 F (36.7 C)-99.6 F (37.6 C)] 99.6 F (37.6 C) (07/19 0446) Pulse Rate:  [95-107] 107 (07/19 0446) Resp:  [18-20] 18 (07/19 0446) BP: (90-108)/(47-50) 90/47 mmHg (07/19 0446) SpO2:  [88 %-94 %] 94 % (07/19 0513)  Lab Results  Recent Labs  10/02/13 0419 10/03/13 0527  WBC 16.1* 11.6*  HGB 9.8* 9.4*  HCT 29.0* 28.0*  PLT 105* 107*   BMET  Recent Labs  10/02/13 0419 10/03/13 0527  NA 133* 136*  K 3.9 4.5  CL 95* 101  CO2 27 26  GLUCOSE 187* 122*  BUN 36* 38*  CREATININE 0.95 0.98  CALCIUM 8.6 8.7   INR  Date Value Ref Range Status  06/15/2013 1.15  0.00 - 1.49 Final     Exam  Knee immobilizer in place. Removed for skin check and all skin intact, will ask orthotech to adjust and pad.  Plan Continue immobilization RLE, TDWB for transfers, OOB to chair today.  [name] M 10/03/2013, 9:08 AM

## 2013-10-04 ENCOUNTER — Other Ambulatory Visit: Payer: Self-pay | Admitting: *Deleted

## 2013-10-04 DIAGNOSIS — I447 Left bundle-branch block, unspecified: Secondary | ICD-10-CM

## 2013-10-04 DIAGNOSIS — D696 Thrombocytopenia, unspecified: Secondary | ICD-10-CM

## 2013-10-04 DIAGNOSIS — D72829 Elevated white blood cell count, unspecified: Secondary | ICD-10-CM

## 2013-10-04 DIAGNOSIS — W19XXXA Unspecified fall, initial encounter: Secondary | ICD-10-CM

## 2013-10-04 DIAGNOSIS — K5909 Other constipation: Secondary | ICD-10-CM

## 2013-10-04 DIAGNOSIS — Z5189 Encounter for other specified aftercare: Secondary | ICD-10-CM

## 2013-10-04 DIAGNOSIS — J9811 Atelectasis: Secondary | ICD-10-CM

## 2013-10-04 DIAGNOSIS — L8994 Pressure ulcer of unspecified site, stage 4: Secondary | ICD-10-CM

## 2013-10-04 LAB — BASIC METABOLIC PANEL
ANION GAP: 9 (ref 5–15)
BUN: 41 mg/dL — ABNORMAL HIGH (ref 6–23)
CALCIUM: 9.1 mg/dL (ref 8.4–10.5)
CHLORIDE: 99 meq/L (ref 96–112)
CO2: 28 meq/L (ref 19–32)
Creatinine, Ser: 0.88 mg/dL (ref 0.50–1.10)
GFR calc Af Amer: 63 mL/min — ABNORMAL LOW (ref >90)
GFR calc non Af Amer: 54 mL/min — ABNORMAL LOW (ref >90)
GLUCOSE: 101 mg/dL — AB (ref 70–99)
Potassium: 5 mEq/L (ref 3.7–5.3)
SODIUM: 136 meq/L — AB (ref 137–147)

## 2013-10-04 LAB — CBC
HEMATOCRIT: 30.4 % — AB (ref 36.0–46.0)
HEMOGLOBIN: 9.9 g/dL — AB (ref 12.0–15.0)
MCH: 33.1 pg (ref 26.0–34.0)
MCHC: 32.6 g/dL (ref 30.0–36.0)
MCV: 101.7 fL — AB (ref 78.0–100.0)
Platelets: 125 10*3/uL — ABNORMAL LOW (ref 150–400)
RBC: 2.99 MIL/uL — AB (ref 3.87–5.11)
RDW: 14.9 % (ref 11.5–15.5)
WBC: 11 10*3/uL — AB (ref 4.0–10.5)

## 2013-10-04 MED ORDER — OXYCODONE-ACETAMINOPHEN 5-325 MG PO TABS: ORAL_TABLET | ORAL | Status: AC

## 2013-10-04 MED ORDER — CLONAZEPAM 0.5 MG PO TABS: 0.5000 mg | ORAL_TABLET | Freq: Every day | ORAL | Status: DC

## 2013-10-04 MED ORDER — DSS 100 MG PO CAPS: 100.0000 mg | ORAL_CAPSULE | Freq: Two times a day (BID) | ORAL | Status: AC

## 2013-10-04 MED ORDER — ENOXAPARIN SODIUM 30 MG/0.3ML ~~LOC~~ SOLN: 30.0000 mg | SUBCUTANEOUS | Status: AC

## 2013-10-04 MED ORDER — ASPIRIN 81 MG PO TBEC: 81.0000 mg | DELAYED_RELEASE_TABLET | Freq: Every day | ORAL | Status: AC

## 2013-10-04 MED ORDER — PROSIGHT PO TABS
1.0000 | ORAL_TABLET | Freq: Two times a day (BID) | ORAL | Status: DC
  Administered 2013-10-04: 1 via ORAL
  Filled 2013-10-04 (×2): qty 1

## 2013-10-04 MED ORDER — CLONAZEPAM 0.5 MG PO TABS: 0.5000 mg | ORAL_TABLET | Freq: Every day | ORAL | Status: AC

## 2013-10-04 MED ORDER — ACETAMINOPHEN 325 MG PO TABS: 650.0000 mg | ORAL_TABLET | Freq: Four times a day (QID) | ORAL | Status: AC | PRN

## 2013-10-04 MED ORDER — OXYCODONE-ACETAMINOPHEN 5-325 MG PO TABS: 1.0000 | ORAL_TABLET | ORAL | Status: DC | PRN

## 2013-10-04 NOTE — Consult Note (Signed)
WOC wound consult note Reason for Consult:Stage IV Chronic Pressure Ulcer on the left buttock Wound type:pressure Pressure Ulcer POA: Yes Measurement:0.5cm x 0.5cm x 1cm with 4.2cm undermining from 11 o'clock to 2 o'clock Wound FIE:PPIRJ, pink, moist Drainage (amount, consistency, odor) moderate amount of yellow exudate Periwound:intact, clear Dressing procedure/placement/frequency:I will change the saline packing to a silver hydrofiber in an attempt to better manage exudate and provide antimicrobial coverage to the wound.  This may jump-start the wound healing process and assist the wound in filling in from the bottom before we have closure at the skin level. I have also provided Prevalon Boots today for her continued use in bed (her heels have blanchable erythema, but are at risk) and a pressure redistribution chair pad for her use when OOB in chair. [place] nursing team will not follow, but will remain available to this patient, the nursing and medical team.  Please re-consult if needed. Thanks, Maudie Flakes, MSN, RN, [place], [place], [place] [phone])

## 2013-10-04 NOTE — Progress Notes (Signed)
Report called to RN at [facility]. Hospital course and discharge reviewed. All questions answered. Callie Fielding RN

## 2013-10-04 NOTE — Telephone Encounter (Signed)
[facility] 

## 2013-10-04 NOTE — Progress Notes (Addendum)
   Subjective: She is doing fine this morning.  Patient reports no pain at this time. Patient seen in rounds for Dr. Wynelle Link. Patient is well, and has had no acute complaints or problems Patient is ready to go to the SNF from an orthopedic standpoint.  She will need to remain NWB to the right leg.   Objective: Vital signs in last 24 hours: Temp:  [97.9 F (36.6 C)-98.6 F (37 C)] 98.6 F (37 C) (07/20 0424) Pulse Rate:  [86-93] 86 (07/20 0424) Resp:  [18-24] 18 (07/20 0424) BP: (94-124)/(47-51) 115/51 mmHg (07/20 0424) SpO2:  [93 %-99 %] 99 % (07/20 0424)  Intake/Output from previous day:  Intake/Output Summary (Last 24 hours) at 10/04/13 0714 Last data filed at 10/04/13 0439  Gross per 24 hour  Intake    280 ml  Output    750 ml  Net   -470 ml   Labs:  Recent Labs  10/01/13 1211 10/02/13 0419 10/03/13 0527 10/04/13 0441  HGB 11.0* 9.8* 9.4* 9.9*    Recent Labs  10/03/13 0527 10/04/13 0441  WBC 11.6* 11.0*  RBC 2.81* 2.99*  HCT 28.0* 30.4*  PLT 107* 125*    Recent Labs  10/03/13 0527 10/04/13 0441  NA 136* 136*  K 4.5 5.0  CL 101 99  CO2 26 28  BUN 38* 41*  CREATININE 0.98 0.88  GLUCOSE 122* 101*  CALCIUM 8.7 9.1   No results found for this basename: LABPT, INR,  in the last 72 hours  EXAM: General - Patient is Alert and Appropriate Extremity - Neurovascular intact Sensation intact distally Dorsiflexion/Plantar flexion intact Knee Immobilizer in place.  Opened for skin check.  Skin looks good. Motor Function - intact, moving foot and toes well on exam.   Assessment/Plan: Nondisplaced fracture right tibial spine and medial cortex of proximal right tibia  Past Medical History  Diagnosis Date  . Arthritis   . Coronary artery disease   . GERD (gastroesophageal reflux disease)    Active Problems:   Fracture, tibial plateau   Right medial tibial plateau fracture  Estimated body mass index is 30.96 kg/(m^2) as calculated from the  following:   Height as of this encounter: 4\' 8"  (1.422 m).   Weight as of this encounter: 62.596 kg (138 lb). Up with therapy Discharge to SNF when medically stable  Follow up - in 2 weeks with Dr. Wynelle Link.  Please call for appointment time. Activity - NWB to the right lower extremity.  Do Not Advance Weight Bearing Disposition - Skilled nursing facility  DVT Prophylaxis - Aspirin and Lovenox Pain RX - Percocet RX's placed on chart  Will sign off at this time. Please call for any further questions.  We will see the patient back in our office in two weeks.  Arlee Muslim, PA-C Orthopaedic Surgery 10/04/2013, 7:14 AM

## 2013-10-04 NOTE — Progress Notes (Signed)
Patient is set to discharge to [facility] today. Patient & son, Clare Gandy aware. Discharge packet given to RN, Robert Bellow. [facility] called for transport.   Clinical Social Work Department CLINICAL SOCIAL WORK PLACEMENT NOTE 10/04/2013  Patient:  TALIANA, MERSEREAU  Account Number:  [id number] Admit date:  10/01/2013  Clinical Social Worker:  Kemper Durie, [place]  Date/time:  10/03/2013 04:02 PM  Clinical Social Work is seeking post-discharge placement for this patient at the following level of care:   SKILLED NURSING   (*CSW will update this form in Epic as items are completed)   10/03/2013  Patient/family provided with [facility] Department of Clinical Social Work's list of facilities offering this level of care within the geographic area requested by the patient (or if unable, by the patient's family).  10/03/2013  Patient/family informed of their freedom to choose among providers that offer the needed level of care, that participate in Medicare, Medicaid or managed care program needed by the patient, have an available bed and are willing to accept the patient.  10/03/2013  Patient/family informed of [facility]' ownership interest in [facility], as well as of the fact that they are under no obligation to receive care at this facility.  PASARR submitted to EDS on 10/04/2013 PASARR number received on 10/04/2013  FL2 transmitted to all facilities in geographic area requested by pt/family on  10/03/2013 FL2 transmitted to all facilities within larger geographic area on   Patient informed that his/her managed care company has contracts with or will negotiate with  certain facilities, including the following:     Patient/family informed of bed offers received:  10/04/2013 Patient chooses bed at [place] Physician recommends and patient chooses bed at    Patient to be transferred to [place] on  10/04/2013 Patient to be transferred to facility by [facility] Patient and  family notified of transfer on 10/04/2013 Name of family member notified:  patient's son, Clare Gandy via phone  The following physician request were entered in Epic:   Additional Comments:   Raynaldo Opitz, [place] Social Worker cell #: [phone]

## 2013-10-04 NOTE — Discharge Instructions (Signed)
Tibial Plateau Fracture, Undisplaced, Adult You have a fracture (break in bone) of your tibial plateau. This is a fracture in the upper part of the large "shin" bone (tibia) in your lower leg. The plateau is the top of the bone that butts up against the femur (thigh bone of your upper leg). This is what makes up your knee joint. Because this fracture goes into the knee joint, it is necessary that this fracture be fixed in the best position possible. Otherwise over the years this fracture can cause severe arthritis and marked disability. This may still occur even with the best and ideal treatment. These fractures are easily diagnosed with x-rays. TREATMENT  You have a fracture that may heal without disability and can be treated with immobilization. This means the bone can be held with a cast or splint in a favorable position until your caregiver feels it is stable enough (healed well enough) that you can begin range of motion exercises. These will help keep your knee limber (moving well). HOME CARE INSTRUCTIONS   Apply ice to the injury for 15-20 minutes, 03-04 times per day while awake, for 2 days. Put the ice in a plastic bag and place a thin towel between the bag of ice and your cast.  If you have a plaster or fiberglass cast:  Do not try to scratch the skin under the cast using sharp or pointed objects.  Check the skin around the cast every day. You may put lotion on any red or sore areas.  Keep your cast dry and clean.  If you have a plaster splint:  Wear the splint as directed.  You may loosen the elastic around the splint if your toes become numb, tingle, or turn cold or blue.  Do not put pressure on any part of your cast or splint until it is fully hardened.  Your cast or splint can be protected during bathing with a plastic bag. Do not lower the cast or splint into water.  Use crutches as directed.  Only take over-the-counter or prescription medicines for pain, discomfort, or  fever as directed by your caregiver.  See your caregiver as directed. It is very important to keep all follow-up referrals and appointments in order to avoid any long-term problems with your knee including chronic pain, inability to move the ankle normally, and permanent disability. SEEK IMMEDIATE MEDICAL CARE IF:   Pain is becoming worse rather than better, or if pain is uncontrolled with medications.  You have increased swelling or redness in the foot.  You begin to lose feeling in your foot or toes.  You develop a cold or blue foot or toes on the injured side.  You develop severe pain in your injured leg. Document Released: 12/12/2004 Document Revised: 05/27/2011 Document Reviewed: 01/17/2007 [facility] Patient Information 2015 [place], [place]. This information is not intended to replace advice given to you by your health care provider. Make sure you discuss any questions you have with your health care provider.

## 2013-10-04 NOTE — Discharge Summary (Addendum)
Physician Discharge Summary  [name] PZW:[id number] DOB: 08/29/1915 DOA: 10/01/2013  PCP: Reymundo Poll, MD  Admit date: 10/01/2013 Discharge date: 10/04/2013  Recommendations for Outpatient Follow-up:  1. Transfer to SNF for ongoing PT/OT 2. Orthopedics, Dr. Wynelle Link, follow up in 2 weeks.  Please continue lovenox 30mg  subcutaneous injection once daily for the next 7 days, through 7/26, then stop.   3. Prevalon boots and air mattress.   4. Palliative care to follow 5. Continue levofloxacin through 7/30, then stop 6. Repeat BMP and CBC in 1 week  Discharge Diagnoses:  Principal Problem:   Right medial tibial plateau fracture Active Problems:   CAD   Esophageal reflux   Anemia   Physical deconditioning   Fracture, tibial plateau   Atelectasis   New left bundle branch block (LBBB)   Leukocytosis, unspecified   Thrombocytopenia, unspecified   Pressure ulcer stage IV   Fall   Discharge Condition: stable, improved  Diet recommendation: regular  Wt Readings from Last 3 Encounters:  10/01/13 62.596 kg (138 lb)  08/19/13 60.782 kg (134 lb)  06/15/13 49.896 kg (110 lb)   Wound care:    Left buttock ulcer:  Cleanse with NS, pat dry.  Fill defect with ribbon of silver hydrofiber (Aquacel Ag+).  Leave a 1/2 inch of ribbon protruding from the wound and cover with dry gauze 4x4s and secure with tape.  Change daily.     History of present illness:  The patient is a 80 y.o. 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 The patient was last at their baseline health 4 days ago. For the last 4 days, she has felt more fatigued than usual and had some nausea particularly in the mornings. She has chronic diarrhea and has not noticed any change to her diarrhea. She denies chest pain or abdominal pain, fevers, dysuria. She  states that she became progressively more weak a.m. was having difficulty ambulating for the last 2 days. Normally she ambulates with a rolling walker Alban Marucci distances, but she had difficulty even standing up since yesterday. This morning, she was standing up after taking a shower, and she felt that her legs were weak. Her legs gave out and she fell forward onto her knees, particularly her right knee which twisted somewhat. She had immediate pain in the right knee and had difficulty bearing weight on that leg. She was put back into bed. A wound care nurse which tracks on her stage IV buttock wound, referred her to the emergency department for evaluation.   In the emergency department, her vital signs were notable for mildly low blood pressure 99/44, oxygen saturation 86% on room air, other vital signs within normal limits. Labs notable for white blood cell count 21.2, hemoglobin 11, platelets 1:15, sodium 134, BUN 30, creatinine 0.97. Her initial point-of-care troponin was 0.13 but followup troponin I was less than 0.3. EKG with normal sinus rhythm, new left bundle branch block compared to prior ECG, and subtly inverted T waves in V4 through V6. Chest x-ray without acute abnormality. X-ray of the right knee with minimally displaced fracture at the medial tibial plateau extending into the tibial spine region associated with a large joint effusion. Her hip x-ray showed an indeterminate age fracture of the right superior pubic ramus new since her CT one month ago. No other acute fractures noted. All  of her x-rays demonstrated severe osseous demineralization.   Hospital Course:   Right tibial plateau fracture and right superior pubic rami fracture. She was seen by orthopedics, Doctor Aluisio, who recommended nonoperative management.  She was placed in a knee immobilizer which should be worn at all times except when in physical therapy. She should be nonweightbearing in the right leg. She may continue Percocet as  needed, used judiciously.  Recommend giving a dose of Percocet prior to transfers or physical therapy.  She should followup with orthopedic surgery in 2 weeks.  Acute hypoxic respiratory failure secondary to atelectasis. Pneumonia it seems unlikely in the setting of levofloxacin use. She was given incentive spirometry and encouraged to be out of bed as much as possible.    Hypotension, likely secondary to a combination of pain medication and acute illness. No signs of sepsis. Her cortisol level was 13, within normal limits.  Recommend discontinuing blood pressure medications.  Coronary artery disease with new left bundle branch block, no chest pain. The case was discussed with cardiology who recommended against further testing such as echocardiogram, stress test, or cardiac catheterization given her age and comorbidities. She was placed on telemetry which demonstrated normal sinus rhythm. Her blood pressure was too low for beta blocker and she would probably not get much benefit in the long term from statin administration, especially given the side effects of muscle weakness and clouded thinking.  GERD, stable, continued PPI.  Hypothyroidism, TSH 2.09, continued Synthroid.  Arthritis and osteoporosis, continue pain management. Defer further management to her primary care doctor. If she does not have severe acid reflux, could consider decreasing the use of PPI although at this point, given the severity of her osteoporosis I am not sure that this is very useful.  Leukocytosis likely reactive secondary to fall and fracture and dehydration. Improved somewhat. Her chest x-ray was negative for pneumonia and her urine culture was negative.  Normocytic anemia, likely secondary to anemia of chronic disease, hemoglobin stable.  Thrombocytopenia, acute phase reactant and stable. Recommend repeat CBC in one week.  Hyponatremia and elevated BUN to creatinine ratio likely secondary to dehydration and not  eating. She was temporarily given IV fluids. Recommend repeating a BMP in approximately one week.  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 She is followed by wound care.  0.5cm x 0.5cm x 1cm witih 4.2cm undermining from 11 o'clock to 2-o'clock.  Use silver hydrofiber to better manage exudate and provide antimicrobial coverage.  Per her previous wound care team (prior to admission), she was started on levofloxacin for a 2 week course to see if that would help healing of the ulcer.  No signs of infection were present at the time of admission.  See above for wound care details.  Prevalon boots and air mattress to prevent additional ulcers.    Severe protein calorie malnutrition, liberalize diet and added supplements.  Constipation, usually has diarrhea, no bowel movement recorded since admission. Continue Colace and MiraLax.  Consultants:  Gaynelle Arabian, Orthopedics Procedures:  CXR  XR knee right  XR hip right Antibiotics:  Levofloxacin 7/17   Discharge Exam: Filed Vitals:   10/04/13 0424  BP: 115/51  Pulse: 86  Temp: 98.6 F (37 C)  Resp: 18   Filed Vitals:   10/03/13 0513 10/03/13 1434 10/03/13 2102 10/04/13 0424  BP:  94/49 124/47 115/51  Pulse:  88 93 86  Temp:  97.9 F (36.6 C) 98.4 F (36.9 C) 98.6 F (37 C)  TempSrc:  Oral Oral Oral  Resp:  18 24 18   Height:      Weight:      SpO2: 94% 93% 98% 99%    General: Cachectic CF, No acute distress, lying in bed, frail appearing HEENT: NCAT, MMM  Cardiovascular: RRR, nl S1, S2 no mrg, 2+ pulses, warm extremities  Respiratory: Rales and wheezes anteriorly bilaterally, no increased WOB  Abdomen: NABS, soft, NT/ND  MSK: Normal tone and bulk, no LEE. Right leg swollen and TTP along tibial plateau.  Knee immobilizer in place. < 2 sec CR, 2+ pedal pulse.  Neuro: Grossly intact   Discharge Instructions      Discharge Instructions   Diet general    Complete by:  As directed      Increase activity slowly     Complete by:  As directed      Non weight bearing    Complete by:  As directed             Medication List    STOP taking these medications       hydrochlorothiazide 12.5 MG capsule  Commonly known as:  MICROZIDE     potassium chloride 10 MEQ tablet  Commonly known as:  K-DUR      TAKE these medications       acetaminophen 325 MG tablet  Commonly known as:  TYLENOL  Take 2 tablets (650 mg total) by mouth every 6 (six) hours as needed for mild pain (or Fever >/= 101).     aspirin 81 MG EC tablet  Take 1 tablet (81 mg total) by mouth daily.     cholestyramine 4 G packet  Commonly known as:  QUESTRAN  Take 4 g by mouth daily as needed (for loose stools).     clindamycin 300 MG capsule  Commonly known as:  CLEOCIN  Take 600 mg by mouth See admin instructions. Takes 600mg  as needed 1-hour prior to any scheduled dental appointment     clonazePAM 0.5 MG tablet  Commonly known as:  KLONOPIN  Take 1 tablet (0.5 mg total) by mouth at bedtime.     DSS 100 MG Caps  Take 100 mg by mouth 2 (two) times daily.     enoxaparin 30 MG/0.3ML injection  Commonly known as:  LOVENOX  Inject 0.3 mLs (30 mg total) into the skin daily. Lovenox injection daily for one more week and then discontinue.     feeding supplement (ENSURE COMPLETE) Liqd  Take 237 mLs by mouth 3 (three) times daily between meals.     hydrocortisone cream 0.5 %  Apply 1 application topically as needed for itching. Apply to forehead     hydroxypropyl methylcellulose 2.5 % ophthalmic solution  Commonly known as:  ISOPTO TEARS  Place 1 drop into both eyes 2 (two) times daily.     ICY HOT PAIN RELIEVING 2.5 % Gel  Generic drug:  Menthol (Topical Analgesic)  Apply 1 application topically as needed (for lower back pain).     levofloxacin 250 MG tablet  Commonly known as:  LEVAQUIN  Take 250 mg by mouth daily. Takes for 2 weeks     levothyroxine 25 MCG tablet  Commonly known as:  SYNTHROID, LEVOTHROID  Take 25  mcg by mouth daily before breakfast.     magnesium oxide 400 MG tablet  Commonly known as:  MAG-OX  Take 400 mg by mouth daily.     OCUSOFT LID SCRUB Pads  Place 1 each into both  eyes 2 (two) times daily.     omeprazole 40 MG capsule  Commonly known as:  PRILOSEC  Take 1 capsule (40 mg total) by mouth every morning.     ondansetron 4 MG tablet  Commonly known as:  ZOFRAN  Take 4 mg by mouth 2 (two) times daily as needed for nausea or vomiting.     oxyCODONE-acetaminophen 5-325 MG per tablet  Commonly known as:  PERCOCET/ROXICET  Take 1-2 tablets by mouth every 4 (four) hours as needed for moderate pain or severe pain (please administer prior to PT or OT or painful procedure).     polyethylene glycol packet  Commonly known as:  MIRALAX / GLYCOLAX  Take 17 g by mouth daily as needed for mild constipation.     THEREMS M PO  Take 1 tablet by mouth daily with breakfast.     ICAPS Tabs  Take 1 tablet by mouth 2 (two) times daily.       Follow-up Information   Follow up with Gearlean Alf, MD. Schedule an appointment as soon as possible for a visit in 2 weeks. (Please call the office to setup appointment for the patient.)    Specialty:  Orthopedic Surgery   Contact information:   [address] [place] [address] [place] [address] [phone]        The results of significant diagnostics from this hospitalization (including imaging, microbiology, ancillary and laboratory) are listed below for reference.    Significant Diagnostic Studies: Dg Chest 2 View  10/01/2013   CLINICAL DATA:  Mid chest pain and discomfort prior to and since a fall this morning, history coronary artery disease  EXAM: CHEST  2 VIEW  COMPARISON:  05/27/2013  FINDINGS: Enlargement of cardiac silhouette.  Tortuous aorta.  Mediastinal contours and pulmonary vascularity normal.  Rotation to the RIGHT.  Elevation of LEFT diaphragm unchanged.  LEFT basilar atelectasis and small LEFT pleural effusion,  chronic.  No definite acute infiltrate or pneumothorax.  Bones demineralized with BILATERAL glenohumeral degenerative changes, prior vertebroplasty of a lower thoracic vertebra, and additional thoracic compression fractures.  IMPRESSION: Enlargement of cardiac silhouette.  Chronic elevation of LEFT diaphragm with LEFT basilar atelectasis and small pleural effusion.  No acute abnormalities.   Electronically Signed   By: Lavonia Dana M.D.   On: 10/01/2013 13:08   Dg Hip Complete Right  10/01/2013   CLINICAL DATA:  [place] in the shower this morning injuring the right hip. Prior right total hip arthroplasty.  EXAM: RIGHT HIP - COMPLETE 2+ VIEW  COMPARISON:  Right hip and AP pelvis x-rays 11/12/2004. CT abdomen and pelvis 08/31/2013.  FINDINGS: Right total hip arthroplasty with anatomic alignment. No radiographic evidence of prosthetic loosening. Severe osseous demineralization, markedly progressive since the prior examination. Age indeterminate fracture involving the right superior pubic ramus, new since the CT 1 month ago.  Included AP pelvis shows a prior left total hip arthroplasty with anatomic alignment. No fractures elsewhere involving the bony pelvis. Degenerative changes involving the visualized lower lumbar spine. Calcification in the right upper pelvis, shown on prior CT to represent a calcified epiploic appendage. Oral contrast material within sigmoid colon diverticula related to the prior CT.  IMPRESSION: 1. Age indeterminate fracture of the right superior pubic ramus, new since the CT 1 month ago. Please correlate with point tenderness. 2. No evidence of acute fracture elsewhere. 3. Severe osseous demineralization. 4. Prior bilateral total hip arthroplasties with anatomic alignment.   Electronically Signed   By: Evangeline Dakin  M.D.   On: 10/01/2013 13:15   Dg Knee Complete 4 Views Right  10/01/2013   CLINICAL DATA:  RIGHT hip and knee pain, fell from shower this morning  EXAM: RIGHT KNEE - COMPLETE 4+  VIEW  COMPARISON:  None  FINDINGS: Severe osseous demineralization.  Hip joint imaged separately, excluded.  Large knee joint effusion.  Medial compartment joint space narrowing RIGHT knee with chondrocalcinosis question CPPD.  Minimally displaced fracture at medial tibial plateau likely extending into tibial spine region.  Additional chronic calcification medial to the medial tibial plateau likely from remote MCL injury.  No additional fracture dislocation.  IMPRESSION: Marked osseous demineralization.  Minimally displaced fracture at the medial tibial plateau likely extending into the tibial spine region, associated with large RIGHT knee joint effusion.   Electronically Signed   By: Lavonia Dana M.D.   On: 10/01/2013 13:11    Microbiology: Recent Results (from the past 240 hour(s))  URINE CULTURE     Status: None   Collection Time    10/01/13  4:59 PM      Result Value Ref Range Status   Specimen Description URINE, CATHETERIZED   Final   Special Requests NONE   Final   Culture  Setup Time     Final   Value: 10/01/2013 22:09     Performed at [facility] Count     Final   Value: NO GROWTH     Performed at [facility]   Culture     Final   Value: NO GROWTH     Performed at [facility]   Report Status 10/02/2013 FINAL   Final  MRSA PCR SCREENING     Status: None   Collection Time    10/01/13  6:45 PM      Result Value Ref Range Status   MRSA by PCR NEGATIVE  NEGATIVE Final   Comment:            The GeneXpert MRSA Assay (FDA     approved for NASAL specimens     only), is one component of a     comprehensive MRSA colonization     surveillance program. It is not     intended to diagnose MRSA     infection nor to guide or     monitor treatment for     MRSA infections.     Labs: Basic Metabolic Panel:  Recent Labs Lab 10/01/13 1211 10/02/13 0419 10/03/13 0527 10/04/13 0441  NA 134* 133* 136* 136*  K 3.9 3.9 4.5 5.0  CL 95* 95* 101 99  CO2  26 27 26 28   GLUCOSE 143* 187* 122* 101*  BUN 30* 36* 38* 41*  CREATININE 0.97 0.95 0.98 0.88  CALCIUM 9.1 8.6 8.7 9.1   Liver Function Tests: No results found for this basename: AST, ALT, ALKPHOS, BILITOT, PROT, ALBUMIN,  in the last 168 hours No results found for this basename: LIPASE, AMYLASE,  in the last 168 hours No results found for this basename: AMMONIA,  in the last 168 hours CBC:  Recent Labs Lab 10/01/13 1211 10/02/13 0419 10/03/13 0527 10/04/13 0441  WBC 21.2* 16.1* 11.6* 11.0*  NEUTROABS 18.5*  --   --   --   HGB 11.0* 9.8* 9.4* 9.9*  HCT 32.4* 29.0* 28.0* 30.4*  MCV 99.1 99.3 99.6 101.7*  PLT 115* 105* 107* 125*   Cardiac Enzymes:  Recent Labs Lab 10/01/13 1244 10/01/13 1708 10/01/13 2154 10/02/13 0419  TROPONINI <0.30 <0.30 <0.30 <0.30   BNP: BNP (last 3 results) No results found for this basename: PROBNP,  in the last 8760 hours CBG: No results found for this basename: GLUCAP,  in the last 168 hours  Time coordinating discharge: 45 minutes  Signed:  Cadell Gabrielson  [facility] 10/04/2013, 1:44 PM

## 2013-10-04 NOTE — Progress Notes (Signed)
PT Cancellation Note  Patient Details Name: [name] MRN: [id number] DOB: May 20, 1915   Cancelled Treatment:    Reason Eval/Treat Not Completed: Fatigue/lethargy limiting ability to participate Asked RN to premedicate for therapy however upon return to see pt, RN and WOC RN leaving room, and pt reported fatigue and declining therapy.   Elis Sauber,KATHrine E 10/04/2013, 11:14 AM Carmelia Bake, PT, DPT 10/04/2013 Pager: [phone]

## 2013-10-05 ENCOUNTER — Encounter: Payer: Self-pay | Admitting: Adult Health

## 2013-10-05 ENCOUNTER — Non-Acute Institutional Stay (SKILLED_NURSING_FACILITY): Payer: Medicare Other | Admitting: Adult Health

## 2013-10-05 DIAGNOSIS — F419 Anxiety disorder, unspecified: Secondary | ICD-10-CM | POA: Insufficient documentation

## 2013-10-05 DIAGNOSIS — E43 Unspecified severe protein-calorie malnutrition: Secondary | ICD-10-CM

## 2013-10-05 DIAGNOSIS — E039 Hypothyroidism, unspecified: Secondary | ICD-10-CM

## 2013-10-05 DIAGNOSIS — S82141A Displaced bicondylar fracture of right tibia, initial encounter for closed fracture: Secondary | ICD-10-CM

## 2013-10-05 DIAGNOSIS — K5900 Constipation, unspecified: Secondary | ICD-10-CM | POA: Insufficient documentation

## 2013-10-05 DIAGNOSIS — S82109A Unspecified fracture of upper end of unspecified tibia, initial encounter for closed fracture: Secondary | ICD-10-CM

## 2013-10-05 DIAGNOSIS — F411 Generalized anxiety disorder: Secondary | ICD-10-CM

## 2013-10-05 DIAGNOSIS — R5381 Other malaise: Secondary | ICD-10-CM

## 2013-10-05 DIAGNOSIS — L8990 Pressure ulcer of unspecified site, unspecified stage: Secondary | ICD-10-CM

## 2013-10-05 DIAGNOSIS — D649 Anemia, unspecified: Secondary | ICD-10-CM

## 2013-10-05 DIAGNOSIS — L8994 Pressure ulcer of unspecified site, stage 4: Secondary | ICD-10-CM

## 2013-10-05 DIAGNOSIS — I2510 Atherosclerotic heart disease of native coronary artery without angina pectoris: Secondary | ICD-10-CM

## 2013-10-05 DIAGNOSIS — D696 Thrombocytopenia, unspecified: Secondary | ICD-10-CM

## 2013-10-05 DIAGNOSIS — K219 Gastro-esophageal reflux disease without esophagitis: Secondary | ICD-10-CM

## 2013-10-05 NOTE — Progress Notes (Signed)
Patient ID: [name], female   DOB: Aug 14, 1915, 80 y.o.   MRN: [id number]              PROGRESS NOTE  DATE: 10/05/2013  FACILITY: Nursing Home Location: [facility]  LEVEL OF CARE: SNF (31)  Acute Visit  CHIEF COMPLAINT:  Follow-up Hospitalization  HISTORY OF PRESENT ILLNESS: This is a 80 year old female who has been admitted to [facility] on 10/04/13 from [facility]. She fell @ home sustaining a right medial tibial plateau fracture. Ortho recommended non-operative management. She has been admitted for a short-term rehabilitation.   REASSESSMENT OF ONGOING PROBLEM(S):  ORTHOSTATIC HYPOTENSION: The orthostatcic hypotension remains stable.  Pt's HCTZ has been discontinued due to low BPs.  No dizziness or lightheadedness reported. Last BP 140/71  GERD: pt's GERD is stable.  Denies ongoing heartburn, abd. Pain, nausea or vomiting.  Currently on a PPI & tolerates it without any adverse reactions.  HYPOTHYROIDISM: The hypothyroidism remains stable. No complications noted from the medications presently being used.  The patient denies fatigue or constipation. 7/15 TSH 2.090  PAST MEDICAL HISTORY : Reviewed.  No changes/see problem list  CURRENT MEDICATIONS: Reviewed per MAR/see medication list  REVIEW OF SYSTEMS:  GENERAL: no change in appetite, no fatigue, no weight changes, no fever, chills or weakness RESPIRATORY: no cough, SOB, DOE, wheezing, hemoptysis CARDIAC: no chest pain, or palpitations, +edema GI: no abdominal pain, diarrhea, heart burn, nausea or vomiting, +constipation  PHYSICAL EXAMINATION  GENERAL: no acute distress, normal body habitus EYES: conjunctivae normal, sclerae normal, normal eye lids NECK: supple, trachea midline, no neck masses, no thyroid tenderness, no thyromegaly LYMPHATICS: no LAN in the neck, no supraclavicular LAN RESPIRATORY: breathing is even & unlabored, BS CTAB CARDIAC: RRR, no murmur,no extra heart sounds,  BLE edema 2+ GI: abdomen soft, normal BS, no masses, no tenderness, no hepatomegaly, no splenomegaly EXTREMITIES: able to move all 4 extremities; RLE has immobilizer PSYCHIATRIC: the patient is alert & oriented to person, affect & behavior appropriate  LABS/RADIOLOGY: Labs reviewed: Basic Metabolic Panel:  Recent Labs  10/02/13 0419 10/03/13 0527 10/04/13 0441  NA 133* 136* 136*  K 3.9 4.5 5.0  CL 95* 101 99  CO2 27 26 28   GLUCOSE 187* 122* 101*  BUN 36* 38* 41*  CREATININE 0.95 0.98 0.88  CALCIUM 8.6 8.7 9.1   Liver Function Tests:  Recent Labs  12/13/12 0530 05/26/13 0708 08/19/13 1125  AST 26 27 28   ALT 20 13 17   ALKPHOS 65 144* 128*  BILITOT 0.4 0.5 0.5  PROT 5.0* 6.5 6.7  ALBUMIN 2.5* 2.5* 3.0*    Recent Labs  05/26/13 0708  LIPASE 24   CBC:  Recent Labs  12/12/12 0020  05/26/13 0708  10/01/13 1211 10/02/13 0419 10/03/13 0527 10/04/13 0441  WBC 6.1  < > 7.3  < > 21.2* 16.1* 11.6* 11.0*  NEUTROABS 2.8  --  4.4  --  18.5*  --   --   --   HGB 11.5*  < > 12.0  < > 11.0* 9.8* 9.4* 9.9*  HCT 35.5*  < > 36.6  < > 32.4* 29.0* 28.0* 30.4*  MCV 103.2*  < > 99.2  < > 99.1 99.3 99.6 101.7*  PLT 160  < > 245  < > 115* 105* 107* 125*  < > = values in this interval not displayed.   Cardiac Enzymes:  Recent Labs  10/01/13 1708 10/01/13 2154 10/02/13 0419  TROPONINI <0.30 <  0.30 <0.30    EXAM: CHEST  2 VIEW   COMPARISON:  05/27/2013   FINDINGS: Enlargement of cardiac silhouette.   Tortuous aorta.   Mediastinal contours and pulmonary vascularity normal.   Rotation to the RIGHT.   Elevation of LEFT diaphragm unchanged.   LEFT basilar atelectasis and small LEFT pleural effusion, chronic.   No definite acute infiltrate or pneumothorax.   Bones demineralized with BILATERAL glenohumeral degenerative changes, prior vertebroplasty of a lower thoracic vertebra, and additional thoracic compression fractures.   IMPRESSION: Enlargement of  cardiac silhouette.   Chronic elevation of LEFT diaphragm with LEFT basilar atelectasis and small pleural effusion. EXAM: RIGHT KNEE - COMPLETE 4+ VIEW   COMPARISON:  None   FINDINGS: Severe osseous demineralization.   Hip joint imaged separately, excluded.   Large knee joint effusion.   Medial compartment joint space narrowing RIGHT knee with chondrocalcinosis question CPPD.   Minimally displaced fracture at medial tibial plateau likely extending into tibial spine region.   Additional chronic calcification medial to the medial tibial plateau likely from remote MCL injury.   No additional fracture dislocation.   IMPRESSION: Marked osseous demineralization.   Minimally displaced fracture at the medial tibial plateau likely extending into the tibial spine region, associated with large RIGHT knee joint effusion. EXAM: RIGHT HIP - COMPLETE 2+ VIEW   COMPARISON:  Right hip and AP pelvis x-rays 11/12/2004. CT abdomen and pelvis 08/31/2013.   FINDINGS: Right total hip arthroplasty with anatomic alignment. No radiographic evidence of prosthetic loosening. Severe osseous demineralization, markedly progressive since the prior examination. Age indeterminate fracture involving the right superior pubic ramus, new since the CT 1 month ago.   Included AP pelvis shows a prior left total hip arthroplasty with anatomic alignment. No fractures elsewhere involving the bony pelvis. Degenerative changes involving the visualized lower lumbar spine. Calcification in the right upper pelvis, shown on prior CT to represent a calcified epiploic appendage. Oral contrast material within sigmoid colon diverticula related to the prior CT.   IMPRESSION: 1. Age indeterminate fracture of the right superior pubic ramus, new since the CT 1 month ago. Please correlate with point tenderness. 2. No evidence of acute fracture elsewhere. 3. Severe osseous demineralization. 4. Prior bilateral total  hip arthroplasties with anatomic alignment.    ASSESSMENT/PLAN:  Right medial tibial tibial fracture - continue RLE immobilizer @ all times except during physical therapy Physical deconditioning  - for rehabilitation CAD - stable; continue ASA GERD - continue Prilosec Anemia - check CBC Pressure ulcer stage IV - left buttock ulcer with dressing; continue hydrofiber Hypothyroidism - continue Synthroid Severe protein-calorie malnutrition - continue supplementation Constipation - continue Miralax and Colace; add prune juice on breakfast trays daily Anxiety - stable; continue Klonopin   CPT CODE: 95284  Elizandro Laura Vargas - NP [place] [phone]

## 2013-10-06 ENCOUNTER — Non-Acute Institutional Stay (SKILLED_NURSING_FACILITY): Payer: Medicare Other | Admitting: Internal Medicine

## 2013-10-06 DIAGNOSIS — S82131S Displaced fracture of medial condyle of right tibia, sequela: Secondary | ICD-10-CM

## 2013-10-06 DIAGNOSIS — S8290XS Unspecified fracture of unspecified lower leg, sequela: Secondary | ICD-10-CM

## 2013-10-06 DIAGNOSIS — S32591S Other specified fracture of right pubis, sequela: Secondary | ICD-10-CM

## 2013-10-06 DIAGNOSIS — I2510 Atherosclerotic heart disease of native coronary artery without angina pectoris: Secondary | ICD-10-CM

## 2013-10-06 DIAGNOSIS — E039 Hypothyroidism, unspecified: Secondary | ICD-10-CM

## 2013-10-06 DIAGNOSIS — IMO0002 Reserved for concepts with insufficient information to code with codable children: Secondary | ICD-10-CM

## 2013-10-06 NOTE — ED Provider Notes (Signed)
Medical screening examination/treatment/procedure(s) were conducted as a shared visit with non-physician practitioner(s) and myself.  I personally evaluated the patient during the encounter.   EKG Interpretation   Date/Time:  Friday October 01 2013 12:24:42 EDT Ventricular Rate:  84 PR Interval:  168 QRS Duration: 124 QT Interval:  476 QTC Calculation: 563 R Axis:   -69 Text Interpretation:  Sinus rhythm Left bundle branch block Artifact in  lead(s) I III aVR aVL aVF LVH Confirmed by HARRISON  MD, FORREST ([id number]) on  10/01/2013 12:35:08 PM     Pt s/p mechanical fall injuring her knee.  Tibial plateau fracture found on xray.  Admitted for further treatment.     Nicole Rank, MD 10/06/13 [phone]

## 2013-10-08 ENCOUNTER — Non-Acute Institutional Stay (SKILLED_NURSING_FACILITY): Payer: Medicare Other | Admitting: Adult Health

## 2013-10-08 ENCOUNTER — Encounter: Payer: Self-pay | Admitting: Adult Health

## 2013-10-08 DIAGNOSIS — S32599A Other specified fracture of unspecified pubis, initial encounter for closed fracture: Secondary | ICD-10-CM | POA: Insufficient documentation

## 2013-10-08 DIAGNOSIS — I509 Heart failure, unspecified: Secondary | ICD-10-CM

## 2013-10-08 DIAGNOSIS — J189 Pneumonia, unspecified organism: Secondary | ICD-10-CM | POA: Insufficient documentation

## 2013-10-08 NOTE — Progress Notes (Signed)
Patient ID: [name], female   DOB: 11-17-1915, 80 y.o.   MRN: [id number]             PROGRESS NOTE  DATE:  10/08/13  FACILITY: Nursing Home Location: [facility]  LEVEL OF CARE: SNF (31)  Acute Visit  CHIEF COMPLAINT:  Manage Pneumonia  HISTORY OF PRESENT ILLNESS: This is a 80 year old female whose O2 sta went down to 74% while on room air. Chest x-ray shows pneumonia and suggests CHF.    PAST MEDICAL HISTORY : Reviewed.  No changes/see problem list  CURRENT MEDICATIONS: Reviewed per MAR/see medication list  REVIEW OF SYSTEMS:  GENERAL: no change in appetite, no fatigue, no weight changes, no fever, chills or weakness RESPIRATORY: no cough, SOB, DOE, wheezing, hemoptysis CARDIAC: no chest pain, or palpitations, +edema GI: no abdominal pain, diarrhea, heart burn, nausea or vomiting, +constipation  PHYSICAL EXAMINATION  GENERAL: no acute distress, normal body habitus NECK: supple, trachea midline, no neck masses, no thyroid tenderness, no thyromegaly LYMPHATICS: no LAN in the neck, no supraclavicular LAN RESPIRATORY: breathing is even & unlabored, BS crackles on lower lung fields.  CARDIAC: RRR, no murmur,no extra heart sounds, BLE edema 2+ GI: abdomen soft, normal BS, no masses, no tenderness, no hepatomegaly, no splenomegaly EXTREMITIES: able to move all 4 extremities; RLE has immobilizer PSYCHIATRIC: the patient is alert & oriented to person, affect & behavior appropriate  LABS/RADIOLOGY: Labs reviewed: Basic Metabolic Panel:  Recent Labs  10/02/13 0419 10/03/13 0527 10/04/13 0441  NA 133* 136* 136*  K 3.9 4.5 5.0  CL 95* 101 99  CO2 27 26 28   GLUCOSE 187* 122* 101*  BUN 36* 38* 41*  CREATININE 0.95 0.98 0.88  CALCIUM 8.6 8.7 9.1   Liver Function Tests:  Recent Labs  12/13/12 0530 05/26/13 0708 08/19/13 1125  AST 26 27 28   ALT 20 13 17   ALKPHOS 65 144* 128*  BILITOT 0.4 0.5 0.5  PROT 5.0* 6.5 6.7  ALBUMIN 2.5* 2.5* 3.0*     Recent Labs  05/26/13 0708  LIPASE 24   CBC:  Recent Labs  12/12/12 0020  05/26/13 0708  10/01/13 1211 10/02/13 0419 10/03/13 0527 10/04/13 0441  WBC 6.1  < > 7.3  < > 21.2* 16.1* 11.6* 11.0*  NEUTROABS 2.8  --  4.4  --  18.5*  --   --   --   HGB 11.5*  < > 12.0  < > 11.0* 9.8* 9.4* 9.9*  HCT 35.5*  < > 36.6  < > 32.4* 29.0* 28.0* 30.4*  MCV 103.2*  < > 99.2  < > 99.1 99.3 99.6 101.7*  PLT 160  < > 245  < > 115* 105* 107* 125*  < > = values in this interval not displayed.   Cardiac Enzymes:  Recent Labs  10/01/13 1708 10/01/13 2154 10/02/13 0419  TROPONINI <0.30 <0.30 <0.30    EXAM: CHEST  2 VIEW   COMPARISON:  05/27/2013   FINDINGS: Enlargement of cardiac silhouette.   Tortuous aorta.   Mediastinal contours and pulmonary vascularity normal.   Rotation to the RIGHT.   Elevation of LEFT diaphragm unchanged.   LEFT basilar atelectasis and small LEFT pleural effusion, chronic.   No definite acute infiltrate or pneumothorax.   Bones demineralized with BILATERAL glenohumeral degenerative changes, prior vertebroplasty of a lower thoracic vertebra, and additional thoracic compression fractures.   IMPRESSION: Enlargement of cardiac silhouette.   Chronic elevation of LEFT diaphragm with LEFT basilar  atelectasis and small pleural effusion. EXAM: RIGHT KNEE - COMPLETE 4+ VIEW   COMPARISON:  None   FINDINGS: Severe osseous demineralization.   Hip joint imaged separately, excluded.   Large knee joint effusion.   Medial compartment joint space narrowing RIGHT knee with chondrocalcinosis question CPPD.   Minimally displaced fracture at medial tibial plateau likely extending into tibial spine region.   Additional chronic calcification medial to the medial tibial plateau likely from remote MCL injury.   No additional fracture dislocation.   IMPRESSION: Marked osseous demineralization.   Minimally displaced fracture at the medial tibial  plateau likely extending into the tibial spine region, associated with large RIGHT knee joint effusion. EXAM: RIGHT HIP - COMPLETE 2+ VIEW   COMPARISON:  Right hip and AP pelvis x-rays 11/12/2004. CT abdomen and pelvis 08/31/2013.   FINDINGS: Right total hip arthroplasty with anatomic alignment. No radiographic evidence of prosthetic loosening. Severe osseous demineralization, markedly progressive since the prior examination. Age indeterminate fracture involving the right superior pubic ramus, new since the CT 1 month ago.   Included AP pelvis shows a prior left total hip arthroplasty with anatomic alignment. No fractures elsewhere involving the bony pelvis. Degenerative changes involving the visualized lower lumbar spine. Calcification in the right upper pelvis, shown on prior CT to represent a calcified epiploic appendage. Oral contrast material within sigmoid colon diverticula related to the prior CT.   IMPRESSION: 1. Age indeterminate fracture of the right superior pubic ramus, new since the CT 1 month ago. Please correlate with point tenderness. 2. No evidence of acute fracture elsewhere. 3. Severe osseous demineralization. 4. Prior bilateral total hip arthroplasties with anatomic alignment.    ASSESSMENT/PLAN:  Pneumonia - increase Levaquin to 500 mg PO Q D X 10 days, start Mucinex 600 mg PO Q 12 hours X 7 days, albuterol 2.5 mg/86ml 1 neb Q 6AM, 2PM and 10 PM X 7 days the Q 4 hours PRN  CHF - continue Lasix 40 mg PO Q D x 5 days and K-dur 20 meq PO Q D X 5 days; check BMP after  CPT CODE: 16384  Dixon Luczak Vargas - NP [facility] [phone]

## 2013-10-08 NOTE — Progress Notes (Signed)
HISTORY & PHYSICAL  DATE: 10/06/2013   FACILITY: [facility]  LEVEL OF CARE: SNF (31)  ALLERGIES:  Allergies  Allergen Reactions  . Penicillins Other (See Comments)    Childhood allergy; reaction unknown    CHIEF COMPLAINT:  Manage right tibial plateau fracture, CAD and hypothyroidism  HISTORY OF PRESENT ILLNESS: [name] was hospitalized after a fall with right knee pain. After  hospitalization she is admitted to this facility for short-term rehabilitation.  RIGHT TIBIAL PLATEAU FX: X-ray of the right knee showed minimally displaced fracture at the medial tibia plateau and right cheek x-ray showed right superior pubic ramus fracture. Orthopedics recommended nonoperative management. She was placed in the knee immobilizer. She is nonweightbearing in the right leg. She denies right knee pain or right hip pain. She is tolerating the medications without any side effects.  CAD: The angina has been stable. The patient denies dyspnea on exertion, orthopnea, palpitations and paroxysmal nocturnal dyspnea. No complications noted from the medication presently being used. Complains of bilateral lower extremity swelling. Patient was diagnosed with a new left bundle branch block. Cardiology recommended against further testing due to age and comorbidities. She is on medical management only.  HYPOTHYROIDISM: The hypothyroidism remains stable. No complications noted from the medications presently being used.  The patient denies fatigue or constipation.  Last TSH 2.09.  PAST MEDICAL HISTORY :  Past Medical History  Diagnosis Date  . Arthritis   . Coronary artery disease   . GERD (gastroesophageal reflux disease)     PAST SURGICAL HISTORY: Past Surgical History  Procedure Laterality Date  . Hip fracture surgery      x 3  . Orif femur fracture Left 12/13/2012    Procedure: OPEN REDUCTION INTERNAL FIXATION (ORIF) DISTAL FEMUR FRACTURE;  Surgeon:  Mauri Pole, MD;  Location: [facility];  Service: Orthopedics;  Laterality: Left;  . Appendectomy    . Cholecystectomy    . Kyphoplasty      SOCIAL HISTORY:  reports that she has never smoked. She has never used smokeless tobacco. She reports that she does not drink alcohol or use illicit drugs.  FAMILY HISTORY:  Family History  Problem Relation Age of Onset  . Diabetes Maternal Grandmother   . Diabetes Paternal Grandmother     CURRENT MEDICATIONS: Reviewed per MAR/see medication list  REVIEW OF SYSTEMS:  See HPI otherwise 14 point ROS is negative.  PHYSICAL EXAMINATION  VS:  See VS section  GENERAL: no acute distress, normal body habitus EYES: conjunctivae normal, sclerae normal, normal eye lids MOUTH/THROAT: lips without lesions,no lesions in the mouth,tongue is without lesions,uvula elevates in midline NECK: supple, trachea midline, no neck masses, no thyroid tenderness, no thyromegaly LYMPHATICS: no LAN in the neck, no supraclavicular LAN RESPIRATORY: breathing is even & unlabored, BS CTAB CARDIAC: RRR, no murmur,no extra heart sounds, +3 bilateral lower extremity edema GI:  ABDOMEN: abdomen soft, normal BS, no masses, no tenderness  LIVER/SPLEEN: no hepatomegaly, no splenomegaly MUSCULOSKELETAL: HEAD: normal to inspection  EXTREMITIES: LEFT UPPER EXTREMITY: Moderate range of motion, normal strength & tone RIGHT UPPER EXTREMITY:  Moderate range of motion, normal strength & tone LEFT LOWER EXTREMITY:   range of motion unable to perform, decreased strength & tone RIGHT LOWER EXTREMITY: Not tested-in immobilizer PSYCHIATRIC: the patient is alert & oriented to person, affect & behavior appropriate  LABS/RADIOLOGY:  Labs reviewed: Basic Metabolic Panel:  Recent Labs  10/02/13 0419 10/03/13 0527 10/04/13  0441  NA 133* 136* 136*  K 3.9 4.5 5.0  CL 95* 101 99  CO2 27 26 28   GLUCOSE 187* 122* 101*  BUN 36* 38* 41*  CREATININE 0.95 0.98 0.88  CALCIUM 8.6 8.7 9.1     Liver Function Tests:  Recent Labs  12/13/12 0530 05/26/13 0708 08/19/13 1125  AST 26 27 28   ALT 20 13 17   ALKPHOS 65 144* 128*  BILITOT 0.4 0.5 0.5  PROT 5.0* 6.5 6.7  ALBUMIN 2.5* 2.5* 3.0*    Recent Labs  05/26/13 0708  LIPASE 24   CBC:  Recent Labs  12/12/12 0020  05/26/13 0708  10/01/13 1211 10/02/13 0419 10/03/13 0527 10/04/13 0441  WBC 6.1  < > 7.3  < > 21.2* 16.1* 11.6* 11.0*  NEUTROABS 2.8  --  4.4  --  18.5*  --   --   --   HGB 11.5*  < > 12.0  < > 11.0* 9.8* 9.4* 9.9*  HCT 35.5*  < > 36.6  < > 32.4* 29.0* 28.0* 30.4*  MCV 103.2*  < > 99.2  < > 99.1 99.3 99.6 101.7*  PLT 160  < > 245  < > 115* 105* 107* 125*  < > = values in this interval not displayed.  Cardiac Enzymes:  Recent Labs  10/01/13 1708 10/01/13 2154 10/02/13 0419  TROPONINI <0.30 <0.30 <0.30    CHEST  2 VIEW   COMPARISON:  05/27/2013   FINDINGS: Enlargement of cardiac silhouette.   Tortuous aorta.   Mediastinal contours and pulmonary vascularity normal.   Rotation to the RIGHT.   Elevation of LEFT diaphragm unchanged.   LEFT basilar atelectasis and small LEFT pleural effusion, chronic.   No definite acute infiltrate or pneumothorax.   Bones demineralized with BILATERAL glenohumeral degenerative changes, prior vertebroplasty of a lower thoracic vertebra, and additional thoracic compression fractures.   IMPRESSION: Enlargement of cardiac silhouette.   Chronic elevation of LEFT diaphragm with LEFT basilar atelectasis and small pleural effusion.   No acute abnormalities.     RIGHT KNEE - COMPLETE 4+ VIEW   COMPARISON:  None   FINDINGS: Severe osseous demineralization.   Hip joint imaged separately, excluded.   Large knee joint effusion.   Medial compartment joint space narrowing RIGHT knee with chondrocalcinosis question CPPD.   Minimally displaced fracture at medial tibial plateau likely extending into tibial spine region.   Additional chronic  calcification medial to the medial tibial plateau likely from remote MCL injury.   No additional fracture dislocation.   IMPRESSION: Marked osseous demineralization.   Minimally displaced fracture at the medial tibial plateau likely extending into the tibial spine region, associated with large RIGHT knee joint effusion.     RIGHT HIP - COMPLETE 2+ VIEW   COMPARISON:  Right hip and AP pelvis x-rays 11/12/2004. CT abdomen and pelvis 08/31/2013.   FINDINGS: Right total hip arthroplasty with anatomic alignment. No radiographic evidence of prosthetic loosening. Severe osseous demineralization, markedly progressive since the prior examination. Age indeterminate fracture involving the right superior pubic ramus, new since the CT 1 month ago.   Included AP pelvis shows a prior left total hip arthroplasty with anatomic alignment. No fractures elsewhere involving the bony pelvis. Degenerative changes involving the visualized lower lumbar spine. Calcification in the right upper pelvis, shown on prior CT to represent a calcified epiploic appendage. Oral contrast material within sigmoid colon diverticula related to the prior CT.   IMPRESSION: 1. Age indeterminate fracture of the right superior  pubic ramus, new since the CT 1 month ago. Please correlate with point tenderness. 2. No evidence of acute fracture elsewhere. 3. Severe osseous demineralization. 4. Prior bilateral total hip arthroplasties with anatomic alignment.     ASSESSMENT/PLAN:  Right tibial plateau fracture-continue immobilizer and nonweightbearing. Continue rehabilitation. Right superior pubic ramus fracture-continue rehabilitation CAD-medical management only Hypothyroidism-well-controlled Constipation-Senokot was started Macrocytosis- check RBC folate and vitamin B12 level Anemia-recheck Thrombocytopenia-recheck Leukocytosis-recheck GERD-continue PPI Check CBC and BMP  I have reviewed patient's medical  records received at admission/from hospitalization.  CPT CODE: 25638  [name] Y Geronimo Diliberto, [place] [phone]

## 2013-10-09 DIAGNOSIS — I509 Heart failure, unspecified: Secondary | ICD-10-CM | POA: Insufficient documentation

## 2013-10-29 ENCOUNTER — Non-Acute Institutional Stay (SKILLED_NURSING_FACILITY): Payer: Medicare Other | Admitting: Adult Health

## 2013-10-29 ENCOUNTER — Encounter: Payer: Self-pay | Admitting: Adult Health

## 2013-10-29 DIAGNOSIS — J189 Pneumonia, unspecified organism: Secondary | ICD-10-CM

## 2013-10-29 NOTE — Progress Notes (Signed)
Patient ID: [name], female   DOB: 03-Oct-1915, 80 y.o.   MRN: [id number]             PROGRESS NOTE  DATE:  10/29/13  FACILITY: Nursing Home Location: [facility]  LEVEL OF CARE: SNF (31)  Acute Visit  CHIEF COMPLAINT:  Manage Pneumonia  HISTORY OF PRESENT ILLNESS: This is a 80 year old female who complains of occasional left chest pain. Chest x-ray shows mild patchy left basilar density compatible with pneumonia or atelectasis. No fever reported. Patient reports coughing occasionally. No SOB.   PAST MEDICAL HISTORY : Reviewed.  No changes/see problem list  CURRENT MEDICATIONS: Reviewed per MAR/see medication list  REVIEW OF SYSTEMS:  GENERAL: no change in appetite, no fatigue, no weight changes, no fever, chills or weakness RESPIRATORY: no , SOB, DOE, wheezing, hemoptysis, +cough CARDIAC: no chest pain, or palpitations, +edema GI: no abdominal pain, diarrhea, heart burn, nausea or vomiting  PHYSICAL EXAMINATION  GENERAL: no acute distress, normal body habitus NECK: supple, trachea midline, no neck masses, no thyroid tenderness, no thyromegaly LYMPHATICS: no LAN in the neck, no supraclavicular LAN RESPIRATORY: breathing is even & unlabored, decreased breath sounds on left lung field  CARDIAC: RRR, no murmur,no extra heart sounds, BLE edema,  RLE2+, LLE 1+ GI: abdomen soft, normal BS, no masses, no tenderness, no hepatomegaly, no splenomegaly EXTREMITIES: able to move all 4 extremities PSYCHIATRIC: the patient is alert & oriented to person, affect & behavior appropriate  LABS/RADIOLOGY: 10/22/13  sodium 138 potassium 4.2 glucose 78 BUN 26 creatinine 0.8 calcium 8.6 10/13/13  sodium 137 potassium 4.6 glucose 89 BUN 25 creatinine 0.8 calcium 9.1 vitamin B12 505 folate 1244 10/11/13  sodium 138 potassium 5.0 glucose 96 BUN 27 creatinine 0.8 calcium 9.3 Labs reviewed: Basic Metabolic Panel:  Recent Labs  10/02/13 0419 10/03/13 0527 10/04/13 0441  NA  133* 136* 136*  K 3.9 4.5 5.0  CL 95* 101 99  CO2 27 26 28   GLUCOSE 187* 122* 101*  BUN 36* 38* 41*  CREATININE 0.95 0.98 0.88  CALCIUM 8.6 8.7 9.1   Liver Function Tests:  Recent Labs  12/13/12 0530 05/26/13 0708 08/19/13 1125  AST 26 27 28   ALT 20 13 17   ALKPHOS 65 144* 128*  BILITOT 0.4 0.5 0.5  PROT 5.0* 6.5 6.7  ALBUMIN 2.5* 2.5* 3.0*    Recent Labs  05/26/13 0708  LIPASE 24   CBC:  Recent Labs  12/12/12 0020  05/26/13 0708  10/01/13 1211 10/02/13 0419 10/03/13 0527 10/04/13 0441  WBC 6.1  < > 7.3  < > 21.2* 16.1* 11.6* 11.0*  NEUTROABS 2.8  --  4.4  --  18.5*  --   --   --   HGB 11.5*  < > 12.0  < > 11.0* 9.8* 9.4* 9.9*  HCT 35.5*  < > 36.6  < > 32.4* 29.0* 28.0* 30.4*  MCV 103.2*  < > 99.2  < > 99.1 99.3 99.6 101.7*  PLT 160  < > 245  < > 115* 105* 107* 125*  < > = values in this interval not displayed.   Cardiac Enzymes:  Recent Labs  10/01/13 1708 10/01/13 2154 10/02/13 0419  TROPONINI <0.30 <0.30 <0.30    EXAM: CHEST  2 VIEW   COMPARISON:  05/27/2013   FINDINGS: Enlargement of cardiac silhouette.   Tortuous aorta.   Mediastinal contours and pulmonary vascularity normal.   Rotation to the RIGHT.   Elevation of LEFT diaphragm  unchanged.   LEFT basilar atelectasis and small LEFT pleural effusion, chronic.   No definite acute infiltrate or pneumothorax.   Bones demineralized with BILATERAL glenohumeral degenerative changes, prior vertebroplasty of a lower thoracic vertebra, and additional thoracic compression fractures.   IMPRESSION: Enlargement of cardiac silhouette.   Chronic elevation of LEFT diaphragm with LEFT basilar atelectasis and small pleural effusion. EXAM: RIGHT KNEE - COMPLETE 4+ VIEW   COMPARISON:  None   FINDINGS: Severe osseous demineralization.   Hip joint imaged separately, excluded.   Large knee joint effusion.   Medial compartment joint space narrowing RIGHT knee with chondrocalcinosis  question CPPD.   Minimally displaced fracture at medial tibial plateau likely extending into tibial spine region.   Additional chronic calcification medial to the medial tibial plateau likely from remote MCL injury.   No additional fracture dislocation.   IMPRESSION: Marked osseous demineralization.   Minimally displaced fracture at the medial tibial plateau likely extending into the tibial spine region, associated with large RIGHT knee joint effusion. EXAM: RIGHT HIP - COMPLETE 2+ VIEW   COMPARISON:  Right hip and AP pelvis x-rays 11/12/2004. CT abdomen and pelvis 08/31/2013.   FINDINGS: Right total hip arthroplasty with anatomic alignment. No radiographic evidence of prosthetic loosening. Severe osseous demineralization, markedly progressive since the prior examination. Age indeterminate fracture involving the right superior pubic ramus, new since the CT 1 month ago.   Included AP pelvis shows a prior left total hip arthroplasty with anatomic alignment. No fractures elsewhere involving the bony pelvis. Degenerative changes involving the visualized lower lumbar spine. Calcification in the right upper pelvis, shown on prior CT to represent a calcified epiploic appendage. Oral contrast material within sigmoid colon diverticula related to the prior CT.   IMPRESSION: 1. Age indeterminate fracture of the right superior pubic ramus, new since the CT 1 month ago. Please correlate with point tenderness. 2. No evidence of acute fracture elsewhere. 3. Severe osseous demineralization. 4. Prior bilateral total hip arthroplasties with anatomic alignment.    ASSESSMENT/PLAN:  Pneumonia - start Levaquin to 500 mg PO Q D X 10 days, albuterol 2.5 mg/51ml 1 neb Q 6AM, 2PM and 10 PM X 7 days; Florastor 250 mg PO BID X 13 days; check BMP and CBC   CPT CODE: 11021  Seth Bake - NP [facility] [phone]

## 2013-11-15 ENCOUNTER — Non-Acute Institutional Stay (SKILLED_NURSING_FACILITY): Payer: Medicare Other | Admitting: Adult Health

## 2013-11-15 ENCOUNTER — Encounter: Payer: Self-pay | Admitting: Adult Health

## 2013-11-15 DIAGNOSIS — F411 Generalized anxiety disorder: Secondary | ICD-10-CM

## 2013-11-15 DIAGNOSIS — L8994 Pressure ulcer of unspecified site, stage 4: Secondary | ICD-10-CM

## 2013-11-15 DIAGNOSIS — K5900 Constipation, unspecified: Secondary | ICD-10-CM

## 2013-11-15 DIAGNOSIS — I2510 Atherosclerotic heart disease of native coronary artery without angina pectoris: Secondary | ICD-10-CM

## 2013-11-15 DIAGNOSIS — F419 Anxiety disorder, unspecified: Secondary | ICD-10-CM

## 2013-11-15 DIAGNOSIS — L8990 Pressure ulcer of unspecified site, unspecified stage: Secondary | ICD-10-CM

## 2013-11-15 DIAGNOSIS — S32591S Other specified fracture of right pubis, sequela: Secondary | ICD-10-CM

## 2013-11-15 DIAGNOSIS — E039 Hypothyroidism, unspecified: Secondary | ICD-10-CM

## 2013-11-15 DIAGNOSIS — K219 Gastro-esophageal reflux disease without esophagitis: Secondary | ICD-10-CM

## 2013-11-15 DIAGNOSIS — S82131S Displaced fracture of medial condyle of right tibia, sequela: Secondary | ICD-10-CM

## 2013-11-15 DIAGNOSIS — E43 Unspecified severe protein-calorie malnutrition: Secondary | ICD-10-CM

## 2013-11-15 DIAGNOSIS — D649 Anemia, unspecified: Secondary | ICD-10-CM

## 2013-11-15 DIAGNOSIS — S8290XS Unspecified fracture of unspecified lower leg, sequela: Secondary | ICD-10-CM

## 2013-11-15 DIAGNOSIS — IMO0002 Reserved for concepts with insufficient information to code with codable children: Secondary | ICD-10-CM

## 2013-11-15 NOTE — Progress Notes (Signed)
Patient ID: [name], female   DOB: Feb 09, 1916, 80 y.o.   MRN: [id number]              PROGRESS NOTE  DATE:      11/15/13  FACILITY: Nursing Home Location: [facility]  LEVEL OF CARE: SNF (31)  Routine Visit  CHIEF COMPLAINT:  Manage Hypothyroidism. GERD, CAD and Anxiety  HISTORY OF PRESENT ILLNESS:   REASSESSMENT OF ONGOING PROBLEM(S):  ANEMIA: The anemia has been stable. The patient denies fatigue, melena or hematochezia. No complications from the medications currently being used. 8/15  hgb 11.2  CAD: The angina has been stable. The patient denies dyspnea on exertion, orthopnea, pedal edema, palpitations and paroxysmal nocturnal dyspnea. No complications noted from the medication presently being used.  ANXIETY: The anxiety remains stable. Patient denies ongoing anxiety or irritability. No complications reported from the medications currently being used.  PAST MEDICAL HISTORY : Reviewed.  No changes/see problem list  CURRENT MEDICATIONS: Reviewed per MAR/see medication list  REVIEW OF SYSTEMS:  GENERAL: no change in appetite, no fatigue, no weight changes, no fever, chills or weakness RESPIRATORY: no cough, SOB, DOE, wheezing, hemoptysis CARDIAC: no chest pain, or palpitations, +edema GI: no abdominal pain, diarrhea, heart burn, nausea or vomiting, +constipation  PHYSICAL EXAMINATION  GENERAL: no acute distress, normal body habitus NECK: supple, trachea midline, no neck masses, no thyroid tenderness, no thyromegaly LYMPHATICS: no LAN in the neck, no supraclavicular LAN RESPIRATORY: breathing is even & unlabored, BS CTAB CARDIAC: RRR, no murmur,no extra heart sounds, BLE edema 1+ GI: abdomen soft, normal BS, no masses, no tenderness, no hepatomegaly, no splenomegaly EXTREMITIES: able to move all 4 extremities PSYCHIATRIC: the patient is alert & oriented to person, affect & behavior appropriate  LABS/RADIOLOGY: 11/03/13  sodium 141 potassium 4.4  glucose 102 BUN 30 creatinine 1.0 calcium 9.5 10/30/13  WBC 7.7 hemoglobin 11.2 hematocrit 37.1 10/22/13  sodium 138 potassium 4.2 glucose 78 BUN 26 creatinine 0.8 calcium 8.6 10/13/13 sodium 137 potassium 4.6 glucose 89 BUN 25 creatinine 0.8 calcium was 9.1  vitamin B12 505.0  folate 1244 10/11/13 WBC 7.0 hemoglobin 10.0 hematocrit 32.8 MCV 107.2 Labs reviewed: Basic Metabolic Panel:  Recent Labs  10/02/13 0419 10/03/13 0527 10/04/13 0441  NA 133* 136* 136*  K 3.9 4.5 5.0  CL 95* 101 99  CO2 27 26 28   GLUCOSE 187* 122* 101*  BUN 36* 38* 41*  CREATININE 0.95 0.98 0.88  CALCIUM 8.6 8.7 9.1   Liver Function Tests:  Recent Labs  12/13/12 0530 05/26/13 0708 08/19/13 1125  AST 26 27 28   ALT 20 13 17   ALKPHOS 65 144* 128*  BILITOT 0.4 0.5 0.5  PROT 5.0* 6.5 6.7  ALBUMIN 2.5* 2.5* 3.0*    Recent Labs  05/26/13 0708  LIPASE 24   CBC:  Recent Labs  12/12/12 0020  05/26/13 0708  10/01/13 1211 10/02/13 0419 10/03/13 0527 10/04/13 0441  WBC 6.1  < > 7.3  < > 21.2* 16.1* 11.6* 11.0*  NEUTROABS 2.8  --  4.4  --  18.5*  --   --   --   HGB 11.5*  < > 12.0  < > 11.0* 9.8* 9.4* 9.9*  HCT 35.5*  < > 36.6  < > 32.4* 29.0* 28.0* 30.4*  MCV 103.2*  < > 99.2  < > 99.1 99.3 99.6 101.7*  PLT 160  < > 245  < > 115* 105* 107* 125*  < > = values in  this interval not displayed.   Cardiac Enzymes:  Recent Labs  10/01/13 1708 10/01/13 2154 10/02/13 0419  TROPONINI <0.30 <0.30 <0.30    EXAM: CHEST  2 VIEW   COMPARISON:  05/27/2013   FINDINGS: Enlargement of cardiac silhouette.   Tortuous aorta.   Mediastinal contours and pulmonary vascularity normal.   Rotation to the RIGHT.   Elevation of LEFT diaphragm unchanged.   LEFT basilar atelectasis and small LEFT pleural effusion, chronic.   No definite acute infiltrate or pneumothorax.   Bones demineralized with BILATERAL glenohumeral degenerative changes, prior vertebroplasty of a lower thoracic vertebra,  and additional thoracic compression fractures.   IMPRESSION: Enlargement of cardiac silhouette.   Chronic elevation of LEFT diaphragm with LEFT basilar atelectasis and small pleural effusion. EXAM: RIGHT KNEE - COMPLETE 4+ VIEW   COMPARISON:  None   FINDINGS: Severe osseous demineralization.   Hip joint imaged separately, excluded.   Large knee joint effusion.   Medial compartment joint space narrowing RIGHT knee with chondrocalcinosis question CPPD.   Minimally displaced fracture at medial tibial plateau likely extending into tibial spine region.   Additional chronic calcification medial to the medial tibial plateau likely from remote MCL injury.   No additional fracture dislocation.   IMPRESSION: Marked osseous demineralization.   Minimally displaced fracture at the medial tibial plateau likely extending into the tibial spine region, associated with large RIGHT knee joint effusion. EXAM: RIGHT HIP - COMPLETE 2+ VIEW   COMPARISON:  Right hip and AP pelvis x-rays 11/12/2004. CT abdomen and pelvis 08/31/2013.   FINDINGS: Right total hip arthroplasty with anatomic alignment. No radiographic evidence of prosthetic loosening. Severe osseous demineralization, markedly progressive since the prior examination. Age indeterminate fracture involving the right superior pubic ramus, new since the CT 1 month ago.   Included AP pelvis shows a prior left total hip arthroplasty with anatomic alignment. No fractures elsewhere involving the bony pelvis. Degenerative changes involving the visualized lower lumbar spine. Calcification in the right upper pelvis, shown on prior CT to represent a calcified epiploic appendage. Oral contrast material within sigmoid colon diverticula related to the prior CT.   IMPRESSION: 1. Age indeterminate fracture of the right superior pubic ramus, new since the CT 1 month ago. Please correlate with point tenderness. 2. No evidence of acute  fracture elsewhere. 3. Severe osseous demineralization. 4. Prior bilateral total hip arthroplasties with anatomic alignment.    ASSESSMENT/PLAN:  Right medial tibial tibial fracture - continue rehabilitation Physical deconditioning  - continue rehabilitation CAD - stable; continue ASA GERD - continue Prilosec Anemia - check CBC Pressure ulcer stage IV - left buttock ulcer with dressing; continue calcium alginate Hypothyroidism - continue Synthroid Severe protein-calorie malnutrition - continue supplementation Constipation - continue Miralax and Colace; add prune juice on breakfast trays daily Anxiety - stable; continue Klonopin   CPT CODE: 53664  Cale Decarolis Vargas - NP [place] [phone]

## 2013-11-24 ENCOUNTER — Non-Acute Institutional Stay (SKILLED_NURSING_FACILITY): Payer: Medicare Other | Admitting: Adult Health

## 2013-11-24 ENCOUNTER — Encounter: Payer: Self-pay | Admitting: Adult Health

## 2013-11-24 DIAGNOSIS — R600 Localized edema: Secondary | ICD-10-CM

## 2013-11-24 DIAGNOSIS — R609 Edema, unspecified: Secondary | ICD-10-CM

## 2013-11-24 DIAGNOSIS — R109 Unspecified abdominal pain: Secondary | ICD-10-CM

## 2013-11-24 NOTE — Progress Notes (Signed)
Patient ID: [name], female   DOB: June 30, 1915, 80 y.o.   MRN: [id number]             PROGRESS NOTE  DATE:      11/24/13  FACILITY: Nursing Home Location: [facility]  LEVEL OF CARE: SNF (31)  Acute Visit  CHIEF COMPLAINT:  Manage Bilateral Lower Edema  HISTORY OF PRESENT ILLNESS:  This is a 80 year old female who was noted to have increased in lower extremity edema, 2+. Lasix was recently discontinued. No SOB noted. She complains of abdominal cramping. She reports having regular bowel movements.   PAST MEDICAL HISTORY : Reviewed.  No changes/see problem list  CURRENT MEDICATIONS: Reviewed per MAR/see medication list  REVIEW OF SYSTEMS:  GENERAL: no change in appetite, no fatigue, no weight changes, no fever, chills or weakness RESPIRATORY: no cough, SOB, DOE, wheezing, hemoptysis CARDIAC: no chest pain, or palpitations, +edema GI: no diarrhea, heart burn, nausea or vomiting, +abdominal pain  PHYSICAL EXAMINATION  GENERAL: no acute distress, normal body habitus NECK: supple, trachea midline, no neck masses, no thyroid tenderness, no thyromegaly RESPIRATORY: breathing is even & unlabored, BS CTAB CARDIAC: RRR, no murmur,no extra heart sounds, BLE edema 2+ GI: abdomen soft, normal BS, no masses, no tenderness, no hepatomegaly, no splenomegaly EXTREMITIES: able to move all 4 extremities PSYCHIATRIC: the patient is alert & oriented to person, affect & behavior appropriate  LABS/RADIOLOGY:  11/04/13  sodium 141 potassium 4.4 glucose 102 BUN 30 creatinine 1.0 calcium 9.5 11/03/13  sodium 141 potassium 4.4 glucose 102 BUN 30 creatinine 1.0 calcium 9.5 10/30/13  WBC 7.7 hemoglobin 11.2 hematocrit 37.1 10/22/13  sodium 138 potassium 4.2 glucose 78 BUN 26 creatinine 0.8 calcium 8.6 10/13/13 sodium 137 potassium 4.6 glucose 89 BUN 25 creatinine 0.8 calcium was 9.1  vitamin B12 505.0  folate 1244 10/11/13 WBC 7.0 hemoglobin 10.0 hematocrit 32.8 MCV 107.2 Labs  reviewed: Basic Metabolic Panel:  Recent Labs  10/02/13 0419 10/03/13 0527 10/04/13 0441  NA 133* 136* 136*  K 3.9 4.5 5.0  CL 95* 101 99  CO2 27 26 28   GLUCOSE 187* 122* 101*  BUN 36* 38* 41*  CREATININE 0.95 0.98 0.88  CALCIUM 8.6 8.7 9.1   Liver Function Tests:  Recent Labs  12/13/12 0530 05/26/13 0708 08/19/13 1125  AST 26 27 28   ALT 20 13 17   ALKPHOS 65 144* 128*  BILITOT 0.4 0.5 0.5  PROT 5.0* 6.5 6.7  ALBUMIN 2.5* 2.5* 3.0*    Recent Labs  05/26/13 0708  LIPASE 24   CBC:  Recent Labs  12/12/12 0020  05/26/13 0708  10/01/13 1211 10/02/13 0419 10/03/13 0527 10/04/13 0441  WBC 6.1  < > 7.3  < > 21.2* 16.1* 11.6* 11.0*  NEUTROABS 2.8  --  4.4  --  18.5*  --   --   --   HGB 11.5*  < > 12.0  < > 11.0* 9.8* 9.4* 9.9*  HCT 35.5*  < > 36.6  < > 32.4* 29.0* 28.0* 30.4*  MCV 103.2*  < > 99.2  < > 99.1 99.3 99.6 101.7*  PLT 160  < > 245  < > 115* 105* 107* 125*  < > = values in this interval not displayed.   Cardiac Enzymes:  Recent Labs  10/01/13 1708 10/01/13 2154 10/02/13 0419  TROPONINI <0.30 <0.30 <0.30    EXAM: CHEST  2 VIEW   COMPARISON:  05/27/2013   FINDINGS: Enlargement of cardiac silhouette.  Tortuous aorta.   Mediastinal contours and pulmonary vascularity normal.   Rotation to the RIGHT.   Elevation of LEFT diaphragm unchanged.   LEFT basilar atelectasis and small LEFT pleural effusion, chronic.   No definite acute infiltrate or pneumothorax.   Bones demineralized with BILATERAL glenohumeral degenerative changes, prior vertebroplasty of a lower thoracic vertebra, and additional thoracic compression fractures.   IMPRESSION: Enlargement of cardiac silhouette.   Chronic elevation of LEFT diaphragm with LEFT basilar atelectasis and small pleural effusion. EXAM: RIGHT KNEE - COMPLETE 4+ VIEW   COMPARISON:  None   FINDINGS: Severe osseous demineralization.   Hip joint imaged separately, excluded.   Large knee  joint effusion.   Medial compartment joint space narrowing RIGHT knee with chondrocalcinosis question CPPD.   Minimally displaced fracture at medial tibial plateau likely extending into tibial spine region.   Additional chronic calcification medial to the medial tibial plateau likely from remote MCL injury.   No additional fracture dislocation.   IMPRESSION: Marked osseous demineralization.   Minimally displaced fracture at the medial tibial plateau likely extending into the tibial spine region, associated with large RIGHT knee joint effusion. EXAM: RIGHT HIP - COMPLETE 2+ VIEW   COMPARISON:  Right hip and AP pelvis x-rays 11/12/2004. CT abdomen and pelvis 08/31/2013.   FINDINGS: Right total hip arthroplasty with anatomic alignment. No radiographic evidence of prosthetic loosening. Severe osseous demineralization, markedly progressive since the prior examination. Age indeterminate fracture involving the right superior pubic ramus, new since the CT 1 month ago.   Included AP pelvis shows a prior left total hip arthroplasty with anatomic alignment. No fractures elsewhere involving the bony pelvis. Degenerative changes involving the visualized lower lumbar spine. Calcification in the right upper pelvis, shown on prior CT to represent a calcified epiploic appendage. Oral contrast material within sigmoid colon diverticula related to the prior CT.   IMPRESSION: 1. Age indeterminate fracture of the right superior pubic ramus, new since the CT 1 month ago. Please correlate with point tenderness. 2. No evidence of acute fracture elsewhere. 3. Severe osseous demineralization. 4. Prior bilateral total hip arthroplasties with anatomic alignment.    ASSESSMENT/PLAN:  Bilateral Lower Extremity Edema -  start Lasix 40 mg one tab by mouth daily and K-dur 20 MDQ by mouth daily; check BMP in 1 week Abdominal pain - do KUB   CPT CODE: 97989  Seth Bake - NP [facility] [phone]

## 2013-12-14 ENCOUNTER — Non-Acute Institutional Stay (SKILLED_NURSING_FACILITY): Payer: Medicare Other | Admitting: Adult Health

## 2013-12-14 ENCOUNTER — Encounter: Payer: Self-pay | Admitting: Adult Health

## 2013-12-14 DIAGNOSIS — L8990 Pressure ulcer of unspecified site, unspecified stage: Secondary | ICD-10-CM

## 2013-12-14 DIAGNOSIS — E039 Hypothyroidism, unspecified: Secondary | ICD-10-CM

## 2013-12-14 DIAGNOSIS — E43 Unspecified severe protein-calorie malnutrition: Secondary | ICD-10-CM

## 2013-12-14 DIAGNOSIS — I2510 Atherosclerotic heart disease of native coronary artery without angina pectoris: Secondary | ICD-10-CM

## 2013-12-14 DIAGNOSIS — IMO0002 Reserved for concepts with insufficient information to code with codable children: Secondary | ICD-10-CM

## 2013-12-14 DIAGNOSIS — K5900 Constipation, unspecified: Secondary | ICD-10-CM

## 2013-12-14 DIAGNOSIS — R600 Localized edema: Secondary | ICD-10-CM

## 2013-12-14 DIAGNOSIS — D649 Anemia, unspecified: Secondary | ICD-10-CM

## 2013-12-14 DIAGNOSIS — K219 Gastro-esophageal reflux disease without esophagitis: Secondary | ICD-10-CM

## 2013-12-14 DIAGNOSIS — L8994 Pressure ulcer of unspecified site, stage 4: Secondary | ICD-10-CM

## 2013-12-14 DIAGNOSIS — S8290XS Unspecified fracture of unspecified lower leg, sequela: Secondary | ICD-10-CM

## 2013-12-14 DIAGNOSIS — S32591S Other specified fracture of right pubis, sequela: Secondary | ICD-10-CM

## 2013-12-14 DIAGNOSIS — R609 Edema, unspecified: Secondary | ICD-10-CM

## 2013-12-14 DIAGNOSIS — F411 Generalized anxiety disorder: Secondary | ICD-10-CM

## 2013-12-14 DIAGNOSIS — F419 Anxiety disorder, unspecified: Secondary | ICD-10-CM

## 2013-12-14 DIAGNOSIS — S82131S Displaced fracture of medial condyle of right tibia, sequela: Secondary | ICD-10-CM

## 2013-12-14 NOTE — Progress Notes (Signed)
Patient ID: [name], female   DOB: Aug 01, 1915, 80 y.o.   MRN: [id number]              PROGRESS NOTE  DATE:      12/14/13  FACILITY: Nursing Home Location: [facility]  LEVEL OF CARE: SNF (31)  Routine Visit  CHIEF COMPLAINT:  Manage Hypothyroidism. GERD, CAD and Anxiety  HISTORY OF PRESENT ILLNESS:   REASSESSMENT OF ONGOING PROBLEM(S):  HYPOTHYROIDISM: The hypothyroidism remains stable. No complications noted from the medications presently being used.  The patient denies fatigue or constipation.  7/15 TSH 2.090  GERD: pt's GERD is stable.  Denies ongoing heartburn, abd. Pain, nausea or vomiting.  Currently on a PPI & tolerates it without any adverse reactions.  INSOMNIA: The insomnia remains stable.  No complications noted from the medications presently being used. Patient denies ongoing insomnia, pain, hallucinations, delusions.  PAST MEDICAL HISTORY : Reviewed.  No changes/see problem list  CURRENT MEDICATIONS: Reviewed per MAR/see medication list  REVIEW OF SYSTEMS:  GENERAL: no change in appetite, no fatigue, no weight changes, no fever, chills or weakness RESPIRATORY: no cough, SOB, DOE, wheezing, hemoptysis CARDIAC: no chest pain, or palpitations, +edema GI: no abdominal pain, diarrhea, heart burn, nausea or vomiting, +constipation  PHYSICAL EXAMINATION  GENERAL: no acute distress, normal body habitus EYES:  Lids open and close normally. No blepharitis, entropion or ectropion. NECK: supple, trachea midline, no neck masses, no thyroid tenderness, no thyromegaly LYMPHATICS: no LAN in the neck, no supraclavicular LAN RESPIRATORY: breathing is even & unlabored, BS CTAB CARDIAC: RRR, no murmur,no extra heart sounds, BLE edema 2+ GI: abdomen soft, normal BS, no masses, no tenderness, no hepatomegaly, no splenomegaly EXTREMITIES: able to move all 4 extremities PSYCHIATRIC: the patient is alert & oriented to person, affect & behavior  appropriate  LABS/RADIOLOGY: 12/01/13  sodium 141 potassium 4.4 glucose 119 BUN 29 creatinine 0.7 calcium 9.5  11/23/13  sodium 141 potassium 4.2 glucose 144 BUN 28 creatinine 0.8 calcium 9.3 11/03/13  sodium 141 potassium 4.4 glucose 102 BUN 30 creatinine 1.0 calcium 9.5 10/30/13  WBC 7.7 hemoglobin 11.2 hematocrit 37.1 10/22/13  sodium 138 potassium 4.2 glucose 78 BUN 26 creatinine 0.8 calcium 8.6 10/13/13 sodium 137 potassium 4.6 glucose 89 BUN 25 creatinine 0.8 calcium was 9.1  vitamin B12 505.0  folate 1244 10/11/13 WBC 7.0 hemoglobin 10.0 hematocrit 32.8 MCV 107.2 Labs reviewed: Basic Metabolic Panel:  Recent Labs  10/02/13 0419 10/03/13 0527 10/04/13 0441  NA 133* 136* 136*  K 3.9 4.5 5.0  CL 95* 101 99  CO2 27 26 28   GLUCOSE 187* 122* 101*  BUN 36* 38* 41*  CREATININE 0.95 0.98 0.88  CALCIUM 8.6 8.7 9.1   Liver Function Tests:  Recent Labs  05/26/13 0708 08/19/13 1125  AST 27 28  ALT 13 17  ALKPHOS 144* 128*  BILITOT 0.5 0.5  PROT 6.5 6.7  ALBUMIN 2.5* 3.0*    Recent Labs  05/26/13 0708  LIPASE 24   CBC:  Recent Labs  05/26/13 0708  10/01/13 1211 10/02/13 0419 10/03/13 0527 10/04/13 0441  WBC 7.3  < > 21.2* 16.1* 11.6* 11.0*  NEUTROABS 4.4  --  18.5*  --   --   --   HGB 12.0  < > 11.0* 9.8* 9.4* 9.9*  HCT 36.6  < > 32.4* 29.0* 28.0* 30.4*  MCV 99.2  < > 99.1 99.3 99.6 101.7*  PLT 245  < > 115* 105* 107* 125*  < > =  values in this interval not displayed.   Cardiac Enzymes:  Recent Labs  10/01/13 1708 10/01/13 2154 10/02/13 0419  TROPONINI <0.30 <0.30 <0.30    EXAM: CHEST  2 VIEW   COMPARISON:  05/27/2013   FINDINGS: Enlargement of cardiac silhouette.   Tortuous aorta.   Mediastinal contours and pulmonary vascularity normal.   Rotation to the RIGHT.   Elevation of LEFT diaphragm unchanged.   LEFT basilar atelectasis and small LEFT pleural effusion, chronic.   No definite acute infiltrate or pneumothorax.   Bones  demineralized with BILATERAL glenohumeral degenerative changes, prior vertebroplasty of a lower thoracic vertebra, and additional thoracic compression fractures.   IMPRESSION: Enlargement of cardiac silhouette.   Chronic elevation of LEFT diaphragm with LEFT basilar atelectasis and small pleural effusion. EXAM: RIGHT KNEE - COMPLETE 4+ VIEW   COMPARISON:  None   FINDINGS: Severe osseous demineralization.   Hip joint imaged separately, excluded.   Large knee joint effusion.   Medial compartment joint space narrowing RIGHT knee with chondrocalcinosis question CPPD.   Minimally displaced fracture at medial tibial plateau likely extending into tibial spine region.   Additional chronic calcification medial to the medial tibial plateau likely from remote MCL injury.   No additional fracture dislocation.   IMPRESSION: Marked osseous demineralization.   Minimally displaced fracture at the medial tibial plateau likely extending into the tibial spine region, associated with large RIGHT knee joint effusion. EXAM: RIGHT HIP - COMPLETE 2+ VIEW   COMPARISON:  Right hip and AP pelvis x-rays 11/12/2004. CT abdomen and pelvis 08/31/2013.   FINDINGS: Right total hip arthroplasty with anatomic alignment. No radiographic evidence of prosthetic loosening. Severe osseous demineralization, markedly progressive since the prior examination. Age indeterminate fracture involving the right superior pubic ramus, new since the CT 1 month ago.   Included AP pelvis shows a prior left total hip arthroplasty with anatomic alignment. No fractures elsewhere involving the bony pelvis. Degenerative changes involving the visualized lower lumbar spine. Calcification in the right upper pelvis, shown on prior CT to represent a calcified epiploic appendage. Oral contrast material within sigmoid colon diverticula related to the prior CT.   IMPRESSION: 1. Age indeterminate fracture of the right  superior pubic ramus, new since the CT 1 month ago. Please correlate with point tenderness. 2. No evidence of acute fracture elsewhere. 3. Severe osseous demineralization. 4. Prior bilateral total hip arthroplasties with anatomic alignment.    ASSESSMENT/PLAN:  Right medial tibial tibial fracture - continue rehabilitation Pubic ramus fracture - continue rehabilitation CAD - stable; continue ASA GERD - continue Prilosec Anemia - stable; continue Decubivite Pressure ulcer stage IV - left buttock ulcer with dressing; continue calcium alginate Hypothyroidism - continue Synthroid Severe protein-calorie malnutrition - continue supplementation Constipation - continue Miralax and Colace Anxiety - stable; continue Klonopin Insomnia - continue Melatonin Edema, BLE - continue Lasix    CPT CODE: 63785  Monina Vargas - NP [place] [phone]

## 2013-12-28 ENCOUNTER — Non-Acute Institutional Stay (SKILLED_NURSING_FACILITY): Payer: Medicare Other | Admitting: Adult Health

## 2013-12-28 ENCOUNTER — Encounter: Payer: Self-pay | Admitting: Adult Health

## 2013-12-28 DIAGNOSIS — S32591S Other specified fracture of right pubis, sequela: Secondary | ICD-10-CM

## 2013-12-28 DIAGNOSIS — K5900 Constipation, unspecified: Secondary | ICD-10-CM

## 2013-12-28 DIAGNOSIS — E039 Hypothyroidism, unspecified: Secondary | ICD-10-CM

## 2013-12-28 DIAGNOSIS — L8994 Pressure ulcer of unspecified site, stage 4: Secondary | ICD-10-CM

## 2013-12-28 DIAGNOSIS — S32501S Unspecified fracture of right pubis, sequela: Secondary | ICD-10-CM

## 2013-12-28 DIAGNOSIS — R600 Localized edema: Secondary | ICD-10-CM

## 2013-12-28 DIAGNOSIS — K219 Gastro-esophageal reflux disease without esophagitis: Secondary | ICD-10-CM

## 2013-12-28 DIAGNOSIS — F419 Anxiety disorder, unspecified: Secondary | ICD-10-CM

## 2013-12-28 DIAGNOSIS — D649 Anemia, unspecified: Secondary | ICD-10-CM

## 2013-12-28 DIAGNOSIS — S82191S Other fracture of upper end of right tibia, sequela: Secondary | ICD-10-CM

## 2013-12-28 DIAGNOSIS — S82131S Displaced fracture of medial condyle of right tibia, sequela: Secondary | ICD-10-CM

## 2013-12-28 NOTE — Progress Notes (Signed)
Patient ID: [name], female   DOB: Dec 15, 1915, 80 y.o.   MRN: [id number]             PROGRESS NOTE  DATE:      12/28/13  FACILITY: Nursing Home Location: [facility]  LEVEL OF CARE: SNF (31)  Discharge Notes  HISTORY OF PRESENT ILLNESS:  This is a 80 year old female who is for discharge to ALF with Home health PT, OT and Nursing with wound care protocol. DME: alternating pressure pumping pad mattress, semi-electric hospital bed. She has been admitted to [facility] on 10/04/13 from [facility]. She fell @ home sustaining a right medial tibial plateau fracture. Ortho recommended non-operative management. Patient was admitted to this facility for short-term rehabilitation after the patient's recent hospitalization.  Patient has completed SNF rehabilitation and therapy has cleared the patient for discharge.  REASSESSMENT OF ONGOING PROBLEM(S):  HYPOTHYROIDISM: The hypothyroidism remains stable. No complications noted from the medications presently being used.  The patient denies fatigue or constipation.  7/15 TSH 2.090  CAD: The angina has been stable. The patient denies dyspnea on exertion, orthopnea, pedal edema, palpitations and paroxysmal nocturnal dyspnea. No complications noted from the medication presently being used.  ANXIETY: The anxiety remains stable. Patient denies ongoing anxiety or irritability. No complications reported from the medications currently being used.  PAST MEDICAL HISTORY : Reviewed.  No changes/see problem list  CURRENT MEDICATIONS: Reviewed per MAR/see medication list  REVIEW OF SYSTEMS:  GENERAL: no change in appetite, no fatigue, no weight changes, no fever, chills or weakness RESPIRATORY: no cough, SOB, DOE, wheezing, hemoptysis CARDIAC: no chest pain, or palpitations, +edema GI: no abdominal pain, diarrhea, heart burn, nausea or vomiting, +constipation  PHYSICAL EXAMINATION  GENERAL: no acute distress, normal body  habitus EYES:  Lids open and close normally. No blepharitis, entropion or ectropion. NECK: supple, trachea midline, no neck masses, no thyroid tenderness, no thyromegaly RESPIRATORY: breathing is even & unlabored, BS CTAB CARDIAC: RRR, no murmur,no extra heart sounds, BLE edema 1+ GI: abdomen soft, normal BS, no masses, no tenderness, no hepatomegaly, no splenomegaly EXTREMITIES: able to move all 4 extremities; able to walk with walker PSYCHIATRIC: the patient is alert & oriented to person, affect & behavior appropriate  LABS/RADIOLOGY: 12/01/13  sodium 141 potassium 4.4 glucose 119 BUN 29 creatinine 0.7 calcium 9.5  11/23/13  sodium 141 potassium 4.2 glucose 144 BUN 28 creatinine 0.8 calcium 9.3 11/03/13  sodium 141 potassium 4.4 glucose 102 BUN 30 creatinine 1.0 calcium 9.5 10/30/13  WBC 7.7 hemoglobin 11.2 hematocrit 37.1 10/22/13  sodium 138 potassium 4.2 glucose 78 BUN 26 creatinine 0.8 calcium 8.6 10/13/13 sodium 137 potassium 4.6 glucose 89 BUN 25 creatinine 0.8 calcium was 9.1  vitamin B12 505.0  folate 1244 10/11/13 WBC 7.0 hemoglobin 10.0 hematocrit 32.8 MCV 107.2 Labs reviewed: Basic Metabolic Panel:  Recent Labs  10/02/13 0419 10/03/13 0527 10/04/13 0441  NA 133* 136* 136*  K 3.9 4.5 5.0  CL 95* 101 99  CO2 27 26 28   GLUCOSE 187* 122* 101*  BUN 36* 38* 41*  CREATININE 0.95 0.98 0.88  CALCIUM 8.6 8.7 9.1   Liver Function Tests:  Recent Labs  05/26/13 0708 08/19/13 1125  AST 27 28  ALT 13 17  ALKPHOS 144* 128*  BILITOT 0.5 0.5  PROT 6.5 6.7  ALBUMIN 2.5* 3.0*    Recent Labs  05/26/13 0708  LIPASE 24   CBC:  Recent Labs  05/26/13 0708  10/01/13 1211  10/02/13 0419 10/03/13 0527 10/04/13 0441  WBC 7.3  < > 21.2* 16.1* 11.6* 11.0*  NEUTROABS 4.4  --  18.5*  --   --   --   HGB 12.0  < > 11.0* 9.8* 9.4* 9.9*  HCT 36.6  < > 32.4* 29.0* 28.0* 30.4*  MCV 99.2  < > 99.1 99.3 99.6 101.7*  PLT 245  < > 115* 105* 107* 125*  < > = values in this interval not  displayed.   Cardiac Enzymes:  Recent Labs  10/01/13 1708 10/01/13 2154 10/02/13 0419  TROPONINI <0.30 <0.30 <0.30    EXAM: CHEST  2 VIEW   COMPARISON:  05/27/2013   FINDINGS: Enlargement of cardiac silhouette.   Tortuous aorta.   Mediastinal contours and pulmonary vascularity normal.   Rotation to the RIGHT.   Elevation of LEFT diaphragm unchanged.   LEFT basilar atelectasis and small LEFT pleural effusion, chronic.   No definite acute infiltrate or pneumothorax.   Bones demineralized with BILATERAL glenohumeral degenerative changes, prior vertebroplasty of a lower thoracic vertebra, and additional thoracic compression fractures.   IMPRESSION: Enlargement of cardiac silhouette.   Chronic elevation of LEFT diaphragm with LEFT basilar atelectasis and small pleural effusion. EXAM: RIGHT KNEE - COMPLETE 4+ VIEW   COMPARISON:  None   FINDINGS: Severe osseous demineralization.   Hip joint imaged separately, excluded.   Large knee joint effusion.   Medial compartment joint space narrowing RIGHT knee with chondrocalcinosis question CPPD.   Minimally displaced fracture at medial tibial plateau likely extending into tibial spine region.   Additional chronic calcification medial to the medial tibial plateau likely from remote MCL injury.   No additional fracture dislocation.   IMPRESSION: Marked osseous demineralization.   Minimally displaced fracture at the medial tibial plateau likely extending into the tibial spine region, associated with large RIGHT knee joint effusion. EXAM: RIGHT HIP - COMPLETE 2+ VIEW   COMPARISON:  Right hip and AP pelvis x-rays 11/12/2004. CT abdomen and pelvis 08/31/2013.   FINDINGS: Right total hip arthroplasty with anatomic alignment. No radiographic evidence of prosthetic loosening. Severe osseous demineralization, markedly progressive since the prior examination. Age indeterminate fracture involving the right  superior pubic ramus, new since the CT 1 month ago.   Included AP pelvis shows a prior left total hip arthroplasty with anatomic alignment. No fractures elsewhere involving the bony pelvis. Degenerative changes involving the visualized lower lumbar spine. Calcification in the right upper pelvis, shown on prior CT to represent a calcified epiploic appendage. Oral contrast material within sigmoid colon diverticula related to the prior CT.   IMPRESSION: 1. Age indeterminate fracture of the right superior pubic ramus, new since the CT 1 month ago. Please correlate with point tenderness. 2. No evidence of acute fracture elsewhere. 3. Severe osseous demineralization. 4. Prior bilateral total hip arthroplasties with anatomic alignment.    ASSESSMENT/PLAN:  Right medial tibial tibial fracture - for home health PT, OT and nursing Pubic ramus fracture - for home health PT, OT and nursing CAD - stable; continue ASA GERD - continue Prilosec Anemia - stable; continue Decubivite Pressure ulcer stage IV - left buttock ulcer with dressing; continue calcium alginate Hypothyroidism - continue Synthroid Constipation - continue Miralax and Colace Anxiety - stable; continue Klonopin Insomnia - continue Melatonin Edema, BLE - continue Lasix    I have filled out patient's discharge paperwork and written prescriptions.  Patient will receive home health PT, OT and Nursing.   DME provided:  alternating pressure pumping pad  mattress, semi-electric hospital bed  Total discharge time: Greater than 30 minutes  Discharge time involved coordination of the discharge process with social worker, nursing staff and therapy department. Medical justification for home health services/DME verified.   CPT CODE: 38184   Seth Bake - NP [facility] [phone]

## 2014-04-23 ENCOUNTER — Encounter (HOSPITAL_COMMUNITY): Payer: Self-pay | Admitting: Emergency Medicine

## 2014-04-23 ENCOUNTER — Emergency Department (HOSPITAL_COMMUNITY): Payer: Medicare Other

## 2014-04-23 ENCOUNTER — Emergency Department (HOSPITAL_COMMUNITY)
Admission: EM | Admit: 2014-04-23 | Discharge: 2014-04-23 | Disposition: A | Discharge status: Home / Self Care | Payer: Medicare Other | Attending: Emergency Medicine | Admitting: Emergency Medicine

## 2014-04-23 DIAGNOSIS — D689 Coagulation defect, unspecified: Secondary | ICD-10-CM | POA: Insufficient documentation

## 2014-04-23 DIAGNOSIS — Z880 Allergy status to penicillin: Secondary | ICD-10-CM | POA: Diagnosis not present

## 2014-04-23 DIAGNOSIS — Z79899 Other long term (current) drug therapy: Secondary | ICD-10-CM | POA: Diagnosis not present

## 2014-04-23 DIAGNOSIS — K219 Gastro-esophageal reflux disease without esophagitis: Secondary | ICD-10-CM | POA: Insufficient documentation

## 2014-04-23 DIAGNOSIS — R05 Cough: Secondary | ICD-10-CM | POA: Insufficient documentation

## 2014-04-23 DIAGNOSIS — Z792 Long term (current) use of antibiotics: Secondary | ICD-10-CM | POA: Diagnosis not present

## 2014-04-23 DIAGNOSIS — I2510 Atherosclerotic heart disease of native coronary artery without angina pectoris: Secondary | ICD-10-CM | POA: Diagnosis not present

## 2014-04-23 DIAGNOSIS — R059 Cough, unspecified: Secondary | ICD-10-CM

## 2014-04-23 DIAGNOSIS — M1990 Unspecified osteoarthritis, unspecified site: Secondary | ICD-10-CM | POA: Insufficient documentation

## 2014-04-23 DIAGNOSIS — Z7982 Long term (current) use of aspirin: Secondary | ICD-10-CM | POA: Diagnosis not present

## 2014-04-23 DIAGNOSIS — N390 Urinary tract infection, site not specified: Secondary | ICD-10-CM | POA: Diagnosis not present

## 2014-04-23 DIAGNOSIS — Y9389 Activity, other specified: Secondary | ICD-10-CM | POA: Diagnosis not present

## 2014-04-23 DIAGNOSIS — X58XXXA Exposure to other specified factors, initial encounter: Secondary | ICD-10-CM | POA: Diagnosis not present

## 2014-04-23 DIAGNOSIS — Y998 Other external cause status: Secondary | ICD-10-CM | POA: Insufficient documentation

## 2014-04-23 DIAGNOSIS — Y9289 Other specified places as the place of occurrence of the external cause: Secondary | ICD-10-CM | POA: Insufficient documentation

## 2014-04-23 DIAGNOSIS — S81819A Laceration without foreign body, unspecified lower leg, initial encounter: Secondary | ICD-10-CM | POA: Insufficient documentation

## 2014-04-23 DIAGNOSIS — S81811A Laceration without foreign body, right lower leg, initial encounter: Secondary | ICD-10-CM

## 2014-04-23 HISTORY — DX: Asymptomatic varicose veins of unspecified lower extremity: I83.90

## 2014-04-23 HISTORY — DX: Left bundle-branch block, unspecified: I44.7

## 2014-04-23 HISTORY — DX: Anemia, unspecified: D64.9

## 2014-04-23 LAB — CBC WITH DIFFERENTIAL/PLATELET
BASOS PCT: 0 % (ref 0–1)
Basophils Absolute: 0 10*3/uL (ref 0.0–0.1)
EOS PCT: 5 % (ref 0–5)
Eosinophils Absolute: 0.3 10*3/uL (ref 0.0–0.7)
HEMATOCRIT: 36.4 % (ref 36.0–46.0)
HEMOGLOBIN: 11.9 g/dL — AB (ref 12.0–15.0)
LYMPHS PCT: 31 % (ref 12–46)
Lymphs Abs: 1.9 10*3/uL (ref 0.7–4.0)
MCH: 32.2 pg (ref 26.0–34.0)
MCHC: 32.7 g/dL (ref 30.0–36.0)
MCV: 98.6 fL (ref 78.0–100.0)
MONOS PCT: 10 % (ref 3–12)
Monocytes Absolute: 0.6 10*3/uL (ref 0.1–1.0)
NEUTROS PCT: 54 % (ref 43–77)
Neutro Abs: 3.4 10*3/uL (ref 1.7–7.7)
PLATELETS: 153 10*3/uL (ref 150–400)
RBC: 3.69 MIL/uL — ABNORMAL LOW (ref 3.87–5.11)
RDW: 16.3 % — ABNORMAL HIGH (ref 11.5–15.5)
WBC: 6.1 10*3/uL (ref 4.0–10.5)

## 2014-04-23 LAB — URINALYSIS, ROUTINE W REFLEX MICROSCOPIC
Bilirubin Urine: NEGATIVE
Glucose, UA: NEGATIVE mg/dL
HGB URINE DIPSTICK: NEGATIVE
KETONES UR: NEGATIVE mg/dL
Nitrite: POSITIVE — AB
Protein, ur: NEGATIVE mg/dL
SPECIFIC GRAVITY, URINE: 1.02 (ref 1.005–1.030)
Urobilinogen, UA: 0.2 mg/dL (ref 0.0–1.0)
pH: 5 (ref 5.0–8.0)

## 2014-04-23 LAB — I-STAT CHEM 8, ED
BUN: 39 mg/dL — ABNORMAL HIGH (ref 6–23)
CALCIUM ION: 1.24 mmol/L (ref 1.13–1.30)
Chloride: 95 mmol/L — ABNORMAL LOW (ref 96–112)
Creatinine, Ser: 1 mg/dL (ref 0.50–1.10)
Glucose, Bld: 98 mg/dL (ref 70–99)
HCT: 40 % (ref 36.0–46.0)
Hemoglobin: 13.6 g/dL (ref 12.0–15.0)
Potassium: 4.5 mmol/L (ref 3.5–5.1)
Sodium: 143 mmol/L (ref 135–145)
TCO2: 29 mmol/L (ref 0–100)

## 2014-04-23 LAB — BASIC METABOLIC PANEL
Anion gap: 4 — ABNORMAL LOW (ref 5–15)
BUN: 36 mg/dL — ABNORMAL HIGH (ref 6–23)
CALCIUM: 9.2 mg/dL (ref 8.4–10.5)
CHLORIDE: 106 mmol/L (ref 96–112)
CO2: 33 mmol/L — ABNORMAL HIGH (ref 19–32)
Creatinine, Ser: 0.88 mg/dL (ref 0.50–1.10)
GFR, EST AFRICAN AMERICAN: 62 mL/min — AB (ref >90)
GFR, EST NON AFRICAN AMERICAN: 54 mL/min — AB (ref >90)
Glucose, Bld: 96 mg/dL (ref 70–99)
Potassium: 4.5 mmol/L (ref 3.5–5.1)
Sodium: 143 mmol/L (ref 135–145)

## 2014-04-23 LAB — URINE MICROSCOPIC-ADD ON

## 2014-04-23 LAB — PROTIME-INR
INR: 1.06 (ref 0.00–1.49)
Prothrombin Time: 14 seconds (ref 11.6–15.2)

## 2014-04-23 MED ORDER — CIPROFLOXACIN HCL 500 MG PO TABS: 500.0000 mg | ORAL_TABLET | Freq: Two times a day (BID) | ORAL | Status: AC

## 2014-04-23 MED ORDER — SODIUM CHLORIDE 0.9 % IV BOLUS (SEPSIS)
500.0000 mL | Freq: Once | INTRAVENOUS | Status: AC
  Administered 2014-04-23: 500 mL via INTRAVENOUS

## 2014-04-23 NOTE — Discharge Instructions (Signed)
Skin Tear Care A skin tear is a wound in which the top layer of skin has peeled off. This is a common problem with aging because the skin becomes thinner and more fragile as a person gets older. In addition, some medicines, such as oral corticosteroids, can lead to skin thinning if taken for long periods of time.  A skin tear is often repaired with tape or skin adhesive strips. This keeps the skin that has been peeled off in contact with the healthier skin beneath. Depending on the location of the wound, a bandage (dressing) may be applied over the tape or skin adhesive strips. Sometimes, during the healing process, the skin turns black and dies. Even when this happens, the torn skin acts as a good dressing until the skin underneath gets healthier and repairs itself. HOME CARE INSTRUCTIONS   Change dressings once per day or as directed by your caregiver.  Gently clean the skin tear and the area around the tear using saline solution or mild soap and water.  Do not rub the injured skin dry. Let the area air dry.  Apply petroleum jelly or an antibiotic cream or ointment to keep the tear moist. This will help the wound heal. Do not allow a scab to form.  If the dressing sticks before the next dressing change, moisten it with warm soapy water and gently remove it.  Protect the injured skin until it has healed.  Only take over-the-counter or prescription medicines as directed by your caregiver.  Take showers or baths using warm soapy water. Apply a new dressing after the shower or bath.  Keep all follow-up appointments as directed by your caregiver.  SEEK IMMEDIATE MEDICAL CARE IF:   You have redness, swelling, or increasing pain in the skin tear.  You havepus coming from the skin tear.  You have chills.  You have a red streak that goes away from the skin tear.  You have a bad smell coming from the tear or dressing.  You have a fever or persistent symptoms for more than 2-3 days.  You  have a fever and your symptoms suddenly get worse. MAKE SURE YOU:  Understand these instructions.  Will watch this condition.  Will get help right away if your child is not doing well or gets worse. Document Released: 11/27/2000 Document Revised: 11/27/2011 Document Reviewed: 09/16/2011 [facility] Patient Information 2015 [place], [place]. This information is not intended to replace advice given to you by your health care provider. Make sure you discuss any questions you have with your health care provider.   Urinary Tract Infection Urinary tract infections (UTIs) can develop anywhere along your urinary tract. Your urinary tract is your body's drainage system for removing wastes and extra water. Your urinary tract includes two kidneys, two ureters, a bladder, and a urethra. Your kidneys are a pair of bean-shaped organs. Each kidney is about the size of your fist. They are located below your ribs, one on each side of your spine. CAUSES Infections are caused by microbes, which are microscopic organisms, including fungi, viruses, and bacteria. These organisms are so small that they can only be seen through a microscope. Bacteria are the microbes that most commonly cause UTIs. SYMPTOMS  Symptoms of UTIs may vary by age and gender of the patient and by the location of the infection. Symptoms in young women typically include a frequent and intense urge to urinate and a painful, burning feeling in the bladder or urethra during urination. Older women and men  are more likely to be tired, shaky, and weak and have muscle aches and abdominal pain. A fever may mean the infection is in your kidneys. Other symptoms of a kidney infection include pain in your back or sides below the ribs, nausea, and vomiting. DIAGNOSIS To diagnose a UTI, your caregiver will ask you about your symptoms. Your caregiver also will ask to provide a urine sample. The urine sample will be tested for bacteria and white blood cells. White  blood cells are made by your body to help fight infection. TREATMENT  Typically, UTIs can be treated with medication. Because most UTIs are caused by a bacterial infection, they usually can be treated with the use of antibiotics. The choice of antibiotic and length of treatment depend on your symptoms and the type of bacteria causing your infection. HOME CARE INSTRUCTIONS  If you were prescribed antibiotics, take them exactly as your caregiver instructs you. Finish the medication even if you feel better after you have only taken some of the medication.  Drink enough water and fluids to keep your urine clear or pale yellow.  Avoid caffeine, tea, and carbonated beverages. They tend to irritate your bladder.  Empty your bladder often. Avoid holding urine for long periods of time.  Empty your bladder before and after sexual intercourse.  After a bowel movement, women should cleanse from front to back. Use each tissue only once. SEEK MEDICAL CARE IF:   You have back pain.  You develop a fever.  Your symptoms do not begin to resolve within 3 days. SEEK IMMEDIATE MEDICAL CARE IF:   You have severe back pain or lower abdominal pain.  You develop chills.  You have nausea or vomiting.  You have continued burning or discomfort with urination. MAKE SURE YOU:   Understand these instructions.  Will watch your condition.  Will get help right away if you are not doing well or get worse. Document Released: 12/12/2004 Document Revised: 09/03/2011 Document Reviewed: 04/12/2011 [facility] Patient Information 2015 [place], [place]. This information is not intended to replace advice given to you by your health care provider. Make sure you discuss any questions you have with your health care provider.

## 2014-04-23 NOTE — ED Notes (Signed)
Patient transported to X-ray 

## 2014-04-23 NOTE — ED Notes (Addendum)
Received pt from [facility] with c/o bleeding from varicose vein intermittently while ambulating x 2 days. Area wrapped with gauze dressing upon arrival to ED. Pt denies pain. Dressing removed, skin tear noted to right lower leg, bleeding controlled. Pt reports occurred yesterday after shower and when support hose was being applied.

## 2014-04-26 LAB — URINE CULTURE

## 2014-04-27 ENCOUNTER — Telehealth (HOSPITAL_BASED_OUTPATIENT_CLINIC_OR_DEPARTMENT_OTHER): Payer: Self-pay | Admitting: Emergency Medicine

## 2014-04-27 ENCOUNTER — Telehealth (HOSPITAL_COMMUNITY): Payer: Self-pay

## 2014-04-27 NOTE — Telephone Encounter (Signed)
Post ED Visit - Positive Culture Follow-up  Culture report reviewed by antimicrobial stewardship pharmacist: []  Wes [place], Pharm.D., BCPS [x]  Heide Guile, Pharm.D., BCPS []  Alycia Rossetti, Pharm.D., BCPS []  Grant, Pharm.D., BCPS, AAHIVP []  Legrand Como, Pharm.D., BCPS, AAHIVP []  Isac Sarna, Pharm.D., BCPS  Positive urine culture E. Coli Treated with ciprofloxacin, organism sensitive to the same and no further patient follow-up is required at this time.  Hazle Nordmann 04/27/2014, 5:02 PM

## 2014-04-28 NOTE — ED Provider Notes (Signed)
CSN: [id number]     Arrival date & time 04/23/14  1001 History   First MD Initiated Contact with Patient 04/23/14 1007     Chief Complaint  Patient presents with  . Coagulation Disorder     (Consider location/radiation/quality/duration/timing/severity/associated sxs/prior Treatment) HPI Comments: Pt has secondary complaint of cough and 1 day of dysuria.   Patient is a 81 y.o. female presenting with skin laceration. The history is provided by the patient. No language interpreter was used.  Laceration Location:  Leg Leg laceration location:  L lower leg Length (cm):  2 Depth:  Cutaneous Bleeding: controlled   Time since incident:  2 days Laceration mechanism:  Unable to specify Pain details:    Quality:  Unable to specify   Severity:  Unable to specify   Timing:  Unable to specify Foreign body present:  Unable to specify   Past Medical History  Diagnosis Date  . Arthritis   . Coronary artery disease   . GERD (gastroesophageal reflux disease)   . Anemia   . LBBB (left bundle branch block)   . Varicose vein of leg    Past Surgical History  Procedure Laterality Date  . Hip fracture surgery      x 3  . Orif femur fracture Left 12/13/2012    Procedure: OPEN REDUCTION INTERNAL FIXATION (ORIF) DISTAL FEMUR FRACTURE;  Surgeon: Mauri Pole, MD;  Location: [facility];  Service: Orthopedics;  Laterality: Left;  . Appendectomy    . Cholecystectomy    . Kyphoplasty     Family History  Problem Relation Age of Onset  . Diabetes Maternal Grandmother   . Diabetes Paternal Grandmother    History  Substance Use Topics  . Smoking status: Never Smoker   . Smokeless tobacco: Never Used  . Alcohol Use: No   OB History    No data available     Review of Systems  Constitutional: Negative for fever, chills, diaphoresis, activity change, appetite change and fatigue.  HENT: Negative for congestion, facial swelling, rhinorrhea and sore throat.   Eyes: Negative for photophobia and  discharge.  Respiratory: Negative for cough, chest tightness and shortness of breath.   Cardiovascular: Negative for chest pain, palpitations and leg swelling.  Gastrointestinal: Negative for nausea, vomiting, abdominal pain and diarrhea.  Endocrine: Negative for polydipsia and polyuria.  Genitourinary: Negative for dysuria, frequency, difficulty urinating and pelvic pain.  Musculoskeletal: Negative for back pain, arthralgias, neck pain and neck stiffness.  Skin: Negative for color change and wound.  Allergic/Immunologic: Negative for immunocompromised state.  Neurological: Negative for facial asymmetry, weakness, numbness and headaches.  Hematological: Does not bruise/bleed easily.  Psychiatric/Behavioral: Negative for confusion and agitation.      Allergies  Penicillins  Home Medications   Prior to Admission medications   Medication Sig Start Date End Date Taking? Authorizing Provider  acetaminophen (TYLENOL) 325 MG tablet Take 2 tablets (650 mg total) by mouth every 6 (six) hours as needed for mild pain (or Fever >/= 101). 10/04/13  Yes Janece Canterbury, MD  aspirin EC 81 MG EC tablet Take 1 tablet (81 mg total) by mouth daily. 10/04/13  Yes Janece Canterbury, MD  cholestyramine Lucrezia Starch) 4 G packet Take 4 g by mouth daily as needed (for loose stools).   Yes Historical Provider, MD  clonazePAM (KLONOPIN) 0.5 MG tablet Take 1 tablet (0.5 mg total) by mouth at bedtime. Patient taking differently: Take 0.25 mg by mouth at bedtime.  10/04/13  Yes [place], DO  docusate sodium 100 MG CAPS Take 100 mg by mouth 2 (two) times daily. 10/04/13  Yes Janece Canterbury, MD  Eyelid Cleansers (OCUSOFT LID SCRUB) PADS Place 1 each into both eyes 2 (two) times daily.   Yes Historical Provider, MD  furosemide (LASIX) 40 MG tablet Take 40 mg by mouth daily.   Yes Historical Provider, MD  hydroxypropyl methylcellulose (ISOPTO TEARS) 2.5 % ophthalmic solution Place 1 drop into both eyes 2 (two) times  daily.   Yes Historical Provider, MD  levothyroxine (SYNTHROID, LEVOTHROID) 25 MCG tablet Take 25 mcg by mouth daily before breakfast.   Yes Historical Provider, MD  lisinopril (PRINIVIL,ZESTRIL) 2.5 MG tablet Take 2.5 mg by mouth daily.   Yes Historical Provider, MD  magnesium oxide (MAG-OX) 400 MG tablet Take 400 mg by mouth daily.   Yes Historical Provider, MD  Melatonin 3 MG TABS Take 3 mg by mouth at bedtime.   Yes Historical Provider, MD  Menthol, Topical Analgesic, 4 % GEL Apply topically 2 (two) times daily.   Yes Historical Provider, MD  Multiple Vitamins-Minerals (DECUBI-VITE) CAPS Take by mouth daily.   Yes Historical Provider, MD  Multiple Vitamins-Minerals (ICAPS) TABS Take 1 tablet by mouth 2 (two) times daily.   Yes Historical Provider, MD  omeprazole (PRILOSEC) 40 MG capsule Take 1 capsule (40 mg total) by mouth every morning. 05/29/13  Yes Delfina Redwood, MD  ondansetron (ZOFRAN) 4 MG tablet Take 4 mg by mouth 2 (two) times daily as needed for nausea or vomiting. 10/01/13  Yes Historical Provider, MD  oxyCODONE-acetaminophen (PERCOCET/ROXICET) 5-325 MG per tablet Take one to two tablets by mouth every 4 hours as needed for moderate to severe pain. Administer prior before painful procedures 10/04/13  Yes Tiffany L Reed, DO  oxyCODONE-acetaminophen (PERCOCET/ROXICET) 5-325 MG per tablet Take 1 tablet by mouth every 4 (four) hours as needed for moderate pain or severe pain.   Yes Historical Provider, MD  potassium chloride SA (K-DUR,KLOR-CON) 20 MEQ tablet Take 20 mEq by mouth daily.   Yes Historical Provider, MD  Probiotic Product (PROBIOTIC DAILY) CAPS Take by mouth daily.   Yes Historical Provider, MD  protein supplement (RESOURCE BENEPROTEIN) POWD Take 1 scoop by mouth 2 (two) times daily.   Yes Historical Provider, MD  ciprofloxacin (CIPRO) 500 MG tablet Take 1 tablet (500 mg total) by mouth 2 (two) times daily. One po bid x 7 days 04/23/14   Ernestina Patches, MD  clindamycin  (CLEOCIN) 300 MG capsule Take 600 mg by mouth See admin instructions. Takes 600mg  as needed 1-hour prior to any scheduled dental appointment    Historical Provider, MD  enoxaparin (LOVENOX) 30 MG/0.3ML injection Inject 0.3 mLs (30 mg total) into the skin daily. Lovenox injection daily for one more week and then discontinue. Patient not taking: Reported on 04/23/2014 10/04/13   Arlee Muslim, PA-C  feeding supplement, ENSURE COMPLETE, (ENSURE COMPLETE) LIQD Take 237 mLs by mouth 3 (three) times daily between meals. Patient not taking: Reported on 04/23/2014 05/29/13   Delfina Redwood, MD  hydrocortisone cream 0.5 % Apply 1 application topically as needed for itching. Apply to forehead    Historical Provider, MD  levofloxacin (LEVAQUIN) 250 MG tablet Take 250 mg by mouth daily. Takes for 2 weeks 10/01/13   Historical Provider, MD  Menthol, Topical Analgesic, (ICY HOT PAIN RELIEVING) 2.5 % GEL Apply 1 application topically as needed (for lower back pain).    Historical Provider, MD  Multiple Vitamins-Minerals (THEREMS M PO) Take  1 tablet by mouth daily with breakfast.    Historical Provider, MD  polyethylene glycol (MIRALAX / GLYCOLAX) packet Take 17 g by mouth daily as needed for mild constipation.    Historical Provider, MD   BP 108/48 mmHg  Pulse 63  Temp(Src) 97.3 F (36.3 C) (Oral)  Ht 4\' 8"  (1.422 m)  Wt 141 lb (63.957 kg)  BMI 31.63 kg/m2  SpO2 90% Physical Exam  Constitutional: She is oriented to person, place, and time. She appears well-developed and well-nourished. No distress.  HENT:  Head: Normocephalic and atraumatic.  Mouth/Throat: No oropharyngeal exudate.  Eyes: Pupils are equal, round, and reactive to light.  Neck: Normal range of motion. Neck supple.  Cardiovascular: Normal rate, regular rhythm and normal heart sounds.  Exam reveals no gallop and no friction rub.   No murmur heard. Pulmonary/Chest: Effort normal and breath sounds normal. No respiratory distress. She has no  wheezes. She has no rales.  Abdominal: Soft. Bowel sounds are normal. She exhibits no distension and no mass. There is no tenderness. There is no rebound and no guarding.  Musculoskeletal: Normal range of motion. She exhibits no edema or tenderness.       Legs: Neurological: She is alert and oriented to person, place, and time.  Skin: Skin is warm and dry.  Psychiatric: She has a normal mood and affect.    ED Course  Procedures (including critical care time) Labs Review Labs Reviewed  CBC WITH DIFFERENTIAL/PLATELET - Abnormal; Notable for the following:    RBC 3.69 (*)    Hemoglobin 11.9 (*)    RDW 16.3 (*)    All other components within normal limits  BASIC METABOLIC PANEL - Abnormal; Notable for the following:    CO2 33 (*)    BUN 36 (*)    GFR calc non Af Amer 54 (*)    GFR calc Af Amer 62 (*)    Anion gap 4 (*)    All other components within normal limits  URINALYSIS, ROUTINE W REFLEX MICROSCOPIC - Abnormal; Notable for the following:    APPearance CLOUDY (*)    Nitrite POSITIVE (*)    Leukocytes, UA MODERATE (*)    All other components within normal limits  URINE MICROSCOPIC-ADD ON - Abnormal; Notable for the following:    Bacteria, UA FEW (*)    All other components within normal limits  I-STAT CHEM 8, ED - Abnormal; Notable for the following:    Chloride 95 (*)    BUN 39 (*)    All other components within normal limits  URINE CULTURE  PROTIME-INR    Imaging Review No results found.   EKG Interpretation None      MDM   Final diagnoses:  Cough  UTI (lower urinary tract infection)  Skin tear of right lower leg without complication, initial encounter    Pt is a 81 y.o. female with Pmhx as above who presents with report of bleeding wound from skin tear for 2 days. Area not bleeding on PE,combat gauze and dressing placed.  However, has secondary complaint of cough and dysuria. CXR with no acute changes. Urine appears infected. Will d/c home w/  cipro.     Benjamine Mola Galvan evaluation in the Emergency Department is complete. It has been determined that no acute conditions requiring further emergency intervention are present at this time. The patient/guardian have been advised of the diagnosis and plan. We have discussed signs and symptoms that warrant return to the ED, such  as changes or worsening in symptoms, uncontrolled bleeding with direct pressure, fever, abdominal pain.       Ernestina Patches, MD 04/28/14 2115

## 2014-05-20 ENCOUNTER — Encounter (HOSPITAL_COMMUNITY): Payer: Self-pay

## 2014-05-20 ENCOUNTER — Emergency Department (HOSPITAL_COMMUNITY): Payer: Medicare Other

## 2014-05-20 ENCOUNTER — Emergency Department (HOSPITAL_COMMUNITY)
Admission: EM | Admit: 2014-05-20 | Discharge: 2014-05-21 | Disposition: A | Discharge status: Home / Self Care | Payer: Medicare Other | Attending: Emergency Medicine | Admitting: Emergency Medicine

## 2014-05-20 DIAGNOSIS — Z7952 Long term (current) use of systemic steroids: Secondary | ICD-10-CM | POA: Insufficient documentation

## 2014-05-20 DIAGNOSIS — Z7982 Long term (current) use of aspirin: Secondary | ICD-10-CM | POA: Insufficient documentation

## 2014-05-20 DIAGNOSIS — M79671 Pain in right foot: Secondary | ICD-10-CM | POA: Insufficient documentation

## 2014-05-20 DIAGNOSIS — K219 Gastro-esophageal reflux disease without esophagitis: Secondary | ICD-10-CM | POA: Insufficient documentation

## 2014-05-20 DIAGNOSIS — Z792 Long term (current) use of antibiotics: Secondary | ICD-10-CM | POA: Insufficient documentation

## 2014-05-20 DIAGNOSIS — Z862 Personal history of diseases of the blood and blood-forming organs and certain disorders involving the immune mechanism: Secondary | ICD-10-CM | POA: Insufficient documentation

## 2014-05-20 DIAGNOSIS — I2510 Atherosclerotic heart disease of native coronary artery without angina pectoris: Secondary | ICD-10-CM | POA: Insufficient documentation

## 2014-05-20 DIAGNOSIS — Z880 Allergy status to penicillin: Secondary | ICD-10-CM | POA: Insufficient documentation

## 2014-05-20 MED ORDER — OXYCODONE-ACETAMINOPHEN 5-325 MG PO TABS
1.0000 | ORAL_TABLET | Freq: Once | ORAL | Status: AC
  Administered 2014-05-21: 1 via ORAL
  Filled 2014-05-20: qty 1

## 2014-05-20 NOTE — ED Notes (Signed)
Pt presents with c/o right foot pain for approx one month. Pt reports no injury to that foot but says that the whole foot is hurting. Pt reports she has been able to ambulate with a walker but it has been painful to walk on that foot.

## 2014-05-21 DIAGNOSIS — M79671 Pain in right foot: Secondary | ICD-10-CM | POA: Diagnosis not present

## 2014-05-21 NOTE — ED Provider Notes (Signed)
CSN: [id number]     Arrival date & time 05/20/14  2147 History   First MD Initiated Contact with Patient 05/20/14 2317     Chief Complaint  Patient presents with  . Foot Pain     The history is provided by the patient.   patient reports pain in her right foot for several months.  She states this is normally relieved with oxycodone.  She tends to have more pain in the morning time.  She mentioned to her aid tonight that she was having pain in her right foot and are a became concerned and thus she is in the emergency department.  The patient states that that wasn't for her age she would've not been concerned about this pain and would not presented to the ER tonight.  She spoke with her doctor about her right foot pain.  No true etiology has been found at this point.  She states her right foot pain is intermittent.  She states overall today at his been more uncomfortable.  She denies weakness of her right foot.  She denies pain with range of motion of her right ankle.  She denies trauma.  Pain is mild in severity.  She has not had her evening Percocet which she states normally helps with her pain.  Past Medical History  Diagnosis Date  . Arthritis   . Coronary artery disease   . GERD (gastroesophageal reflux disease)   . Anemia   . LBBB (left bundle branch block)   . Varicose vein of leg    Past Surgical History  Procedure Laterality Date  . Hip fracture surgery      x 3  . Orif femur fracture Left 12/13/2012    Procedure: OPEN REDUCTION INTERNAL FIXATION (ORIF) DISTAL FEMUR FRACTURE;  Surgeon: Mauri Pole, MD;  Location: [facility];  Service: Orthopedics;  Laterality: Left;  . Appendectomy    . Cholecystectomy    . Kyphoplasty     Family History  Problem Relation Age of Onset  . Diabetes Maternal Grandmother   . Diabetes Paternal Grandmother    History  Substance Use Topics  . Smoking status: Never Smoker   . Smokeless tobacco: Never Used  . Alcohol Use: No   OB History    No  data available     Review of Systems  All other systems reviewed and are negative.     Allergies  Penicillins  Home Medications   Prior to Admission medications   Medication Sig Start Date End Date Taking? Authorizing Provider  aspirin EC 81 MG EC tablet Take 1 tablet (81 mg total) by mouth daily. 10/04/13  Yes Janece Canterbury, MD  clonazePAM (KLONOPIN) 0.5 MG tablet Take 1 tablet (0.5 mg total) by mouth at bedtime. Patient taking differently: Take 0.25 mg by mouth at bedtime.  10/04/13  Yes Tiffany L Reed, DO  Eyelid Cleansers (OCUSOFT LID SCRUB) PADS Place 1 each into both eyes 2 (two) times daily.   Yes Historical Provider, MD  furosemide (LASIX) 40 MG tablet Take 40 mg by mouth daily.   Yes Historical Provider, MD  Hypromellose 0.5 % SOLN Place 1 drop into both eyes 2 (two) times daily.   Yes Historical Provider, MD  levothyroxine (SYNTHROID, LEVOTHROID) 25 MCG tablet Take 25 mcg by mouth daily before breakfast.   Yes Historical Provider, MD  lisinopril (PRINIVIL,ZESTRIL) 2.5 MG tablet Take 2.5 mg by mouth daily.   Yes Historical Provider, MD  Melatonin 3 MG TABS Take 3  mg by mouth at bedtime.   Yes Historical Provider, MD  Menthol, Topical Analgesic, 4 % GEL Apply topically 2 (two) times daily.   Yes Historical Provider, MD  Multiple Vitamins-Minerals (ICAPS) TABS Take 1 tablet by mouth 2 (two) times daily.   Yes Historical Provider, MD  Multiple Vitamins-Minerals (THEREMS M PO) Take 1 tablet by mouth daily with breakfast.   Yes Historical Provider, MD  omeprazole (PRILOSEC) 40 MG capsule Take 1 capsule (40 mg total) by mouth every morning. 05/29/13  Yes Delfina Redwood, MD  oxyCODONE-acetaminophen (PERCOCET/ROXICET) 5-325 MG per tablet Take 1 tablet by mouth at bedtime.    Yes Historical Provider, MD  potassium chloride SA (K-DUR,KLOR-CON) 20 MEQ tablet Take 20 mEq by mouth daily.   Yes Historical Provider, MD  Probiotic Product (PROBIOTIC DAILY) CAPS Take by mouth daily.   Yes  Historical Provider, MD  protein supplement (RESOURCE BENEPROTEIN) POWD Take 1 scoop by mouth 2 (two) times daily.   Yes Historical Provider, MD  acetaminophen (TYLENOL) 325 MG tablet Take 2 tablets (650 mg total) by mouth every 6 (six) hours as needed for mild pain (or Fever >/= 101). 10/04/13   Janece Canterbury, MD  cholestyramine Lucrezia Starch) 4 G packet Take 4 g by mouth daily as needed (for loose stools).    Historical Provider, MD  ciprofloxacin (CIPRO) 500 MG tablet Take 1 tablet (500 mg total) by mouth 2 (two) times daily. One po bid x 7 days Patient not taking: Reported on 05/20/2014 04/23/14   Ernestina Patches, MD  clindamycin (CLEOCIN) 300 MG capsule Take 600 mg by mouth See admin instructions. Takes 600mg  as needed 1-hour prior to any scheduled dental appointment    Historical Provider, MD  docusate sodium 100 MG CAPS Take 100 mg by mouth 2 (two) times daily. Patient taking differently: Take 100 mg by mouth daily as needed. For stool softner 10/04/13   Janece Canterbury, MD  enoxaparin (LOVENOX) 30 MG/0.3ML injection Inject 0.3 mLs (30 mg total) into the skin daily. Lovenox injection daily for one more week and then discontinue. Patient not taking: Reported on 04/23/2014 10/04/13   Arlee Muslim, PA-C  feeding supplement, ENSURE COMPLETE, (ENSURE COMPLETE) LIQD Take 237 mLs by mouth 3 (three) times daily between meals. Patient not taking: Reported on 04/23/2014 05/29/13   Delfina Redwood, MD  hydrocortisone cream 0.5 % Apply 1 application topically as needed for itching. Apply to forehead    Historical Provider, MD  hydroxypropyl methylcellulose (ISOPTO TEARS) 2.5 % ophthalmic solution Place 1 drop into both eyes 2 (two) times daily.    Historical Provider, MD  levofloxacin (LEVAQUIN) 250 MG tablet Take 250 mg by mouth daily. Takes for 2 weeks 10/01/13   Historical Provider, MD  magnesium oxide (MAG-OX) 400 MG tablet Take 400 mg by mouth daily.    Historical Provider, MD  Menthol, Topical Analgesic, (ICY  HOT PAIN RELIEVING) 2.5 % GEL Apply 1 application topically as needed (for lower back pain).    Historical Provider, MD  Multiple Vitamins-Minerals (DECUBI-VITE) CAPS Take by mouth daily.    Historical Provider, MD  ondansetron (ZOFRAN) 4 MG tablet Take 4 mg by mouth 2 (two) times daily as needed for nausea or vomiting. 10/01/13   Historical Provider, MD  oxyCODONE-acetaminophen (PERCOCET/ROXICET) 5-325 MG per tablet Take one to two tablets by mouth every 4 hours as needed for moderate to severe pain. Administer prior before painful procedures 10/04/13   Tiffany L Reed, DO  polyethylene glycol (MIRALAX / Floria Raveling)  packet Take 17 g by mouth daily as needed for mild constipation.    Historical Provider, MD   BP 134/66 mmHg  Pulse 70  Resp 20  SpO2 96% Physical Exam  Constitutional: She is oriented to person, place, and time. She appears well-developed and well-nourished. No distress.  HENT:  Head: Normocephalic and atraumatic.  Eyes: EOM are normal.  Neck: Normal range of motion.  Cardiovascular: Normal rate, regular rhythm and normal heart sounds.   Pulmonary/Chest: Effort normal and breath sounds normal.  Abdominal: Soft. She exhibits no distension.  Musculoskeletal:  Normal PT arteries bilaterally.  Absent DP artery and the right foot.  Right foot is perfused.  Right foot is warm.  Full range of motion of right ankle.  Tenderness of the plantar surface near the medial midfoot on the right foot.  No erythema or skin changes.  No ulcerations.  No rash.  Full range of motion of right knee.  No swelling of her right lower extremity as compared to her last.  Neurological: She is alert and oriented to person, place, and time.  Skin: Skin is warm and dry.  Psychiatric: She has a normal mood and affect. Judgment normal.  Nursing note and vitals reviewed.   ED Course  Procedures (including critical care time) Labs Review Labs Reviewed - No data to display  Imaging Review Dg Foot Complete  Right  05/20/2014   CLINICAL DATA:  Right foot pain and numbness along the plantar surface for several days. No indication of trauma.  EXAM: RIGHT FOOT COMPLETE - 3+ VIEW  COMPARISON:  None.  FINDINGS: There is no evidence of fracture or dislocation. There is no focal bone lesion. No notable arthritis; no significant plantar spurring. Probable hammertoe deformities of the second through fourth digits. Prominent osteopenia.  IMPRESSION: No acute osseous findings.   Electronically Signed   By: Monte Fantasia M.D.   On: 05/20/2014 23:02  I personally reviewed the imaging tests through PACS system I reviewed available ER/hospitalization records through the EMR    EKG Interpretation None      MDM   Final diagnoses:  Right foot pain   Intermittent and chronic right foot pain.  Worsening tonight.  She does have a strong right PT pulse but absent in her dorsalis pedis artery.  She has a strong popliteal artery.  Her right foot is perfused.  It is warm.  I don't think this is related to her dorsalis pedis artery, as I suspect this is more of a chronic finding.  She will follow-up with vascular surgery however the family states even if there was an issue that they would be extremely hesitant for any surgical procedure given her advanced age.     Hoy Morn, MD 05/21/14 [phone]

## 2014-10-29 ENCOUNTER — Inpatient Hospital Stay (HOSPITAL_COMMUNITY)
Admission: EM | Admit: 2014-10-29 | Discharge: 2014-11-17 | DRG: 871 | Disposition: E | Payer: Medicare Other | Attending: Internal Medicine | Admitting: Internal Medicine

## 2014-10-29 ENCOUNTER — Emergency Department (HOSPITAL_COMMUNITY): Payer: Medicare Other

## 2014-10-29 ENCOUNTER — Encounter (HOSPITAL_COMMUNITY): Payer: Self-pay

## 2014-10-29 DIAGNOSIS — I5031 Acute diastolic (congestive) heart failure: Secondary | ICD-10-CM | POA: Diagnosis present

## 2014-10-29 DIAGNOSIS — I272 Other secondary pulmonary hypertension: Secondary | ICD-10-CM | POA: Diagnosis present

## 2014-10-29 DIAGNOSIS — J690 Pneumonitis due to inhalation of food and vomit: Secondary | ICD-10-CM | POA: Diagnosis present

## 2014-10-29 DIAGNOSIS — M546 Pain in thoracic spine: Secondary | ICD-10-CM | POA: Insufficient documentation

## 2014-10-29 DIAGNOSIS — IMO0001 Reserved for inherently not codable concepts without codable children: Secondary | ICD-10-CM | POA: Insufficient documentation

## 2014-10-29 DIAGNOSIS — Z515 Encounter for palliative care: Secondary | ICD-10-CM

## 2014-10-29 DIAGNOSIS — E871 Hypo-osmolality and hyponatremia: Secondary | ICD-10-CM | POA: Diagnosis present

## 2014-10-29 DIAGNOSIS — Z9049 Acquired absence of other specified parts of digestive tract: Secondary | ICD-10-CM | POA: Diagnosis present

## 2014-10-29 DIAGNOSIS — I9589 Other hypotension: Secondary | ICD-10-CM | POA: Insufficient documentation

## 2014-10-29 DIAGNOSIS — Z7982 Long term (current) use of aspirin: Secondary | ICD-10-CM

## 2014-10-29 DIAGNOSIS — R339 Retention of urine, unspecified: Secondary | ICD-10-CM | POA: Diagnosis present

## 2014-10-29 DIAGNOSIS — Z6835 Body mass index (BMI) 35.0-35.9, adult: Secondary | ICD-10-CM

## 2014-10-29 DIAGNOSIS — W07XXXA Fall from chair, initial encounter: Secondary | ICD-10-CM | POA: Diagnosis present

## 2014-10-29 DIAGNOSIS — R0902 Hypoxemia: Secondary | ICD-10-CM

## 2014-10-29 DIAGNOSIS — A419 Sepsis, unspecified organism: Secondary | ICD-10-CM | POA: Insufficient documentation

## 2014-10-29 DIAGNOSIS — R404 Transient alteration of awareness: Secondary | ICD-10-CM | POA: Insufficient documentation

## 2014-10-29 DIAGNOSIS — R4189 Other symptoms and signs involving cognitive functions and awareness: Secondary | ICD-10-CM | POA: Insufficient documentation

## 2014-10-29 DIAGNOSIS — G9340 Encephalopathy, unspecified: Secondary | ICD-10-CM | POA: Diagnosis not present

## 2014-10-29 DIAGNOSIS — J189 Pneumonia, unspecified organism: Secondary | ICD-10-CM | POA: Insufficient documentation

## 2014-10-29 DIAGNOSIS — M4854XA Collapsed vertebra, not elsewhere classified, thoracic region, initial encounter for fracture: Secondary | ICD-10-CM | POA: Diagnosis present

## 2014-10-29 DIAGNOSIS — I2510 Atherosclerotic heart disease of native coronary artery without angina pectoris: Secondary | ICD-10-CM | POA: Diagnosis present

## 2014-10-29 DIAGNOSIS — Z79899 Other long term (current) drug therapy: Secondary | ICD-10-CM

## 2014-10-29 DIAGNOSIS — N179 Acute kidney failure, unspecified: Secondary | ICD-10-CM | POA: Diagnosis present

## 2014-10-29 DIAGNOSIS — R0789 Other chest pain: Secondary | ICD-10-CM | POA: Diagnosis not present

## 2014-10-29 DIAGNOSIS — R079 Chest pain, unspecified: Secondary | ICD-10-CM | POA: Diagnosis present

## 2014-10-29 DIAGNOSIS — J181 Lobar pneumonia, unspecified organism: Secondary | ICD-10-CM | POA: Diagnosis present

## 2014-10-29 DIAGNOSIS — M549 Dorsalgia, unspecified: Secondary | ICD-10-CM | POA: Diagnosis present

## 2014-10-29 DIAGNOSIS — R0600 Dyspnea, unspecified: Secondary | ICD-10-CM

## 2014-10-29 DIAGNOSIS — E039 Hypothyroidism, unspecified: Secondary | ICD-10-CM | POA: Diagnosis present

## 2014-10-29 DIAGNOSIS — E43 Unspecified severe protein-calorie malnutrition: Secondary | ICD-10-CM | POA: Diagnosis present

## 2014-10-29 DIAGNOSIS — Z66 Do not resuscitate: Secondary | ICD-10-CM | POA: Diagnosis present

## 2014-10-29 DIAGNOSIS — Z833 Family history of diabetes mellitus: Secondary | ICD-10-CM

## 2014-10-29 DIAGNOSIS — I509 Heart failure, unspecified: Secondary | ICD-10-CM

## 2014-10-29 DIAGNOSIS — K219 Gastro-esophageal reflux disease without esophagitis: Secondary | ICD-10-CM | POA: Diagnosis present

## 2014-10-29 DIAGNOSIS — G8929 Other chronic pain: Secondary | ICD-10-CM | POA: Diagnosis present

## 2014-10-29 DIAGNOSIS — R6521 Severe sepsis with septic shock: Secondary | ICD-10-CM | POA: Diagnosis present

## 2014-10-29 DIAGNOSIS — R32 Unspecified urinary incontinence: Secondary | ICD-10-CM | POA: Diagnosis present

## 2014-10-29 DIAGNOSIS — J9601 Acute respiratory failure with hypoxia: Secondary | ICD-10-CM | POA: Diagnosis present

## 2014-10-29 DIAGNOSIS — I959 Hypotension, unspecified: Secondary | ICD-10-CM | POA: Diagnosis present

## 2014-10-29 LAB — CBC WITH DIFFERENTIAL/PLATELET
BASOS ABS: 0 10*3/uL (ref 0.0–0.1)
BASOS PCT: 0 % (ref 0–1)
Eosinophils Absolute: 0.2 10*3/uL (ref 0.0–0.7)
Eosinophils Relative: 2 % (ref 0–5)
HCT: 41 % (ref 36.0–46.0)
HEMOGLOBIN: 13.1 g/dL (ref 12.0–15.0)
LYMPHS ABS: 2.5 10*3/uL (ref 0.7–4.0)
LYMPHS PCT: 28 % (ref 12–46)
MCH: 33.2 pg (ref 26.0–34.0)
MCHC: 32 g/dL (ref 30.0–36.0)
MCV: 104.1 fL — ABNORMAL HIGH (ref 78.0–100.0)
MONO ABS: 0.7 10*3/uL (ref 0.1–1.0)
Monocytes Relative: 7 % (ref 3–12)
NEUTROS ABS: 5.4 10*3/uL (ref 1.7–7.7)
Neutrophils Relative %: 63 % (ref 43–77)
PLATELETS: 184 10*3/uL (ref 150–400)
RBC: 3.94 MIL/uL (ref 3.87–5.11)
RDW: 15.6 % — ABNORMAL HIGH (ref 11.5–15.5)
WBC: 8.8 10*3/uL (ref 4.0–10.5)

## 2014-10-29 MED ORDER — MORPHINE SULFATE 2 MG/ML IJ SOLN
2.0000 mg | Freq: Once | INTRAMUSCULAR | Status: AC
  Administered 2014-10-29: 2 mg via INTRAVENOUS
  Filled 2014-10-29: qty 1

## 2014-10-29 MED ORDER — ONDANSETRON HCL 4 MG/2ML IJ SOLN
4.0000 mg | Freq: Once | INTRAMUSCULAR | Status: AC
  Administered 2014-10-29: 4 mg via INTRAVENOUS
  Filled 2014-10-29: qty 2

## 2014-10-29 MED ORDER — NALOXONE HCL 0.4 MG/ML IJ SOLN
0.4000 mg | Freq: Once | INTRAMUSCULAR | Status: DC
  Filled 2014-10-29: qty 1

## 2014-10-29 MED ORDER — FENTANYL CITRATE (PF) 100 MCG/2ML IJ SOLN
25.0000 ug | Freq: Once | INTRAMUSCULAR | Status: AC
  Administered 2014-10-29: 25 ug via INTRAVENOUS

## 2014-10-29 MED ORDER — FENTANYL CITRATE (PF) 100 MCG/2ML IJ SOLN
50.0000 ug | Freq: Once | INTRAMUSCULAR | Status: DC
  Filled 2014-10-29: qty 2

## 2014-10-29 MED ORDER — SODIUM CHLORIDE 0.9 % IV BOLUS (SEPSIS)
250.0000 mL | Freq: Once | INTRAVENOUS | Status: AC
  Administered 2014-10-29: 250 mL via INTRAVENOUS

## 2014-10-29 NOTE — ED Notes (Signed)
Pt was given 2mg  morphine. Oxygen sats dropped into the 50's, I put the patient on non-rebreather and her sats came back up. Notified EDP, see orders.

## 2014-10-29 NOTE — ED Notes (Signed)
Pt arrived VIA [facility] from [facility] at [facility]. C/O pain 8/10 in her right shoulder secondary to a fall. Pt reports she fell from a standing position onto a wheelchair. Pt, EMS and MAR indicate pt is not on anticoagulants. Of note, Pt has a new, dressed bruising on left elbow.

## 2014-10-29 NOTE — ED Notes (Signed)
Bed: QS12 Expected date:  Expected time:  Means of arrival:  Comments: EMS 81 yo female from [facility]-fell back/struck right shoulder blade/declined transport at that time/now very painful and wants transport

## 2014-10-29 NOTE — ED Notes (Signed)
Patient transported to X-ray 

## 2014-10-29 NOTE — ED Provider Notes (Addendum)
CSN: [id number]     Arrival date & time 11/16/2014  2017 History   First MD Initiated Contact with Patient 10/24/2014 2040     Chief Complaint  Patient presents with  . Shoulder Pain    Right     (Consider location/radiation/quality/duration/timing/severity/associated sxs/prior Treatment) HPI..... Level V caveat for urgent need for intervention. Patient complains of both right upper back and right chest pain for the past several hours. She apparently accidentally fell today. No dyspnea, diaphoresis, nausea. She has known coronary artery disease and severe osteoarthritis in her spine  Past Medical History  Diagnosis Date  . Arthritis   . Coronary artery disease   . GERD (gastroesophageal reflux disease)   . Anemia   . LBBB (left bundle branch block)   . Varicose vein of leg    Past Surgical History  Procedure Laterality Date  . Hip fracture surgery      x 3  . Orif femur fracture Left 12/13/2012    Procedure: OPEN REDUCTION INTERNAL FIXATION (ORIF) DISTAL FEMUR FRACTURE;  Surgeon: Mauri Pole, MD;  Location: [facility];  Service: Orthopedics;  Laterality: Left;  . Appendectomy    . Cholecystectomy    . Kyphoplasty     Family History  Problem Relation Age of Onset  . Diabetes Maternal Grandmother   . Diabetes Paternal Grandmother    Social History  Substance Use Topics  . Smoking status: Never Smoker   . Smokeless tobacco: Never Used  . Alcohol Use: No   OB History    No data available     Review of Systems  Unable to perform ROS: Acuity of condition      Allergies  Penicillins  Home Medications   Prior to Admission medications   Medication Sig Start Date End Date Taking? Authorizing Provider  aspirin EC 81 MG EC tablet Take 1 tablet (81 mg total) by mouth daily. 10/04/13  Yes Janece Canterbury, MD  cholestyramine Lucrezia Starch) 4 G packet Take 4 g by mouth daily as needed (for loose stools).   Yes Historical Provider, MD  clonazePAM (KLONOPIN) 0.5 MG tablet Take 1  tablet (0.5 mg total) by mouth at bedtime. Patient taking differently: Take 0.25 mg by mouth at bedtime.  10/04/13  Yes Tiffany L Reed, DO  Eyelid Cleansers (OCUSOFT LID SCRUB) PADS Place 1 each into both eyes 2 (two) times daily.   Yes Historical Provider, MD  furosemide (LASIX) 40 MG tablet Take 40 mg by mouth daily.   Yes Historical Provider, MD  levothyroxine (SYNTHROID, LEVOTHROID) 25 MCG tablet Take 25 mcg by mouth daily before breakfast.   Yes Historical Provider, MD  lisinopril (PRINIVIL,ZESTRIL) 2.5 MG tablet Take 2.5 mg by mouth daily.   Yes Historical Provider, MD  Multiple Vitamins-Minerals (THEREMS M PO) Take 1 tablet by mouth daily with breakfast.   Yes Historical Provider, MD  oxyCODONE-acetaminophen (PERCOCET/ROXICET) 5-325 MG per tablet Take one to two tablets by mouth every 4 hours as needed for moderate to severe pain. Administer prior before painful procedures 10/04/13  Yes Tiffany L Reed, DO  polyvinyl alcohol (LIQUIFILM TEARS) 1.4 % ophthalmic solution Place 1 drop into both eyes 2 (two) times daily.   Yes Historical Provider, MD  potassium chloride SA (K-DUR,KLOR-CON) 20 MEQ tablet Take 20 mEq by mouth daily.   Yes Historical Provider, MD  Probiotic Product (PROBIOTIC DAILY) CAPS Take by mouth daily.   Yes Historical Provider, MD  acetaminophen (TYLENOL) 325 MG tablet Take 2 tablets (650 mg total)  by mouth every 6 (six) hours as needed for mild pain (or Fever >/= 101). Patient not taking: Reported on 10/26/2014 10/04/13   Janece Canterbury, MD  ciprofloxacin (CIPRO) 500 MG tablet Take 1 tablet (500 mg total) by mouth 2 (two) times daily. One po bid x 7 days Patient not taking: Reported on 05/20/2014 04/23/14   Ernestina Patches, MD  clindamycin (CLEOCIN) 300 MG capsule Take 600 mg by mouth See admin instructions. Takes 600mg  as needed 1-hour prior to any scheduled dental appointment    Historical Provider, MD  docusate sodium 100 MG CAPS Take 100 mg by mouth 2 (two) times daily. Patient  taking differently: Take 100 mg by mouth daily as needed. For stool softner 10/04/13   Janece Canterbury, MD  enoxaparin (LOVENOX) 30 MG/0.3ML injection Inject 0.3 mLs (30 mg total) into the skin daily. Lovenox injection daily for one more week and then discontinue. Patient not taking: Reported on 04/23/2014 10/04/13   Arlee Muslim, PA-C  feeding supplement, ENSURE COMPLETE, (ENSURE COMPLETE) LIQD Take 237 mLs by mouth 3 (three) times daily between meals. Patient not taking: Reported on 04/23/2014 05/29/13   Delfina Redwood, MD  Menthol, Topical Analgesic, 4 % GEL Apply topically 2 (two) times daily.    Historical Provider, MD  omeprazole (PRILOSEC) 40 MG capsule Take 1 capsule (40 mg total) by mouth every morning. Patient not taking: Reported on 10/31/2014 05/29/13   Delfina Redwood, MD  ondansetron [facility]) 4 MG tablet Take 4 mg by mouth 2 (two) times daily as needed for nausea or vomiting. 10/01/13   Historical Provider, MD  polyethylene glycol (MIRALAX / GLYCOLAX) packet Take 17 g by mouth daily as needed for mild constipation.    Historical Provider, MD   BP 145/58 mmHg  Pulse 72  Temp(Src) 98.5 F (36.9 C) (Oral)  Resp 17  SpO2 92% Physical Exam  Constitutional: She is oriented to person, place, and time. She appears well-developed and well-nourished.  HENT:  Head: Normocephalic and atraumatic.  Eyes: Conjunctivae and EOM are normal. Pupils are equal, round, and reactive to light.  Neck: Normal range of motion. Neck supple.  Cardiovascular: Normal rate and regular rhythm.   Pulmonary/Chest: Effort normal and breath sounds normal.  Abdominal: Soft. Bowel sounds are normal.  Musculoskeletal:  Tender right upper back  Neurological: She is alert and oriented to person, place, and time.  Skin: Skin is warm and dry.  Psychiatric: She has a normal mood and affect. Her behavior is normal.  Nursing note and vitals reviewed.   ED Course  Procedures (including critical care time) Labs  Review Labs Reviewed  BASIC METABOLIC PANEL  CBC WITH DIFFERENTIAL/PLATELET  TROPONIN I    Imaging Review Dg Thoracic Spine 2 View  10/17/2014   CLINICAL DATA:  Status post fall, with upper back pain. Initial encounter.  EXAM: THORACIC SPINE 2 VIEWS  COMPARISON:  Chest radiograph performed 04/23/2014  FINDINGS: There is no evidence of acute fracture or subluxation. Chronic compression deformities are noted at vertebral bodies T6, T9, T10 and T11, with changes of vertebroplasty at T10. Degenerative change is noted along the upper lumbar spine.  The visualized portions of both lungs are grossly clear. The mediastinum is unremarkable in appearance.  IMPRESSION: 1. No evidence of acute fracture or subluxation along the thoracic spine. 2. Chronic compression deformities at T6, T9, T10 and T11, with changes of vertebroplasty at T10.   Electronically Signed   By: Garald Balding M.D.   On: 10/22/2014  22:29   I, Natalie Mceuen, personally reviewed and evaluated these images and lab results as part of my medical decision-making.   EKG Interpretation None      MDM   Final diagnoses:  Right-sided thoracic back pain  Chest pain, unspecified chest pain type    Patient complains of right upper back and right anterior chest pain.  Thoracic films show chronic compression deformities of T6, 9, 10, 11.  Labs pending. Admit to general medicine.  Disc c Dr Nance Pear, MD 11/01/2014 5102  Nat Christen, MD 10/28/2014 [phone]

## 2014-10-30 ENCOUNTER — Observation Stay (HOSPITAL_COMMUNITY): Payer: Medicare Other

## 2014-10-30 DIAGNOSIS — M546 Pain in thoracic spine: Secondary | ICD-10-CM | POA: Insufficient documentation

## 2014-10-30 DIAGNOSIS — R6521 Severe sepsis with septic shock: Secondary | ICD-10-CM | POA: Diagnosis present

## 2014-10-30 DIAGNOSIS — R4189 Other symptoms and signs involving cognitive functions and awareness: Secondary | ICD-10-CM | POA: Insufficient documentation

## 2014-10-30 DIAGNOSIS — R339 Retention of urine, unspecified: Secondary | ICD-10-CM | POA: Diagnosis present

## 2014-10-30 DIAGNOSIS — I5031 Acute diastolic (congestive) heart failure: Secondary | ICD-10-CM | POA: Diagnosis not present

## 2014-10-30 DIAGNOSIS — Z79899 Other long term (current) drug therapy: Secondary | ICD-10-CM | POA: Diagnosis not present

## 2014-10-30 DIAGNOSIS — J181 Lobar pneumonia, unspecified organism: Secondary | ICD-10-CM | POA: Diagnosis present

## 2014-10-30 DIAGNOSIS — Z9049 Acquired absence of other specified parts of digestive tract: Secondary | ICD-10-CM | POA: Diagnosis present

## 2014-10-30 DIAGNOSIS — A419 Sepsis, unspecified organism: Principal | ICD-10-CM

## 2014-10-30 DIAGNOSIS — G8929 Other chronic pain: Secondary | ICD-10-CM | POA: Diagnosis present

## 2014-10-30 DIAGNOSIS — Z6835 Body mass index (BMI) 35.0-35.9, adult: Secondary | ICD-10-CM | POA: Diagnosis not present

## 2014-10-30 DIAGNOSIS — J9601 Acute respiratory failure with hypoxia: Secondary | ICD-10-CM | POA: Diagnosis present

## 2014-10-30 DIAGNOSIS — Z66 Do not resuscitate: Secondary | ICD-10-CM | POA: Diagnosis present

## 2014-10-30 DIAGNOSIS — R0789 Other chest pain: Secondary | ICD-10-CM | POA: Diagnosis present

## 2014-10-30 DIAGNOSIS — E039 Hypothyroidism, unspecified: Secondary | ICD-10-CM | POA: Diagnosis present

## 2014-10-30 DIAGNOSIS — I272 Other secondary pulmonary hypertension: Secondary | ICD-10-CM | POA: Diagnosis present

## 2014-10-30 DIAGNOSIS — E871 Hypo-osmolality and hyponatremia: Secondary | ICD-10-CM | POA: Diagnosis present

## 2014-10-30 DIAGNOSIS — M549 Dorsalgia, unspecified: Secondary | ICD-10-CM | POA: Diagnosis not present

## 2014-10-30 DIAGNOSIS — I509 Heart failure, unspecified: Secondary | ICD-10-CM | POA: Diagnosis not present

## 2014-10-30 DIAGNOSIS — Z833 Family history of diabetes mellitus: Secondary | ICD-10-CM | POA: Diagnosis not present

## 2014-10-30 DIAGNOSIS — I959 Hypotension, unspecified: Secondary | ICD-10-CM | POA: Diagnosis present

## 2014-10-30 DIAGNOSIS — R079 Chest pain, unspecified: Secondary | ICD-10-CM | POA: Diagnosis present

## 2014-10-30 DIAGNOSIS — J690 Pneumonitis due to inhalation of food and vomit: Secondary | ICD-10-CM | POA: Diagnosis present

## 2014-10-30 DIAGNOSIS — G9340 Encephalopathy, unspecified: Secondary | ICD-10-CM | POA: Diagnosis not present

## 2014-10-30 DIAGNOSIS — R32 Unspecified urinary incontinence: Secondary | ICD-10-CM | POA: Diagnosis present

## 2014-10-30 DIAGNOSIS — J189 Pneumonia, unspecified organism: Secondary | ICD-10-CM | POA: Diagnosis not present

## 2014-10-30 DIAGNOSIS — W07XXXA Fall from chair, initial encounter: Secondary | ICD-10-CM | POA: Diagnosis present

## 2014-10-30 DIAGNOSIS — N179 Acute kidney failure, unspecified: Secondary | ICD-10-CM | POA: Diagnosis present

## 2014-10-30 DIAGNOSIS — K219 Gastro-esophageal reflux disease without esophagitis: Secondary | ICD-10-CM | POA: Diagnosis present

## 2014-10-30 DIAGNOSIS — E43 Unspecified severe protein-calorie malnutrition: Secondary | ICD-10-CM | POA: Diagnosis present

## 2014-10-30 DIAGNOSIS — Z7982 Long term (current) use of aspirin: Secondary | ICD-10-CM | POA: Diagnosis not present

## 2014-10-30 DIAGNOSIS — I2510 Atherosclerotic heart disease of native coronary artery without angina pectoris: Secondary | ICD-10-CM | POA: Diagnosis present

## 2014-10-30 DIAGNOSIS — IMO0001 Reserved for inherently not codable concepts without codable children: Secondary | ICD-10-CM | POA: Insufficient documentation

## 2014-10-30 DIAGNOSIS — I9589 Other hypotension: Secondary | ICD-10-CM

## 2014-10-30 DIAGNOSIS — R404 Transient alteration of awareness: Secondary | ICD-10-CM

## 2014-10-30 DIAGNOSIS — Z515 Encounter for palliative care: Secondary | ICD-10-CM | POA: Diagnosis not present

## 2014-10-30 DIAGNOSIS — M4854XA Collapsed vertebra, not elsewhere classified, thoracic region, initial encounter for fracture: Secondary | ICD-10-CM | POA: Diagnosis present

## 2014-10-30 LAB — BASIC METABOLIC PANEL
ANION GAP: 7 (ref 5–15)
Anion gap: 9 (ref 5–15)
BUN: 38 mg/dL — ABNORMAL HIGH (ref 6–20)
BUN: 43 mg/dL — AB (ref 6–20)
CO2: 28 mmol/L (ref 22–32)
CO2: 31 mmol/L (ref 22–32)
Calcium: 8.4 mg/dL — ABNORMAL LOW (ref 8.9–10.3)
Calcium: 9.5 mg/dL (ref 8.9–10.3)
Chloride: 102 mmol/L (ref 101–111)
Chloride: 106 mmol/L (ref 101–111)
Creatinine, Ser: 1.11 mg/dL — ABNORMAL HIGH (ref 0.44–1.00)
Creatinine, Ser: 1.42 mg/dL — ABNORMAL HIGH (ref 0.44–1.00)
GFR calc Af Amer: 35 mL/min — ABNORMAL LOW (ref >60)
GFR calc Af Amer: 47 mL/min — ABNORMAL LOW (ref >60)
GFR, EST NON AFRICAN AMERICAN: 30 mL/min — AB (ref >60)
GFR, EST NON AFRICAN AMERICAN: 41 mL/min — AB (ref >60)
GLUCOSE: 139 mg/dL — AB (ref 65–99)
Glucose, Bld: 138 mg/dL — ABNORMAL HIGH (ref 65–99)
POTASSIUM: 5.5 mmol/L — AB (ref 3.5–5.1)
POTASSIUM: 4.9 mmol/L (ref 3.5–5.1)
Sodium: 141 mmol/L (ref 135–145)
Sodium: 142 mmol/L (ref 135–145)

## 2014-10-30 LAB — MRSA PCR SCREENING
MRSA BY PCR: POSITIVE — AB
MRSA by PCR: POSITIVE — AB

## 2014-10-30 LAB — COMPREHENSIVE METABOLIC PANEL
ALT: 31 U/L (ref 14–54)
ANION GAP: 6 (ref 5–15)
AST: 37 U/L (ref 15–41)
Albumin: 3 g/dL — ABNORMAL LOW (ref 3.5–5.0)
Alkaline Phosphatase: 113 U/L (ref 38–126)
BUN: 37 mg/dL — AB (ref 6–20)
CALCIUM: 8.6 mg/dL — AB (ref 8.9–10.3)
CO2: 27 mmol/L (ref 22–32)
Chloride: 107 mmol/L (ref 101–111)
Creatinine, Ser: 1.13 mg/dL — ABNORMAL HIGH (ref 0.44–1.00)
GFR calc Af Amer: 46 mL/min — ABNORMAL LOW (ref >60)
GFR calc non Af Amer: 40 mL/min — ABNORMAL LOW (ref >60)
Glucose, Bld: 116 mg/dL — ABNORMAL HIGH (ref 65–99)
Potassium: 5.3 mmol/L — ABNORMAL HIGH (ref 3.5–5.1)
Sodium: 140 mmol/L (ref 135–145)
TOTAL PROTEIN: 6.3 g/dL — AB (ref 6.5–8.1)
Total Bilirubin: 0.4 mg/dL (ref 0.3–1.2)

## 2014-10-30 LAB — CBC
HEMATOCRIT: 36 % (ref 36.0–46.0)
HEMOGLOBIN: 11 g/dL — AB (ref 12.0–15.0)
MCH: 32.3 pg (ref 26.0–34.0)
MCHC: 30.6 g/dL (ref 30.0–36.0)
MCV: 105.6 fL — ABNORMAL HIGH (ref 78.0–100.0)
Platelets: 155 10*3/uL (ref 150–400)
RBC: 3.41 MIL/uL — AB (ref 3.87–5.11)
RDW: 15.7 % — ABNORMAL HIGH (ref 11.5–15.5)
WBC: 14.1 10*3/uL — ABNORMAL HIGH (ref 4.0–10.5)

## 2014-10-30 LAB — PROTIME-INR
INR: 1.16 (ref 0.00–1.49)
Prothrombin Time: 15 seconds (ref 11.6–15.2)

## 2014-10-30 LAB — LACTIC ACID, PLASMA: Lactic Acid, Venous: 1 mmol/L (ref 0.5–2.0)

## 2014-10-30 LAB — TROPONIN I
Troponin I: <0.03 ng/mL (ref <0.031)
Troponin I: <0.03 ng/mL (ref <0.031)

## 2014-10-30 LAB — APTT: APTT: 36 s (ref 24–37)

## 2014-10-30 MED ORDER — DICLOFENAC SODIUM 1 % TD GEL
2.0000 g | Freq: Four times a day (QID) | TRANSDERMAL | Status: DC
  Administered 2014-10-30 – 2014-11-02 (×13): 2 g via TOPICAL
  Filled 2014-10-30: qty 100

## 2014-10-30 MED ORDER — SODIUM CHLORIDE 0.9 % IJ SOLN
3.0000 mL | Freq: Two times a day (BID) | INTRAMUSCULAR | Status: DC
  Administered 2014-10-30 – 2014-11-02 (×7): 3 mL via INTRAVENOUS

## 2014-10-30 MED ORDER — CHOLESTYRAMINE 4 G PO PACK
4.0000 g | PACK | Freq: Every day | ORAL | Status: DC | PRN
  Filled 2014-10-30: qty 1

## 2014-10-30 MED ORDER — ASPIRIN EC 81 MG PO TBEC
81.0000 mg | DELAYED_RELEASE_TABLET | Freq: Every day | ORAL | Status: DC
  Administered 2014-10-30 – 2014-11-01 (×3): 81 mg via ORAL
  Filled 2014-10-30 (×3): qty 1

## 2014-10-30 MED ORDER — NALOXONE HCL 0.4 MG/ML IJ SOLN
0.4000 mg | INTRAMUSCULAR | Status: AC
  Administered 2014-10-30: 0.4 mg via INTRAVENOUS

## 2014-10-30 MED ORDER — NALOXONE HCL 0.4 MG/ML IJ SOLN
INTRAMUSCULAR | Status: AC
  Filled 2014-10-30: qty 1

## 2014-10-30 MED ORDER — SODIUM CHLORIDE 0.9 % IV BOLUS (SEPSIS)
500.0000 mL | Freq: Once | INTRAVENOUS | Status: AC
  Administered 2014-10-30: 500 mL via INTRAVENOUS

## 2014-10-30 MED ORDER — CHLORHEXIDINE GLUCONATE CLOTH 2 % EX PADS
6.0000 | MEDICATED_PAD | Freq: Every day | CUTANEOUS | Status: DC
  Administered 2014-10-30: 6 via TOPICAL

## 2014-10-30 MED ORDER — POTASSIUM CHLORIDE CRYS ER 20 MEQ PO TBCR: 20.0000 meq | EXTENDED_RELEASE_TABLET | Freq: Every day | ORAL | Status: DC

## 2014-10-30 MED ORDER — LEVOFLOXACIN IN D5W 750 MG/150ML IV SOLN
750.0000 mg | Freq: Once | INTRAVENOUS | Status: AC
  Administered 2014-10-30: 750 mg via INTRAVENOUS
  Filled 2014-10-30: qty 150

## 2014-10-30 MED ORDER — LEVOTHYROXINE SODIUM 25 MCG PO TABS
25.0000 ug | ORAL_TABLET | Freq: Every day | ORAL | Status: DC
  Administered 2014-10-30 – 2014-11-01 (×3): 25 ug via ORAL
  Filled 2014-10-30 (×3): qty 1

## 2014-10-30 MED ORDER — CLONAZEPAM 0.5 MG PO TABS
0.2500 mg | ORAL_TABLET | Freq: Every day | ORAL | Status: DC
  Administered 2014-10-30 – 2014-10-31 (×3): 0.25 mg via ORAL
  Filled 2014-10-30 (×3): qty 1

## 2014-10-30 MED ORDER — ONDANSETRON HCL 4 MG PO TABS: 4.0000 mg | ORAL_TABLET | Freq: Two times a day (BID) | ORAL | Status: DC | PRN

## 2014-10-30 MED ORDER — ONDANSETRON HCL 4 MG/2ML IJ SOLN
4.0000 mg | Freq: Four times a day (QID) | INTRAMUSCULAR | Status: DC | PRN
  Administered 2014-11-01 (×2): 4 mg via INTRAVENOUS
  Filled 2014-10-30 (×2): qty 2

## 2014-10-30 MED ORDER — MUPIROCIN 2 % EX OINT
1.0000 "application " | TOPICAL_OINTMENT | Freq: Two times a day (BID) | CUTANEOUS | Status: DC
  Administered 2014-10-30 – 2014-11-02 (×7): 1 via NASAL
  Filled 2014-10-30: qty 22

## 2014-10-30 MED ORDER — POLYVINYL ALCOHOL 1.4 % OP SOLN
1.0000 [drp] | Freq: Two times a day (BID) | OPHTHALMIC | Status: DC
  Administered 2014-10-30 – 2014-11-02 (×7): 1 [drp] via OPHTHALMIC
  Filled 2014-10-30 (×2): qty 15

## 2014-10-30 MED ORDER — LEVOFLOXACIN IN D5W 500 MG/100ML IV SOLN
500.0000 mg | INTRAVENOUS | Status: DC
  Administered 2014-11-01: 500 mg via INTRAVENOUS
  Filled 2014-10-30: qty 100

## 2014-10-30 MED ORDER — METRONIDAZOLE IN NACL 5-0.79 MG/ML-% IV SOLN
500.0000 mg | Freq: Three times a day (TID) | INTRAVENOUS | Status: DC
  Administered 2014-10-30 – 2014-11-02 (×9): 500 mg via INTRAVENOUS
  Filled 2014-10-30 (×10): qty 100

## 2014-10-30 MED ORDER — ENOXAPARIN SODIUM 30 MG/0.3ML ~~LOC~~ SOLN
30.0000 mg | SUBCUTANEOUS | Status: DC
  Administered 2014-10-30 – 2014-11-02 (×4): 30 mg via SUBCUTANEOUS
  Filled 2014-10-30 (×4): qty 0.3

## 2014-10-30 MED ORDER — LISINOPRIL 2.5 MG PO TABS
2.5000 mg | ORAL_TABLET | Freq: Every day | ORAL | Status: DC
  Filled 2014-10-30: qty 1

## 2014-10-30 MED ORDER — PANTOPRAZOLE SODIUM 40 MG PO TBEC
40.0000 mg | DELAYED_RELEASE_TABLET | Freq: Every day | ORAL | Status: DC
  Administered 2014-10-31 – 2014-11-01 (×2): 40 mg via ORAL
  Filled 2014-10-30 (×2): qty 1

## 2014-10-30 MED ORDER — CLINDAMYCIN HCL 300 MG PO CAPS: 600.0000 mg | ORAL_CAPSULE | ORAL | Status: DC

## 2014-10-30 MED ORDER — FUROSEMIDE 40 MG PO TABS
40.0000 mg | ORAL_TABLET | Freq: Every day | ORAL | Status: DC
  Administered 2014-10-30 – 2014-10-31 (×2): 40 mg via ORAL
  Filled 2014-10-30 (×2): qty 1

## 2014-10-30 MED ORDER — NOREPINEPHRINE BITARTRATE 1 MG/ML IV SOLN
0.0000 ug/min | INTRAVENOUS | Status: AC
  Administered 2014-10-30: 2 ug/min via INTRAVENOUS
  Administered 2014-10-31: 16 ug/min via INTRAVENOUS
  Administered 2014-10-31: 20 ug/min via INTRAVENOUS
  Administered 2014-10-31: 16 ug/min via INTRAVENOUS
  Administered 2014-10-31: 10 ug/min via INTRAVENOUS
  Administered 2014-10-31: 12 ug/min via INTRAVENOUS
  Administered 2014-10-31: 14 ug/min via INTRAVENOUS
  Administered 2014-11-01 (×2): 20 ug/min via INTRAVENOUS
  Administered 2014-11-01: 12 ug/min via INTRAVENOUS
  Administered 2014-11-01: 23 ug/min via INTRAVENOUS
  Administered 2014-11-01: 19 ug/min via INTRAVENOUS
  Administered 2014-11-01: 25 ug/min via INTRAVENOUS
  Administered 2014-11-02: 19 ug/min via INTRAVENOUS
  Administered 2014-11-02 (×2): 20 ug/min via INTRAVENOUS
  Filled 2014-10-30 (×13): qty 4

## 2014-10-30 MED ORDER — MORPHINE SULFATE 2 MG/ML IJ SOLN: 1.0000 mg | INTRAMUSCULAR | Status: DC | PRN

## 2014-10-30 MED ORDER — NALOXONE HCL 0.4 MG/ML IJ SOLN
0.4000 mg | INTRAMUSCULAR | Status: DC | PRN
  Administered 2014-10-30 – 2014-11-01 (×2): 0.4 mg via INTRAVENOUS
  Filled 2014-10-30: qty 1

## 2014-10-30 MED ORDER — ACETAMINOPHEN 325 MG PO TABS
650.0000 mg | ORAL_TABLET | ORAL | Status: DC | PRN
  Administered 2014-10-30 – 2014-11-01 (×9): 650 mg via ORAL
  Filled 2014-10-30 (×10): qty 2

## 2014-10-30 MED ORDER — KETOROLAC TROMETHAMINE 15 MG/ML IJ SOLN
15.0000 mg | Freq: Once | INTRAMUSCULAR | Status: AC
  Administered 2014-10-30: 15 mg via INTRAVENOUS
  Filled 2014-10-30: qty 1

## 2014-10-30 NOTE — Progress Notes (Signed)
Patient was positive for the MRSA by PCR. PCP on call was notified.

## 2014-10-30 NOTE — Progress Notes (Addendum)
ANTIBIOTIC CONSULT NOTE - INITIAL  Pharmacy Consult for Levaquin Indication: CAP  Allergies  Allergen Reactions  . Penicillins Other (See Comments)    Childhood allergy; reaction unknown    Patient Measurements: Height: 4\' 8"  (142.2 cm) Weight: 141 lb 1.5 oz (64 kg) IBW/kg (Calculated) : 36.3 Adjusted Body Weight:   Vital Signs: Temp: 97.5 F (36.4 C) (08/14 1314) Temp Source: Axillary (08/14 1314) BP: 78/23 mmHg (08/14 1530) Pulse Rate: 79 (08/14 1530) Intake/Output from previous day:   Intake/Output from this shift:    Labs:  Recent Labs  10/25/2014 2334 10/30/14 0533 10/30/14 1512  WBC 8.8  --  14.1*  HGB 13.1  --  11.0*  PLT 184  --  155  CREATININE 1.11* 1.13*  --    Estimated Creatinine Clearance: 21.8 mL/min (by C-G formula based on Cr of 1.13). No results for input(s): VANCOTROUGH, VANCOPEAK, VANCORANDOM, GENTTROUGH, GENTPEAK, GENTRANDOM, TOBRATROUGH, TOBRAPEAK, TOBRARND, AMIKACINPEAK, AMIKACINTROU, AMIKACIN in the last 72 hours.   Microbiology: Recent Results (from the past 720 hour(s))  MRSA PCR Screening     Status: Abnormal   Collection Time: 10/30/14  2:48 AM  Result Value Ref Range Status   MRSA by PCR POSITIVE (A) NEGATIVE Final    Comment:        The GeneXpert MRSA Assay (FDA approved for NASAL specimens only), is one component of a comprehensive MRSA colonization surveillance program. It is not intended to diagnose MRSA infection nor to guide or monitor treatment for MRSA infections. RESULT CALLED TO, READ BACK BY AND VERIFIED WITH: J.SCOTTON,RN AT 0517 ON 10/30/14 BY W.SHEA     Medical History: Past Medical History  Diagnosis Date  . Arthritis   . Coronary artery disease   . GERD (gastroesophageal reflux disease)   . Anemia   . LBBB (left bundle branch block)   . Varicose vein of leg     Assessment: 89 yoF brought to ED from NH after fall from wheelchair complaining of right shoulder and right chest all pain.  After admission,  pt found unresponsive with hypotension which improved with NS and IV narcan.  PMHx includes hypothyroidism, CAD, CHF, LBBB, and GERD.  Chest x-ray cannot exclude infiltrate/PNA and pharmacy consulted to start levaquin for CAP. MD dosing flagyl.   Anti-infectives 8/14 >> levaquin  >> 8/14 >> flagyl  >>    Vitals/Labs WBC: 14.1 Tm24h: Afebrile Lactic acid: 1.0 SCr 1.13, CrCl ~22 ml/min (N32)  Cultures MRSA PCR+  Goal of Therapy:  Eradication of infection  Plan:  Levaquin 750mg  IV x1, then 500mg  IV q48h  Follow-up renal function closely to adjust dose Follow-up if additional cultures/imaging available, clinical course  Ralene Bathe, PharmD, BCPS 10/30/2014, 4:02 PM  Pager: [phone]

## 2014-10-30 NOTE — H&P (Signed)
[facility] History and Physical  [name] GEX:[id number] DOB: 12-Mar-1916 DOA: 11/03/2014  Referring physician: Nat Christen, M.D. PCP: Reymundo Poll, MD   Chief Complaint: [name].  HPI: [name] is a 81 y.o. female with below past medical history who is brought to the emergency department due to right shoulder and right chest wall pain after having a fall at a local nursing home while she was sitting on her wheelchair. She denies loss of consciousness and states that this was an accident. She denies chest pain, palpitations, dizziness, diaphoresis, orthopnea, PND, but complains of occasional lower extremities edema.  She states that she initially was on significant pain, but feels much better now that analgesics were given. She is currently in no acute distress.  Review of Systems:  Constitutional:  No weight loss, night sweats, Fevers, chills, fatigue.  HEENT:  No headaches, Difficulty swallowing,Tooth/dental problems,Sore throat,  No sneezing, itching, ear ache, nasal congestion, post nasal drip,  Cardio-vascular:  No chest pain, Orthopnea, PND, swelling in lower extremities, anasarca, dizziness, palpitations  GI: Had a single episode of diarrhea earlier in the day, but denies other symptoms. No heartburn, indigestion, abdominal pain, nausea, vomiting, change in bowel habits, loss of appetite  Resp:  No shortness of breath with exertion or at rest. No excess mucus, no productive cough, No non-productive cough, No coughing up of blood.No change in color of mucus.No wheezing.No chest wall deformity  Skin:  no rash or lesions.  GU:  no dysuria, change in color of urine, no urgency or frequency. No flank pain.  Musculoskeletal:  Positive right shoulder pain, right-sided chest wall and right-sided thoracic back pain.  Psych:  No change in mood or affect. No depression or anxiety. No memory loss.   Past Medical History  Diagnosis Date  . Arthritis   . Coronary  artery disease   . GERD (gastroesophageal reflux disease)   . Anemia   . LBBB (left bundle branch block)   . Varicose vein of leg    Past Surgical History  Procedure Laterality Date  . Hip fracture surgery      x 3  . Orif femur fracture Left 12/13/2012    Procedure: OPEN REDUCTION INTERNAL FIXATION (ORIF) DISTAL FEMUR FRACTURE;  Surgeon: Mauri Pole, MD;  Location: [facility];  Service: Orthopedics;  Laterality: Left;  . Appendectomy    . Cholecystectomy    . Kyphoplasty     Social History:  reports that she has never smoked. She has never used smokeless tobacco. She reports that she does not drink alcohol or use illicit drugs.  Allergies  Allergen Reactions  . Penicillins Other (See Comments)    Childhood allergy; reaction unknown    Family History  Problem Relation Age of Onset  . Diabetes Maternal Grandmother   . Diabetes Paternal Grandmother     Prior to Admission medications   Medication Sig Start Date End Date Taking? Authorizing Provider  aspirin EC 81 MG EC tablet Take 1 tablet (81 mg total) by mouth daily. 10/04/13  Yes Janece Canterbury, MD  cholestyramine Lucrezia Starch) 4 G packet Take 4 g by mouth daily as needed (for loose stools).   Yes Historical Provider, MD  clonazePAM (KLONOPIN) 0.5 MG tablet Take 1 tablet (0.5 mg total) by mouth at bedtime. Patient taking differently: Take 0.25 mg by mouth at bedtime.  10/04/13  Yes Tiffany L Reed, DO  Eyelid Cleansers (OCUSOFT LID SCRUB) PADS Place 1 each into both eyes 2 (two) times  daily.   Yes Historical Provider, MD  furosemide (LASIX) 40 MG tablet Take 40 mg by mouth daily.   Yes Historical Provider, MD  levothyroxine (SYNTHROID, LEVOTHROID) 25 MCG tablet Take 25 mcg by mouth daily before breakfast.   Yes Historical Provider, MD  lisinopril (PRINIVIL,ZESTRIL) 2.5 MG tablet Take 2.5 mg by mouth daily.   Yes Historical Provider, MD  Multiple Vitamins-Minerals (THEREMS M PO) Take 1 tablet by mouth daily with breakfast.   Yes  Historical Provider, MD  oxyCODONE-acetaminophen (PERCOCET/ROXICET) 5-325 MG per tablet Take one to two tablets by mouth every 4 hours as needed for moderate to severe pain. Administer prior before painful procedures 10/04/13  Yes Tiffany L Reed, DO  polyvinyl alcohol (LIQUIFILM TEARS) 1.4 % ophthalmic solution Place 1 drop into both eyes 2 (two) times daily.   Yes Historical Provider, MD  potassium chloride SA (K-DUR,KLOR-CON) 20 MEQ tablet Take 20 mEq by mouth daily.   Yes Historical Provider, MD  Probiotic Product (PROBIOTIC DAILY) CAPS Take by mouth daily.   Yes Historical Provider, MD  acetaminophen (TYLENOL) 325 MG tablet Take 2 tablets (650 mg total) by mouth every 6 (six) hours as needed for mild pain (or Fever >/= 101). Patient not taking: Reported on 11/10/2014 10/04/13   Janece Canterbury, MD  ciprofloxacin (CIPRO) 500 MG tablet Take 1 tablet (500 mg total) by mouth 2 (two) times daily. One po bid x 7 days Patient not taking: Reported on 05/20/2014 04/23/14   Ernestina Patches, MD  clindamycin (CLEOCIN) 300 MG capsule Take 600 mg by mouth See admin instructions. Takes 600mg  as needed 1-hour prior to any scheduled dental appointment    Historical Provider, MD  docusate sodium 100 MG CAPS Take 100 mg by mouth 2 (two) times daily. Patient taking differently: Take 100 mg by mouth daily as needed. For stool softner 10/04/13   Janece Canterbury, MD  enoxaparin (LOVENOX) 30 MG/0.3ML injection Inject 0.3 mLs (30 mg total) into the skin daily. Lovenox injection daily for one more week and then discontinue. Patient not taking: Reported on 04/23/2014 10/04/13   Arlee Muslim, PA-C  feeding supplement, ENSURE COMPLETE, (ENSURE COMPLETE) LIQD Take 237 mLs by mouth 3 (three) times daily between meals. Patient not taking: Reported on 04/23/2014 05/29/13   Delfina Redwood, MD  Menthol, Topical Analgesic, 4 % GEL Apply topically 2 (two) times daily.    Historical Provider, MD  omeprazole (PRILOSEC) 40 MG capsule Take 1  capsule (40 mg total) by mouth every morning. Patient not taking: Reported on 10/23/2014 05/29/13   Delfina Redwood, MD  ondansetron [facility]) 4 MG tablet Take 4 mg by mouth 2 (two) times daily as needed for nausea or vomiting. 10/01/13   Historical Provider, MD  polyethylene glycol (MIRALAX / GLYCOLAX) packet Take 17 g by mouth daily as needed for mild constipation.    Historical Provider, MD   Physical Exam: Filed Vitals:   11/01/2014 2300 10/21/2014 2313 10/25/2014 2330 10/30/14 0118  BP: 137/63   113/42  Pulse: 63   74  Temp:      TempSrc:      Resp: 26   16  SpO2: 91% 50% 100% 100%    Wt Readings from Last 3 Encounters:  04/23/14 63.957 kg (141 lb)  12/28/13 63.231 kg (139 lb 6.4 oz)  12/14/13 64.955 kg (143 lb 3.2 oz)    General:  Appears calm and comfortable Eyes: PERRL, normal lids, irises & conjunctiva ENT: grossly normal hearing, lips & tongue are  mildly dry. Neck: no LAD, masses or thyromegaly Cardiovascular: RRR, no m/r/g. No LE edema. Telemetry: SR, no arrhythmias  Respiratory: CTA bilaterally, no w/r/r. Normal respiratory effort. Abdomen: soft, ntnd Back: Unable to fully evaluate, up patient has right sided chest wall pain on palpation. Skin: no rash or induration seen on limited exam Musculoskeletal: Right shoulder tenderness on palpation. Psychiatric: Sleepy, but arousable. Her speech fluent and appropriate Neurologic: grossly non-focal.          Labs on Admission:  Basic Metabolic Panel:  Recent Labs Lab 10/31/2014 2334  NA 142  K 4.9  CL 102  CO2 31  GLUCOSE 139*  BUN 38*  CREATININE 1.11*  CALCIUM 9.5   Liver Function Tests: No results for input(s): AST, ALT, ALKPHOS, BILITOT, PROT, ALBUMIN in the last 168 hours. No results for input(s): LIPASE, AMYLASE in the last 168 hours. No results for input(s): AMMONIA in the last 168 hours. CBC:  Recent Labs Lab 10/30/2014 2334  WBC 8.8  NEUTROABS 5.4  HGB 13.1  HCT 41.0  MCV 104.1*  PLT 184    Cardiac Enzymes:  Recent Labs Lab 10/30/2014 2334  TROPONINI <0.03     Radiological Exams on Admission: Dg Thoracic Spine 2 View  10/19/2014   CLINICAL DATA:  Status post fall, with upper back pain. Initial encounter.  EXAM: THORACIC SPINE 2 VIEWS  COMPARISON:  Chest radiograph performed 04/23/2014  FINDINGS: There is no evidence of acute fracture or subluxation. Chronic compression deformities are noted at vertebral bodies T6, T9, T10 and T11, with changes of vertebroplasty at T10. Degenerative change is noted along the upper lumbar spine.  The visualized portions of both lungs are grossly clear. The mediastinum is unremarkable in appearance.  IMPRESSION: 1. No evidence of acute fracture or subluxation along the thoracic spine. 2. Chronic compression deformities at T6, T9, T10 and T11, with changes of vertebroplasty at T10.   Electronically Signed   By: Garald Balding M.D.   On: 10/20/2014 22:29    EKG: Independently reviewed. Pacemaker spikes or artifacts, sinus rhythm at 78 bpm, left bundle branch block.  Assessment/Plan Principal Problem:   Intractable back pain   Chest pain (atypical).   Admit for treatment with analgesics and antiemetics when necessary. Continue telemetry monitoring.  serial troponin levels.  Active Problems:   Esophageal reflux   Hypothyroidism   Compensated CHF (congestive heart failure)  Continue pertinent current medications. I will hold the stool softeners and constipation treatment since the patient had an episode of diarrhea yesterday.     Code Status: Full code. DVT Prophylaxis: Lovenox SQ. Family Communication: Her son Clare Gandy was in the room and provided most of the information. [place], [place] [phone]  [phone]  Disposition Plan: Discharge back to skilled nursing facility later today if pain level has been controlled  Time spent: Over 70 minutes.  Reubin Milan [facility] Pager [phone].

## 2014-10-30 NOTE — Progress Notes (Signed)
PT Cancellation Note  Patient Details Name: [name] MRN: [id number] DOB: 01/01/1916   Cancelled Treatment:    Reason Eval/Treat Not Completed: Medical issues which prohibited therapy. Noted pt to have a rapid response event this am.  Spoke with RN prior to session to see if ok to work with pt. RN was okay with attempt at PT eval. Checked BP at start of session: BP 72/30 1st time, 67/29 2nd time-made RN (Myrna) aware. Will hold PT today.    Weston Anna, MPT Pager: [phone]

## 2014-10-30 NOTE — Progress Notes (Signed)
Found pr 10:00 am unresponsive with low BP . O2 Sats in the 90's . Md notified. Rapid response team notified. Pt was given a bolus of NS with Noracan IV as order by MD . Pt became more alert and BP 86/36 has improved. Will continue to monitor

## 2014-10-30 NOTE — Progress Notes (Signed)
Pt was cleaned and changed. Noted redness to buttocks no open skin, dressing applied

## 2014-10-30 NOTE — Progress Notes (Signed)
[facility] Progress Note   LADELLE TEODORO Holden:[id number] DOB: October 03, 1915 DOA: 11/07/2014 PCP: Reymundo Poll, MD  Brief narrative: [name] is a 81 y.o. female with below past medical history who is brought to the emergency department due to right shoulder and right chest wall pain after having a fall. She was sitting in a chair playing a game and fell backward in her chair.   Subjective: Was complaining of right sided back pain this AM. No other complaitns of dyspnea, cough, palpitations, nausea. She was alert and oriented at that time. subsequently when RN evaluated her, she was obtunded with SBP in 70s. She awoke after Narcan given. BP did not improve. She was given a fluid bolus and SBP improved to 90s. She was found later by PT to have an SBP in the 70s again. Transferred to SDU- given 1 more amp of Narcan with no change- giving another bolus.    Assessment/Plan: Principal Problem:   Intractable back pain - admitted for r/o ACA- troponin neg - pain is musculoskeletal - Voltaren gel- she is tender medial to her right scapula - avoid narcotics   Active Problems: Unresponsive - resolved after Narcan- she received Fentanyl and Morphine for above pain last night  Sepsis   (A) Hypotension-  - MAP is > 50 at this time (confirmed with nurse in SDU) an she is alert and oriented - will give one more bolus of 500 cc NS - Lactic acid normal  (B) Hypoxia - pulse ox 97% on 3 L- CXR shows RUL infiltrate- WBC count has risen since admission- ? Aspiration vs CAP- start Levaquin and Flagyl    Esophageal reflux - cont PPI    Hypothyroidism -cont synthroid    CHF? - on Lasix at home- will obtain ECHO   Code Status: DNR- confirmed with son today Family Communication: son- Kaija Kovacevic POA Disposition Plan: follow in SDU DVT prophylaxis: Lovenox Consultants: Procedures:  Antibiotics: Anti-infectives    Start     Dose/Rate Route Frequency Ordered Stop   11/01/14 1800   levofloxacin (LEVAQUIN) IVPB 500 mg     500 mg 100 mL/hr over 60 Minutes Intravenous Every 48 hours 10/30/14 1540     10/30/14 1600  metroNIDAZOLE (FLAGYL) IVPB 500 mg     500 mg 100 mL/hr over 60 Minutes Intravenous Every 8 hours 10/30/14 1535     10/30/14 1545  levofloxacin (LEVAQUIN) IVPB 750 mg     750 mg 100 mL/hr over 90 Minutes Intravenous  Once 10/30/14 1540     10/30/14 0218  clindamycin (CLEOCIN) capsule 600 mg  Status:  Discontinued     600 mg Oral See admin instructions 10/30/14 0218 10/30/14 0223      Objective: Filed Weights   10/30/14 0219  Weight: 64 kg (141 lb 1.5 oz)   No intake or output data in the 24 hours ending 10/30/14 1614   Vitals Filed Vitals:   10/30/14 1051 10/30/14 1314 10/30/14 1338 10/30/14 1530  BP: 105/37 74/35 76/44  78/23  Pulse: 86 70  79  Temp:  97.5 F (36.4 C)    TempSrc:  Axillary    Resp:  16  22  Height:      Weight:      SpO2:  98%  99%    Exam:  General:  Pt is alert, not in acute distress  HEENT: No icterus, No thrush, oral mucosa moist  Cardiovascular: regular rate and rhythm, S1/S2 No murmur  Back: very tender medial  to and inferior to right scapula  Respiratory: clear to auscultation bilaterally   Abdomen: Soft, +Bowel sounds, non tender, non distended, no guarding  MSK: No LE edema, cyanosis or clubbing  Data Reviewed: Basic Metabolic Panel:  Recent Labs Lab 10/19/2014 2334 10/30/14 0533 10/30/14 1512  NA 142 140 141  K 4.9 5.3* 5.5*  CL 102 107 106  CO2 31 27 28   GLUCOSE 139* 116* 138*  BUN 38* 37* 43*  CREATININE 1.11* 1.13* 1.42*  CALCIUM 9.5 8.6* 8.4*   Liver Function Tests:  Recent Labs Lab 10/30/14 0533  AST 37  ALT 31  ALKPHOS 113  BILITOT 0.4  PROT 6.3*  ALBUMIN 3.0*   No results for input(s): LIPASE, AMYLASE in the last 168 hours. No results for input(s): AMMONIA in the last 168 hours. CBC:  Recent Labs Lab 11/14/2014 2334 10/30/14 1512  WBC 8.8 14.1*  NEUTROABS 5.4  --    HGB 13.1 11.0*  HCT 41.0 36.0  MCV 104.1* 105.6*  PLT 184 155   Cardiac Enzymes:  Recent Labs Lab 10/24/2014 2334 10/30/14 0533 10/30/14 1125  TROPONINI <0.03 <0.03 <0.03   BNP (last 3 results) No results for input(s): BNP in the last 8760 hours.  ProBNP (last 3 results) No results for input(s): PROBNP in the last 8760 hours.  CBG: No results for input(s): GLUCAP in the last 168 hours.  Recent Results (from the past 240 hour(s))  MRSA PCR Screening     Status: Abnormal   Collection Time: 10/30/14  2:48 AM  Result Value Ref Range Status   MRSA by PCR POSITIVE (A) NEGATIVE Final    Comment:        The GeneXpert MRSA Assay (FDA approved for NASAL specimens only), is one component of a comprehensive MRSA colonization surveillance program. It is not intended to diagnose MRSA infection nor to guide or monitor treatment for MRSA infections. RESULT CALLED TO, READ BACK BY AND VERIFIED WITH: J.SCOTTON,RN AT 0517 ON 10/30/14 BY W.SHEA      Studies: Dg Thoracic Spine 2 View  11/12/2014   CLINICAL DATA:  Status post fall, with upper back pain. Initial encounter.  EXAM: THORACIC SPINE 2 VIEWS  COMPARISON:  Chest radiograph performed 04/23/2014  FINDINGS: There is no evidence of acute fracture or subluxation. Chronic compression deformities are noted at vertebral bodies T6, T9, T10 and T11, with changes of vertebroplasty at T10. Degenerative change is noted along the upper lumbar spine.  The visualized portions of both lungs are grossly clear. The mediastinum is unremarkable in appearance.  IMPRESSION: 1. No evidence of acute fracture or subluxation along the thoracic spine. 2. Chronic compression deformities at T6, T9, T10 and T11, with changes of vertebroplasty at T10.   Electronically Signed   By: Garald Balding M.D.   On: 10/23/2014 22:29   Dg Chest Port 1 View  10/30/2014   CLINICAL DATA:  Hypoxia, unresponsive  EXAM: PORTABLE CHEST - 1 VIEW  COMPARISON:  04/23/2014  FINDINGS:  Cardiomediastinal silhouette is stable. There is hazy airspace disease right upper lobe and right perihilar. Infiltrate/ pneumonia cannot be excluded. Left lung is clear. Tortuous upper trachea. Degenerative changes bilateral shoulders.  IMPRESSION: Hazy airspace disease in right upper lobe and right perihilar region. Infiltrate/ pneumonia cannot be excluded. Degenerative changes bilateral shoulders.   Electronically Signed   By: Lahoma Crocker M.D.   On: 10/30/2014 12:36    Scheduled Meds:  Scheduled Meds: . aspirin EC  81 mg Oral Daily  .  Chlorhexidine Gluconate Cloth  6 each Topical Q0600  . clonazePAM  0.25 mg Oral QHS  . diclofenac sodium  2 g Topical QID  . enoxaparin (LOVENOX) injection  30 mg Subcutaneous Q24H  . furosemide  40 mg Oral Daily  . levofloxacin (LEVAQUIN) IV  750 mg Intravenous Once   Followed by  . [START ON 11/01/2014] levofloxacin (LEVAQUIN) IV  500 mg Intravenous Q48H  . levothyroxine  25 mcg Oral QAC breakfast  . metronidazole  500 mg Intravenous Q8H  . mupirocin ointment  1 application Nasal BID  . pantoprazole  40 mg Oral Daily  . polyvinyl alcohol  1 drop Both Eyes BID  . sodium chloride  3 mL Intravenous Q12H   Continuous Infusions:   Time spent on care of this patient: 62 min   [place], MD 10/30/2014, 4:14 PM    [facility] Office  [phone] Pager - Text Page per [email or web address] If 7PM-7AM, please contact night-coverage [email or web address]

## 2014-10-30 NOTE — Progress Notes (Signed)
Report given to ICU nurse.

## 2014-10-30 NOTE — Progress Notes (Signed)
Narcan

## 2014-10-31 ENCOUNTER — Inpatient Hospital Stay (HOSPITAL_COMMUNITY): Payer: Medicare Other

## 2014-10-31 DIAGNOSIS — I509 Heart failure, unspecified: Secondary | ICD-10-CM

## 2014-10-31 LAB — CBC
HCT: 38.3 % (ref 36.0–46.0)
Hemoglobin: 12 g/dL (ref 12.0–15.0)
MCH: 33.5 pg (ref 26.0–34.0)
MCHC: 31.3 g/dL (ref 30.0–36.0)
MCV: 107 fL — AB (ref 78.0–100.0)
PLATELETS: 181 10*3/uL (ref 150–400)
RBC: 3.58 MIL/uL — ABNORMAL LOW (ref 3.87–5.11)
RDW: 15.9 % — AB (ref 11.5–15.5)
WBC: 10.3 10*3/uL (ref 4.0–10.5)

## 2014-10-31 LAB — BASIC METABOLIC PANEL
Anion gap: 9 (ref 5–15)
BUN: 42 mg/dL — AB (ref 6–20)
CALCIUM: 8.5 mg/dL — AB (ref 8.9–10.3)
CHLORIDE: 106 mmol/L (ref 101–111)
CO2: 24 mmol/L (ref 22–32)
CREATININE: 1.35 mg/dL — AB (ref 0.44–1.00)
GFR calc non Af Amer: 32 mL/min — ABNORMAL LOW (ref >60)
GFR, EST AFRICAN AMERICAN: 37 mL/min — AB (ref >60)
Glucose, Bld: 132 mg/dL — ABNORMAL HIGH (ref 65–99)
Potassium: 5.1 mmol/L (ref 3.5–5.1)
Sodium: 139 mmol/L (ref 135–145)

## 2014-10-31 MED ORDER — CETYLPYRIDINIUM CHLORIDE 0.05 % MT LIQD
7.0000 mL | Freq: Two times a day (BID) | OROMUCOSAL | Status: DC
  Administered 2014-10-31 – 2014-11-02 (×4): 7 mL via OROMUCOSAL

## 2014-10-31 MED ORDER — NALOXONE HCL 0.4 MG/ML IJ SOLN: 0.4000 mg | INTRAMUSCULAR | Status: DC | PRN

## 2014-10-31 MED ORDER — MORPHINE SULFATE (PF) 2 MG/ML IV SOLN
1.0000 mg | INTRAVENOUS | Status: DC | PRN
  Administered 2014-10-31 – 2014-11-01 (×2): 1 mg via INTRAVENOUS
  Filled 2014-10-31 (×2): qty 1

## 2014-10-31 MED ORDER — CHLORHEXIDINE GLUCONATE CLOTH 2 % EX PADS
6.0000 | MEDICATED_PAD | Freq: Every morning | CUTANEOUS | Status: DC
  Administered 2014-10-31 – 2014-11-02 (×3): 6 via TOPICAL

## 2014-10-31 NOTE — Clinical Social Work Placement (Signed)
   CLINICAL SOCIAL WORK PLACEMENT  NOTE  Date:  10/31/2014  Patient Details  Name: [name] MRN: [id number] Date of Birth: March 21, 1915  Clinical Social Work is seeking post-discharge placement for this patient at the [place] level of care (*CSW will initial, date and re-position this form in  chart as items are completed):  No   Patient/family provided with [place] Work Department's list of facilities offering this level of care within the geographic area requested by the patient (or if unable, by the patient's family).  Yes   Patient/family informed of their freedom to choose among providers that offer the needed level of care, that participate in Medicare, Medicaid or managed care program needed by the patient, have an available bed and are willing to accept the patient.  No   Patient/family informed of [facility]'s ownership interest in [facility] and [facility], as well as of the fact that they are under no obligation to receive care at these facilities.  PASRR submitted to EDS on       PASRR number received on       Existing PASRR number confirmed on 10/31/14     FL2 transmitted to all facilities in geographic area requested by pt/family on 10/31/14     FL2 transmitted to all facilities within larger geographic area on       Patient informed that his/her managed care company has contracts with or will negotiate with certain facilities, including the following:            Patient/family informed of bed offers received.  Patient chooses bed at       Physician recommends and patient chooses bed at      Patient to be transferred to   on  .  Patient to be transferred to facility by       Patient family notified on   of transfer.  Name of family member notified:        PHYSICIAN       Additional Comment:    _______________________________________________ Loraine Maple  [phone] 10/31/2014, 4:32  PM

## 2014-10-31 NOTE — Clinical Social Work Note (Signed)
Clinical Social Work Assessment  Patient Details  Name: [name] MRN: [id number] Date of Birth: May 10, 1915  Date of referral:  10/31/14               Reason for consult:  Discharge Planning, Facility Placement                Permission sought to share information with:  Facility Art therapist granted to share information::  Yes, Verbal Permission Granted  Name::        Agency::     Relationship::     Contact Information:     Housing/Transportation Living arrangements for the past 2 months:  [place] of Information:  Adult Children Patient Interpreter Needed:  None Criminal Activity/Legal Involvement Pertinent to Current Situation/Hospitalization:  No - Comment as needed Significant Relationships:  Adult Children Lives with:    Do you feel safe going back to the place where you live?   (SNF placement needed.) Need for family participation in patient care:  Yes (Comment)  Care giving concerns:  Son reports that pt will need ST Rehab before returning to ALF.   Social Worker assessment / plan:  Pt hospitalized on 10/30/14 with intractable back pain after falling at [facility] ( ALF ). Pt sleeping soundly when CSW attempted to visit. CSW spoke with pt's son, Caydee Talkington ( cell # [phone] ), to assist with d/c planning. Son thinks pt will need SNF placement at d/c. PT eval is pending. Pt has been to [facility] in the past and Mr. Legner requested [facility] be contacted to check availability. CSW has contacted SNF and available clinicals have been sent for review. PT eval will be sent once available. [facility] has not yet made a bed offer. CSW will continue to follow to assist with d/c planning.  Employment status:  Retired Forensic scientist:  Medicare PT Recommendations:  Not assessed at this time [place] / Referral to community resources:  [place]  Patient/Family's Response to care:  D/C plan has  not yet been determined ( ALF vs SNF ).  Patient/Family's Understanding of and Emotional Response to Diagnosis, Current Treatment, and Prognosis:  Pt's understanding of her medical status is unclear. Pt has a dx of dementia and is forgetful. Son is aware of pt's medical concerns. He feels ALF will have difficulty managing pt's care at d/c.  Emotional Assessment Appearance:  Appears stated age Attitude/Demeanor/Rapport:  Unable to Assess Affect (typically observed):  Unable to Assess Orientation:  Oriented to Self, Oriented to Place, Oriented to Situation Alcohol / Substance use:  Not Applicable Psych involvement (Current and /or in the community):  No (Comment)  Discharge Needs  Concerns to be addressed:  Discharge Planning Concerns Readmission within the last 30 days:  No Current discharge risk:  None Barriers to Discharge:  No Barriers Identified   Luretha Rued, [place] 10/31/2014, 4:18 PM

## 2014-10-31 NOTE — Progress Notes (Addendum)
[facility] Progress Note   CALEYAH JR HCW:[id number] DOB: [birth year] DOA: 11/01/2014 PCP: Reymundo Poll, MD  Brief narrative: [name] is a 81 y.o. female with below past medical history who is brought to the emergency department due to right shoulder and right chest wall pain after having a fall. She is usually very active and lives in an assisted living. She was sitting in a chair playing a game and fell backward in her chair. She became hypotensive on the morning after admission. This improved slightly with Narcan but she became hypotensive again a few hrs later. CXR revealed a R sided infiltrate, she was hypoxic as well. Started on treatment of pneumonia and on Levophed for septic shock as SBP continued to drop.   Subjective: Sleepy. Awakens a little bit more after Narcan. She has no complaints but is noted to have a congested cough. The nurse myself and and aid where able to get her out of bed and up in a chair this morning. PT declined to do PT on her yesterday.  Assessment/Plan: Principal Problem:   Intractable back pain - admitted for r/o ACS- troponin neg - pain is musculoskeletal - Voltaren gel- she is tender medial to her right scapula - pain had been controlled all day yesterday-She was given a small dose of morphine 1 mg last night at 12.30 AM as her pain was "severe" last night--unable to give Toradol due to renal failure   Active Problems: Unresponsive -On 8/14 a.m. - resolved after Narcan- she received Fentanyl and Morphine for above pain on the night she was admitted  Septic shock-acute hypoxic respiratory failure/right upper lobe pneumonia -Patient was hypoxic on 8/14 and a chest x-ray revealed a right upper lobe infiltrate - BP dropped to systolic of 17O despite 1 L of normal saline-Levophed started on 8/14- BP now > 100- weaning Levophed - pulse ox 99% on 2L now while she is sitting up in a chair - CXR 8/15 - RUL infiltrate which has increased in  size with an effusion -? Aspiration vs CAP- started Levaquin and Flagyl- would recommend at least a 7 day course    Esophageal reflux - cont PPI    Hypothyroidism -cont synthroid    CHF? Severe pulm HTN - on Lasix at home- - being given every daily at 40 mg orally-her weight is up again by 2 kg today-she had right lower lobe effusion on CXR yesterday but as her O2 requirement has improved significantly, will not repeat CXR - poor appetite today- Hyponatremic also- Hold lasix for tomorrow (given already today)- incontinent of urine but still urinating - will obtain ECHO- mod LVH EF 60& LA mildly dilated therefore may have some diastolic dysf-  Has severe pulm HTN-RV not visualized  see report below  ADDENDUM 3:12 PM: RN stating that she is not voiding today-  drank only a little today- will need to give small bolus of 250 cc and start slow NS at 75 cchr - also had an episode of chest congestion after head was reclined in the chair- had to be suctioned- hopefully did not aspirate again- Pulse ox now 98% on 3 L O2   Code Status: DNR- confirmed with son - OK to give pressors and BIPAP Family Communication: son- [name] POA Disposition Plan: follow in ICU, PT eval to determine if she needs to go from assisted living to SNF now- suspect SNF as she was quite weak this AM- will order social work consult DVT prophylaxis: Lovenox  Consultants: none Procedures: ECHO 10/31/14 Left ventricle: The cavity size was normal. Wall thickness was increased in a pattern of moderate LVH. The estimated ejection fraction was 60%. - Left atrium: The atrium was mildly dilated. - Right ventricle: RV is poorly seen. Function may be OK. - Pulmonary arteries: PA peak pressure: 85 mm Hg (S). Antibiotics: Anti-infectives    Start     Dose/Rate Route Frequency Ordered Stop   11/01/14 1800  levofloxacin (LEVAQUIN) IVPB 500 mg     500 mg 100 mL/hr over 60 Minutes Intravenous Every 48 hours 10/30/14 1540      10/30/14 1600  metroNIDAZOLE (FLAGYL) IVPB 500 mg     500 mg 100 mL/hr over 60 Minutes Intravenous Every 8 hours 10/30/14 1535     10/30/14 1545  levofloxacin (LEVAQUIN) IVPB 750 mg     750 mg 100 mL/hr over 90 Minutes Intravenous  Once 10/30/14 1540 10/30/14 1833   10/30/14 0218  clindamycin (CLEOCIN) capsule 600 mg  Status:  Discontinued     600 mg Oral See admin instructions 10/30/14 0218 10/30/14 0223      Objective: Filed Weights   10/30/14 0219 10/31/14 0610  Weight: 64 kg (141 lb 1.5 oz) 66.2 kg (145 lb 15.1 oz)    Intake/Output Summary (Last 24 hours) at 10/31/14 0903 Last data filed at 10/31/14 0700  Gross per 24 hour  Intake  801.7 ml  Output      0 ml  Net  801.7 ml     Vitals Filed Vitals:   10/31/14 0610 10/31/14 0630 10/31/14 0700 10/31/14 0800  BP:  87/29 92/23 90/37   Pulse:  71 73 68  Temp:    97.7 F (36.5 C)  TempSrc:    Axillary  Resp:  13 25 16   Height:      Weight: 66.2 kg (145 lb 15.1 oz)     SpO2:  100% 99% 100%    Exam:  General:  Pt is sleepy but wakable- , not in acute distress  HEENT: No icterus, No thrush, oral mucosa moist  Cardiovascular: regular rate and rhythm, S1/S2  2/6 murmur at RUS border  Back: Improved tenderness medial to and inferior to right scapula  Respiratory: clear to ausculation b/l today  Abdomen: Soft, +Bowel sounds, non tender, non distended, no guarding  MSK: No LE edema, cyanosis or clubbing  Data Reviewed: Basic Metabolic Panel:  Recent Labs Lab 10/19/2014 2334 10/30/14 0533 10/30/14 1512 10/31/14 0359  NA 142 140 141 139  K 4.9 5.3* 5.5* 5.1  CL 102 107 106 106  CO2 31 27 28 24   GLUCOSE 139* 116* 138* 132*  BUN 38* 37* 43* 42*  CREATININE 1.11* 1.13* 1.42* 1.35*  CALCIUM 9.5 8.6* 8.4* 8.5*   Liver Function Tests:  Recent Labs Lab 10/30/14 0533  AST 37  ALT 31  ALKPHOS 113  BILITOT 0.4  PROT 6.3*  ALBUMIN 3.0*   No results for input(s): LIPASE, AMYLASE in the last 168 hours. No  results for input(s): AMMONIA in the last 168 hours. CBC:  Recent Labs Lab 10/25/2014 2334 10/30/14 1512 10/31/14 0359  WBC 8.8 14.1* 10.3  NEUTROABS 5.4  --   --   HGB 13.1 11.0* 12.0  HCT 41.0 36.0 38.3  MCV 104.1* 105.6* 107.0*  PLT 184 155 181   Cardiac Enzymes:  Recent Labs Lab 10/28/2014 2334 10/30/14 0533 10/30/14 1125  TROPONINI <0.03 <0.03 <0.03   BNP (last 3 results) No results for input(s): BNP in  the last 8760 hours.  ProBNP (last 3 results) No results for input(s): PROBNP in the last 8760 hours.  CBG: No results for input(s): GLUCAP in the last 168 hours.  Recent Results (from the past 240 hour(s))  MRSA PCR Screening     Status: Abnormal   Collection Time: 10/30/14  2:48 AM  Result Value Ref Range Status   MRSA by PCR POSITIVE (A) NEGATIVE Final    Comment:        The GeneXpert MRSA Assay (FDA approved for NASAL specimens only), is one component of a comprehensive MRSA colonization surveillance program. It is not intended to diagnose MRSA infection nor to guide or monitor treatment for MRSA infections. RESULT CALLED TO, READ BACK BY AND VERIFIED WITH: J.SCOTTON,RN AT 0517 ON 10/30/14 BY W.SHEA   MRSA PCR Screening     Status: Abnormal   Collection Time: 10/30/14  4:50 PM  Result Value Ref Range Status   MRSA by PCR POSITIVE (A) NEGATIVE Final    Comment:        The GeneXpert MRSA Assay (FDA approved for NASAL specimens only), is one component of a comprehensive MRSA colonization surveillance program. It is not intended to diagnose MRSA infection nor to guide or monitor treatment for MRSA infections. RESULT CALLED TO, READ BACK BY AND VERIFIED WITH: I.HABIB AT 1923 ON 10/30/14 BY S.VANHOORNE      Studies: Dg Thoracic Spine 2 View  10/22/2014   CLINICAL DATA:  Status post fall, with upper back pain. Initial encounter.  EXAM: THORACIC SPINE 2 VIEWS  COMPARISON:  Chest radiograph performed 04/23/2014  FINDINGS: There is no evidence of acute  fracture or subluxation. Chronic compression deformities are noted at vertebral bodies T6, T9, T10 and T11, with changes of vertebroplasty at T10. Degenerative change is noted along the upper lumbar spine.  The visualized portions of both lungs are grossly clear. The mediastinum is unremarkable in appearance.  IMPRESSION: 1. No evidence of acute fracture or subluxation along the thoracic spine. 2. Chronic compression deformities at T6, T9, T10 and T11, with changes of vertebroplasty at T10.   Electronically Signed   By: Garald Balding M.D.   On: 11/15/2014 22:29   Dg Chest Port 1 View  10/31/2014   CLINICAL DATA:  Sepsis.  Right chest pain.  EXAM: PORTABLE CHEST - 1 VIEW  COMPARISON:  Single view of the chest 10/30/2014. PA and lateral chest 04/23/2014.  FINDINGS: Right pleural effusion and airspace disease throughout the right chest have worsened since the most recent examination. Mild atelectasis is seen in the left lung base. Heart size is enlarged. Postoperative change of vertebral augmentation is noted.  IMPRESSION: Increased right pleural effusion and airspace disease worrisome for pneumonia.   Electronically Signed   By: Inge Rise M.D.   On: 10/31/2014 07:24   Dg Chest Port 1 View  10/30/2014   CLINICAL DATA:  Hypoxia, unresponsive  EXAM: PORTABLE CHEST - 1 VIEW  COMPARISON:  04/23/2014  FINDINGS: Cardiomediastinal silhouette is stable. There is hazy airspace disease right upper lobe and right perihilar. Infiltrate/ pneumonia cannot be excluded. Left lung is clear. Tortuous upper trachea. Degenerative changes bilateral shoulders.  IMPRESSION: Hazy airspace disease in right upper lobe and right perihilar region. Infiltrate/ pneumonia cannot be excluded. Degenerative changes bilateral shoulders.   Electronically Signed   By: Lahoma Crocker M.D.   On: 10/30/2014 12:36    Scheduled Meds:  Scheduled Meds: . aspirin EC  81 mg Oral Daily  . Chlorhexidine  Gluconate Cloth  6 each Topical q morning -  10a  . clonazePAM  0.25 mg Oral QHS  . diclofenac sodium  2 g Topical QID  . enoxaparin (LOVENOX) injection  30 mg Subcutaneous Q24H  . furosemide  40 mg Oral Daily  . [START ON 11/01/2014] levofloxacin (LEVAQUIN) IV  500 mg Intravenous Q48H  . levothyroxine  25 mcg Oral QAC breakfast  . metronidazole  500 mg Intravenous Q8H  . mupirocin ointment  1 application Nasal BID  . pantoprazole  40 mg Oral Daily  . polyvinyl alcohol  1 drop Both Eyes BID  . sodium chloride  3 mL Intravenous Q12H   Continuous Infusions: . norepinephrine (LEVOPHED) Adult infusion 14 mcg/min (10/31/14 0817)    Time spent on care of this patient: 76 min   [place], MD 10/31/2014, 9:03 AM  LOS: 1 day   [facility] Office  [phone] Pager - Text Page per [email or web address] If 7PM-7AM, please contact night-coverage [email or web address]

## 2014-10-31 NOTE — Progress Notes (Signed)
PT Cancellation Note  Patient Details Name: [name] MRN: [id number] DOB: [birth year]   Cancelled Treatment:     PT eval attempted this am but deferred.  RN reports BP 87/50.  Will follow.   [name] 10/31/2014, 11:24 AM

## 2014-10-31 NOTE — Progress Notes (Signed)
*  PRELIMINARY RESULTS* Echocardiogram 2D Echocardiogram has been performed.  [name] 10/31/2014, 11:01 AM

## 2014-11-01 DIAGNOSIS — J181 Lobar pneumonia, unspecified organism: Secondary | ICD-10-CM

## 2014-11-01 DIAGNOSIS — A419 Sepsis, unspecified organism: Secondary | ICD-10-CM | POA: Insufficient documentation

## 2014-11-01 DIAGNOSIS — M549 Dorsalgia, unspecified: Secondary | ICD-10-CM

## 2014-11-01 DIAGNOSIS — R6521 Severe sepsis with septic shock: Secondary | ICD-10-CM

## 2014-11-01 DIAGNOSIS — J189 Pneumonia, unspecified organism: Secondary | ICD-10-CM | POA: Insufficient documentation

## 2014-11-01 LAB — CBC
HEMATOCRIT: 34.4 % — AB (ref 36.0–46.0)
HEMOGLOBIN: 11 g/dL — AB (ref 12.0–15.0)
MCH: 33.6 pg (ref 26.0–34.0)
MCHC: 32 g/dL (ref 30.0–36.0)
MCV: 105.2 fL — AB (ref 78.0–100.0)
Platelets: 177 10*3/uL (ref 150–400)
RBC: 3.27 MIL/uL — ABNORMAL LOW (ref 3.87–5.11)
RDW: 15.4 % (ref 11.5–15.5)
WBC: 15 10*3/uL — ABNORMAL HIGH (ref 4.0–10.5)

## 2014-11-01 LAB — BASIC METABOLIC PANEL
ANION GAP: 6 (ref 5–15)
BUN: 37 mg/dL — AB (ref 6–20)
CHLORIDE: 99 mmol/L — AB (ref 101–111)
CO2: 28 mmol/L (ref 22–32)
Calcium: 8.7 mg/dL — ABNORMAL LOW (ref 8.9–10.3)
Creatinine, Ser: 1.22 mg/dL — ABNORMAL HIGH (ref 0.44–1.00)
GFR calc Af Amer: 42 mL/min — ABNORMAL LOW (ref >60)
GFR, EST NON AFRICAN AMERICAN: 36 mL/min — AB (ref >60)
GLUCOSE: 173 mg/dL — AB (ref 65–99)
POTASSIUM: 5 mmol/L (ref 3.5–5.1)
Sodium: 133 mmol/L — ABNORMAL LOW (ref 135–145)

## 2014-11-01 IMAGING — XA IR KYPHO VERTEBRAL THORACIC AUGMENTATION
1 series · 14 of 18 positions shown · non-contrast
Comparison: none

CLINICAL DATA: New onset of severe lower thoracic pain secondary to
compression fracture at T11.

[Series 300: ir kypho vertbral thoracic augmentation · 14 of 18 slices shown]
[im 1/18]
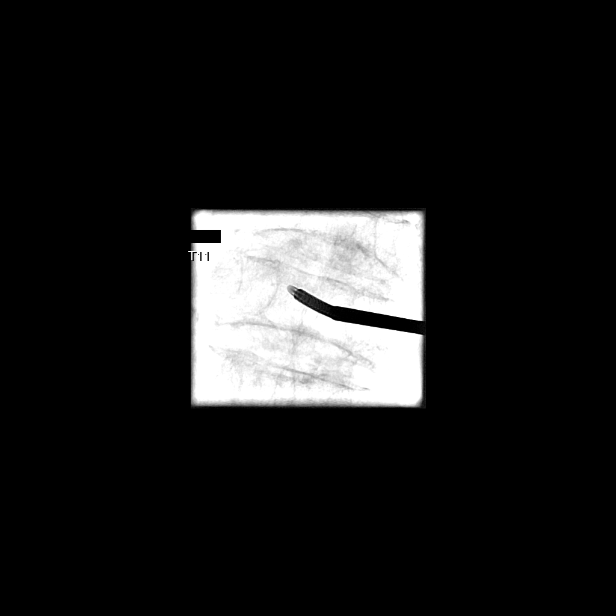
[im 2/18]
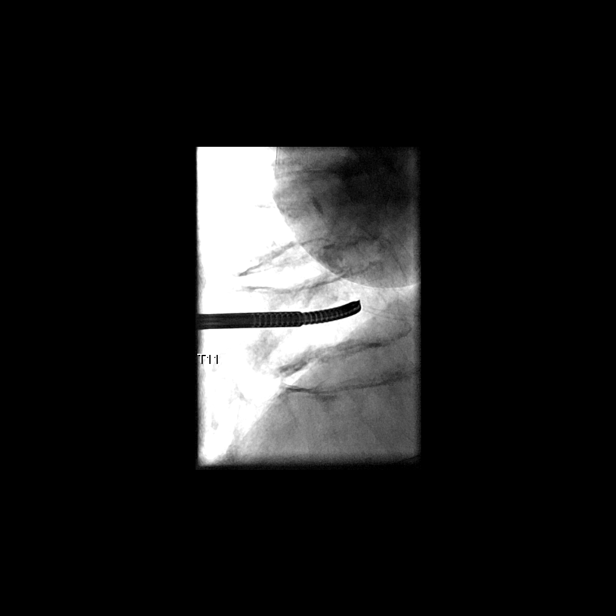
[im 4/18]
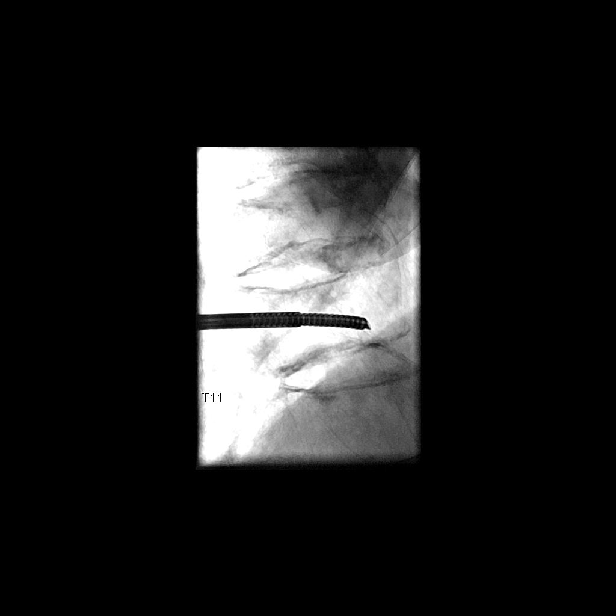
[im 5/18]
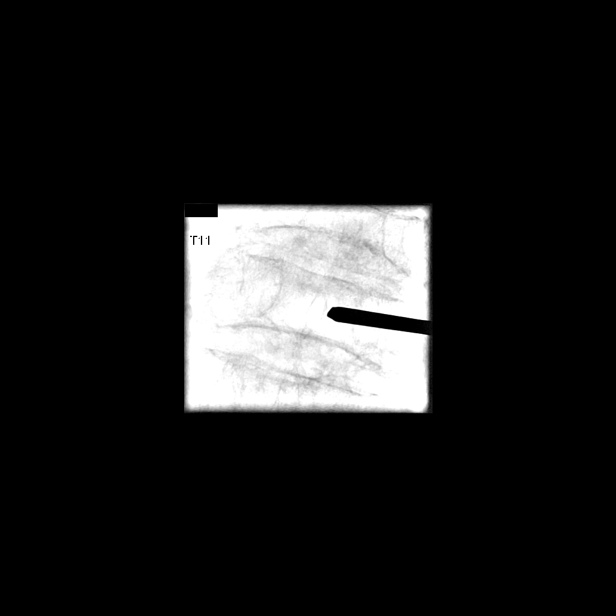
[im 6/18]
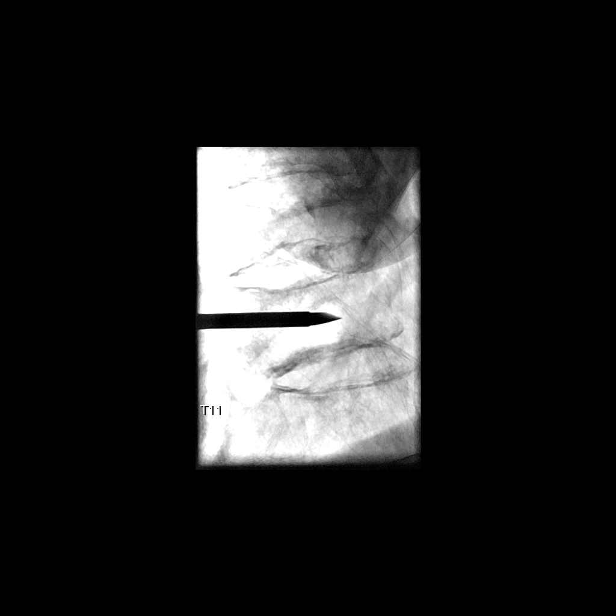
[im 8/18]
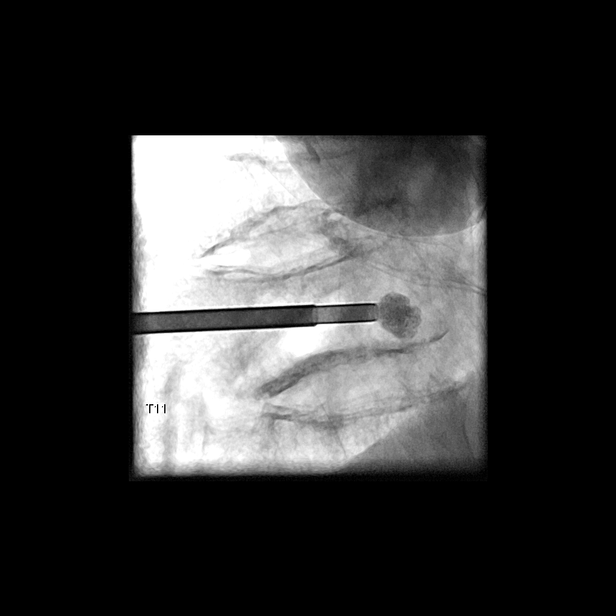
[im 9/18]
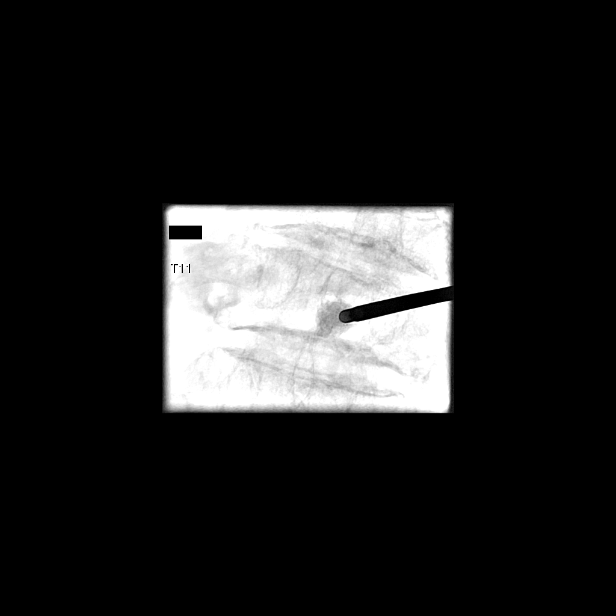
[im 10/18]
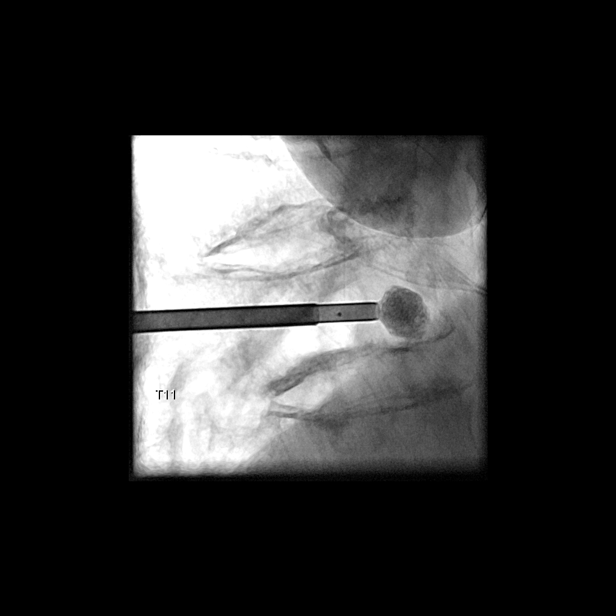
[im 11/18]
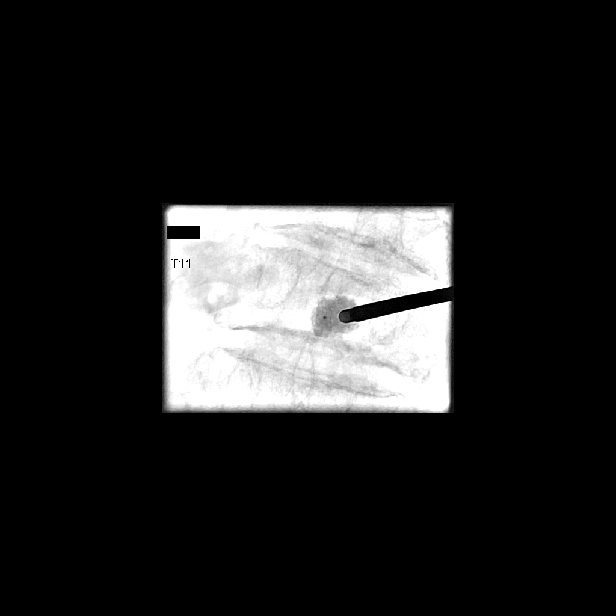
[im 13/18]
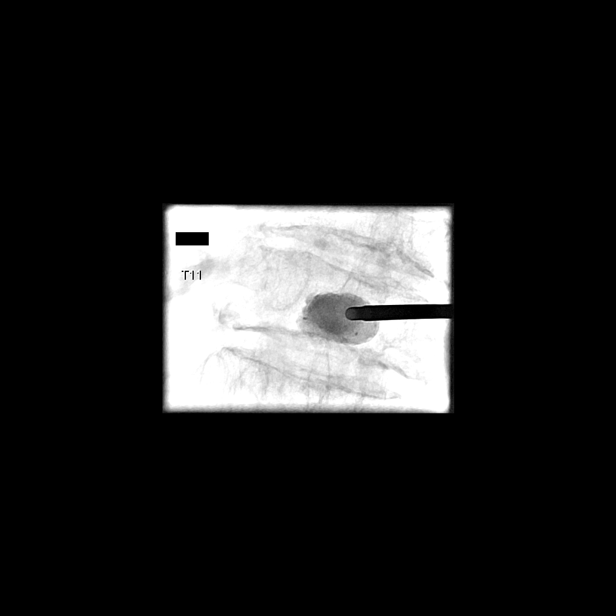
[im 14/18]
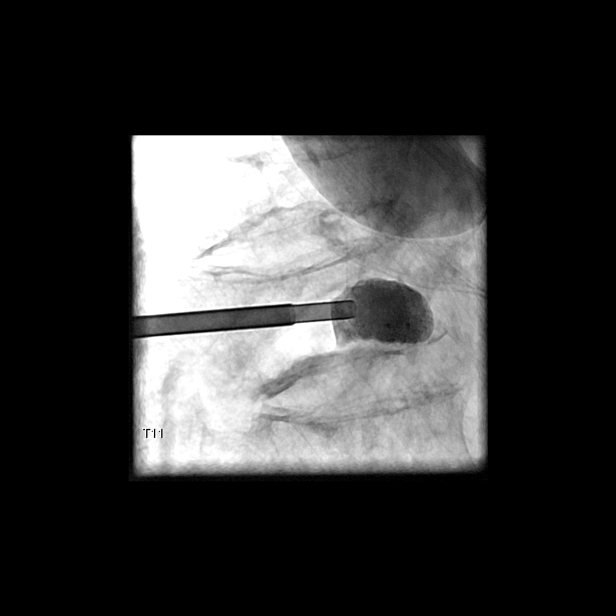
[im 15/18]
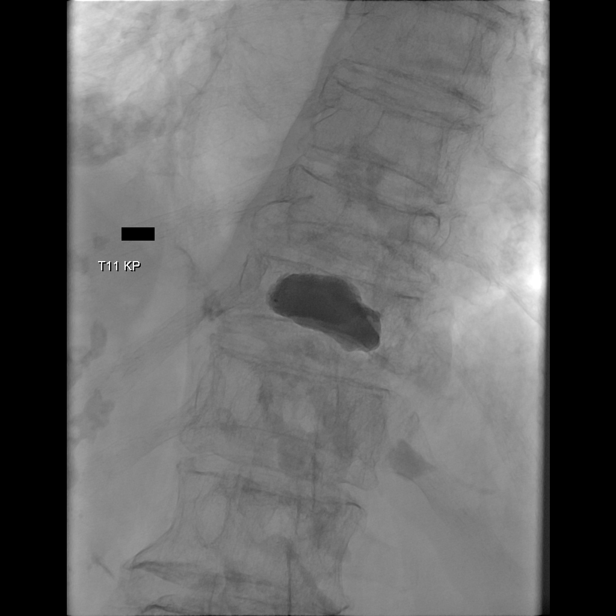
[im 17/18]
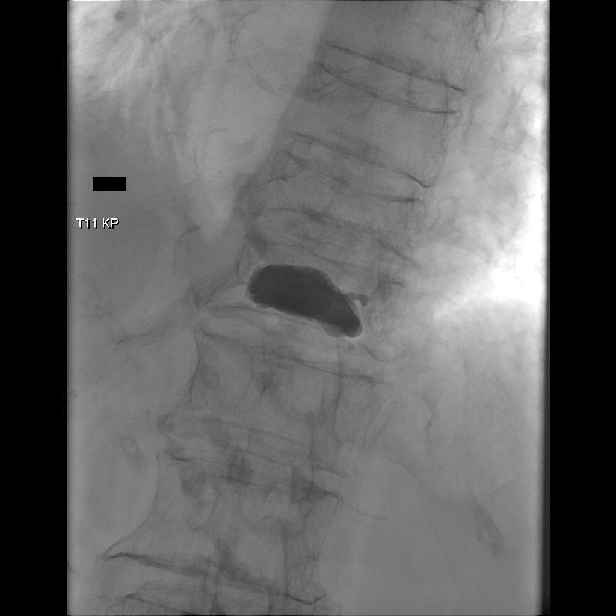
[im 18/18]
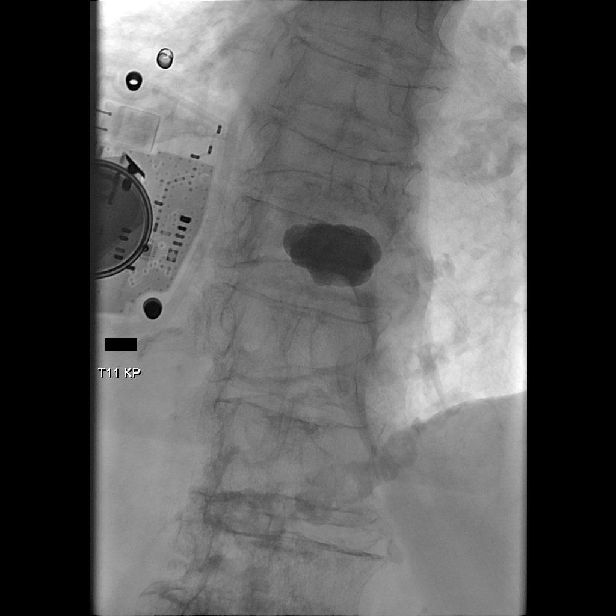

[14 of 18 positions shown; findings below may reference images not displayed]

EXAM:
KYPHOPLASTY AT T11

PROCEDURE:
Following a full explanation of the procedure along with the
potential associated complications, an informed witnessed consent
was obtained.

The patient was placed prone on the fluoroscopic table. The skin
overlying the thoracic region was then prepped and draped in the
usual sterile fashion. The right pedicle at T11 was then infiltrated
with 0.25% bupivacaine followed by the advancement of an 10.5 gauge
DFine needle through the right pedicle into the posterior one-third
at T11..

Through this, a DFine void creating device was then advanced and
manipulated in different directions under biplane intermittent
fluoroscopy to create a void.

The needle was then advanced to the junction of the anterior and the
middle one-thirds at T11. Methylmethacrylate mixture was then
reconstituted with tobramycin in the DFine mixing system. This was
then loaded on the DFine injector device. The injector device was
then locked into position at the hub of the working needle.

Using biplane intermittent fluoroscopy delivery of
methylmethacrylate mixture was then performed under intermittent
fluoroscopy.

Excellent filling was obtained in the AP and lateral projections.
There was no extension into the adjacent disc space or posteriorly
into the spine canal. No paraspinous venous contamination was seen.

The patient tolerated the procedure well. There were no acute
complications. The working needle was then removed. Hemostasis was
achieved at the skin entry site. The patient tolerated the procedure
well. There were no acute complications.

Medications utilized: Versed 2 mg IV and Fentanyl 62.5. Micrograms
IV.

Total Moderate Sedation Time:  96min. Minutes.
IMPRESSION: Status post vertebral body augmentation using DFine kyphoplasty
technique at thoracic 11 as described without event.

## 2014-11-01 MED ORDER — SODIUM CHLORIDE 0.9 % IV BOLUS (SEPSIS)
250.0000 mL | Freq: Once | INTRAVENOUS | Status: AC
  Administered 2014-11-01: 250 mL via INTRAVENOUS

## 2014-11-01 MED ORDER — PANTOPRAZOLE SODIUM 40 MG IV SOLR
40.0000 mg | INTRAVENOUS | Status: DC
  Administered 2014-11-02: 40 mg via INTRAVENOUS
  Filled 2014-11-01: qty 40

## 2014-11-01 MED ORDER — ACETAMINOPHEN 650 MG RE SUPP
650.0000 mg | RECTAL | Status: DC | PRN
  Administered 2014-11-01 (×2): 650 mg via RECTAL
  Filled 2014-11-01 (×3): qty 1

## 2014-11-01 MED ORDER — NALOXONE HCL 0.4 MG/ML IJ SOLN
INTRAMUSCULAR | Status: AC
  Administered 2014-11-01: 09:00:00
  Filled 2014-11-01: qty 1

## 2014-11-01 MED ORDER — SODIUM CHLORIDE 0.9 % IV SOLN
INTRAVENOUS | Status: DC
  Administered 2014-11-01: 16:00:00 via INTRAVENOUS
  Administered 2014-11-02: 1000 mL via INTRAVENOUS

## 2014-11-01 NOTE — Care Management Important Message (Signed)
Important Message  Patient Details  Name: [name] MRN: [id number] Date of Birth: 08-18-1915   Medicare Important Message Given:  Yes-second notification given    Camillo Flaming 11/01/2014, 12:14 [place] Message  Patient Details  Name: [name] MRN: [id number] Date of Birth: 1915-08-20   Medicare Important Message Given:  Yes-second notification given    Camillo Flaming 11/01/2014, 12:14 PM

## 2014-11-01 NOTE — Evaluation (Signed)
Physical Therapy Evaluation Patient Details Name: [name] MRN: [id number] DOB: [birth year] Today's Date: 11/01/2014   History of Present Illness  [name] is a 81 y.o. female brought to the emergency department due to right shoulder and right chest wall pain after having a fall. PMH:  LBBB, CAD, hip fx x 3, femur fx  Clinical Impression  Pt admitted with above diagnosis. Pt currently with functional limitations due to the deficits listed below (see PT Problem List).  Pt will benefit from skilled PT to increase their independence and safety with mobility to allow discharge to the venue listed below.   Pt is very lethargic this am and quite limited, her baseline functional status is unclear; Pt reports she only wants "to sleep"; It appears her rehab potential is limited at this time but will continue to follow, question if Palliative Care Consult may be helpful(?).      Follow Up Recommendations SNF    Equipment Recommendations       Recommendations for Other Services       Precautions / Restrictions Precautions Precautions: Fall Restrictions Weight Bearing Restrictions: No      Mobility  Bed Mobility               General bed mobility comments: NT--pt up in chair with total assist per nursing  Transfers                 General transfer comment: pt up to chair per nsg; too weak  and lethargic to attempt standing; total assist of 2 to wt shift for maxi sky pad placement for return to bed  Ambulation/Gait             General Gait Details: NT  Stairs            Wheelchair Mobility    Modified Rankin (Stroke Patients Only)       Balance Overall balance assessment: History of Falls                                           Pertinent Vitals/Pain Pain Assessment: No/denies pain    Home Living Family/patient expects to be discharged to:: Skilled nursing facility                 Additional Comments:  resides at [facility]    Prior Function           Comments: pt can't give info; only repeats she wants "to sleep"; per RN (per son report) pt uses a w/c at [facility] but unsure of her transfer ability     Hand Dominance        Extremity/Trunk Assessment   Upper Extremity Assessment: Generalized weakness           Lower Extremity Assessment: Generalized weakness         Communication   Communication: HOH  Cognition Arousal/Alertness: Lethargic Behavior During Therapy: WFL for tasks assessed/performed;Flat affect Overall Cognitive Status: No family/caregiver present to determine baseline cognitive functioning                      General Comments General comments (skin integrity, edema, etc.): all extremities edematous    Exercises        Assessment/Plan    PT Assessment Patient needs continued PT services  PT Diagnosis Generalized weakness   PT Problem List Decreased  strength;Decreased activity tolerance;Decreased balance;Decreased mobility  PT Treatment Interventions Functional mobility training;Therapeutic activities;Therapeutic exercise   PT Goals (Current goals can be found in the Care Plan section) Acute Rehab PT Goals Patient Stated Goal: to sleep PT Goal Formulation: With patient Time For Goal Achievement: 11/15/14 Potential to Achieve Goals: Poor    Frequency Min 2X/week   Barriers to discharge        Co-evaluation               End of Session Equipment Utilized During Treatment: Gait belt Activity Tolerance: Patient limited by fatigue;Patient limited by lethargy Patient left: in chair;with call bell/phone within reach           Time: 0950-1012 PT Time Calculation (min) (ACUTE ONLY): 22 min   Charges:   PT Evaluation $Initial PT Evaluation Tier I: 1 Procedure     PT G CodesKenyon Ana 2014-11-11, 12:25 PM

## 2014-11-01 NOTE — Plan of Care (Signed)
Problem: Phase I Progression Outcomes Goal: OOB as tolerated unless otherwise ordered Outcome: Completed/Met Date Met:  11/01/14 Required 3 people to get pt to cardiac chair.

## 2014-11-01 NOTE — Progress Notes (Signed)
More lethargic. Pulse ox 98% on 5 L now. NPO for now. Bipap PRN. Hold PO meds.   Debbe Odea ,MD

## 2014-11-01 NOTE — Progress Notes (Signed)
Dr. Wynelle Cleveland here for bedside consult at RN request.Lethargic, weak cough effort, poor gag. Rhonchi heard over lung fields and dyspnea noted; tracheal tug, accessory muscles. OOB in chair, hoyered back to bed, vomited small amt of green bile. Has voided incontinent for large amt of yellow urine. Can not say her name, and will not grip or follow commands. Humdified O2 at 5L with Sats 95. Will continue to closely observe and add Bipap when indicated.

## 2014-11-01 NOTE — Progress Notes (Signed)
CSW met with pt's son at bedside. Pt was sleeping soundly during visit. Pt's son would like pt to go to [facility] for rehab following hospital d/c. SNF would like to accept pt if they have an opening and if pt is appropriate for rehab. This has been explained to pt's son. CSW will continue to follow to assist with d/c planning.  Werner Lean LCSW [phone]

## 2014-11-01 NOTE — Progress Notes (Signed)
RT Note: RT came to assess patient. An order was entered for Bipap prn. Patient is currently on 5 lpm nasal cannula and is tolerating it with no issues. She does not appear to be in any acute respiratory distress currently, requiring her to be put on the Bipap. Her breath sounds are diminished with rhonchi and her respirations are 14 breaths per minute. Bipap is on unit on stand by for the patient should she require it. RN notified of RT's assessment and RT will continue to monitor.

## 2014-11-01 NOTE — Progress Notes (Signed)
RT placed patient on BIPAP 12/5, rate 12, and O2 50%. Patient was sating in th 70's while sleeping. Patient is now resting comfortably. RT will continue to monitor.

## 2014-11-01 NOTE — Progress Notes (Signed)
RT Note: RT had been paging Dr. Wynelle Cleveland after assessing the patient and I just got a call back. RT paged her to clarify Bipap order and to find out when she wants her to be placed on Bipap. Md is aware that patient is still lethargic as she was when she saw her but she only wants her to be put on Bipap if she develops respiratory distress not for the lethargia. Patient did not have any distress when we went to assess her but she will be started on Bipap if she does become distressed. Rt will continue to monitor.

## 2014-11-02 ENCOUNTER — Inpatient Hospital Stay (HOSPITAL_COMMUNITY): Payer: Medicare Other

## 2014-11-02 DIAGNOSIS — I5031 Acute diastolic (congestive) heart failure: Secondary | ICD-10-CM

## 2014-11-02 MED ORDER — ATROPINE SULFATE 1 % OP SOLN
2.0000 [drp] | Freq: Four times a day (QID) | OPHTHALMIC | Status: DC | PRN
  Filled 2014-11-02: qty 2

## 2014-11-02 MED ORDER — MORPHINE SULFATE (PF) 2 MG/ML IV SOLN
2.0000 mg | INTRAVENOUS | Status: DC | PRN
  Administered 2014-11-02 (×2): 4 mg via INTRAVENOUS
  Filled 2014-11-02 (×2): qty 2

## 2014-11-05 LAB — CULTURE, BLOOD (ROUTINE X 2)
CULTURE: NO GROWTH
CULTURE: NO GROWTH

## 2014-11-17 NOTE — Progress Notes (Signed)
Initial Nutrition Assessment  DOCUMENTATION CODES:   Severe malnutrition in context of chronic illness  INTERVENTION:   Diet advancement per MD When diet advanced, provide Ensure Enlive po BID, each supplement provides 350 kcal and 20 grams of protein RD to continue to monitor  NUTRITION DIAGNOSIS:   Malnutrition related to   as evidenced by severe depletion of body fat, severe depletion of muscle mass.  GOAL:   Patient will meet greater than or equal to 90% of their needs  MONITOR:   PO intake, Supplement acceptance, Diet advancement, Labs, Weight trends, Skin, I & O's  REASON FOR ASSESSMENT:   Consult Assessment of nutrition requirement/status  ASSESSMENT:   81 y.o. female with below past medical history who is brought to the emergency department due to right shoulder and right chest wall pain after having a fall at a local nursing home while she was sitting on her wheelchair.  Pt in room with no family at bedside. Pt appears lethargic. Pt with noticeable fluid accumulation. Per weight history, pt with ~15 lb of weight gain over UBW of 140 lb.  Pt currently NPO, pt was eating 0-30% of heart healthy diet prior to NPO status. Pt to stay NPO until she is awake and can follow commands.  Nutrition-Focused physical exam completed. Findings are severe fat depletion, severe muscle depletion, and moderate-severe edema.   Labs reviewed: Low Na Elevated BUN & Creatinine Glucose: 173  Diet Order:  Diet NPO time specified  Skin:  Reviewed, no issues  Last BM:  8/13  Height:   Ht Readings from Last 1 Encounters:  10/30/14 4\' 8"  (1.422 m)    Weight:   Wt Readings from Last 1 Encounters:  11/12/2014 156 lb 1.4 oz (70.8 kg)    Ideal Body Weight:  42.5 kg  BMI:  Body mass index is 35.01 kg/(m^2).  Estimated Nutritional Needs:   Kcal:  1100-1300  Protein:  65-75g  Fluid:  1.3L/day  EDUCATION NEEDS:   No education needs identified at this time  Clayton Bibles,  MS, RD, LDN Pager: [phone] After Hours Pager: [phone]

## 2014-11-17 NOTE — Progress Notes (Signed)
CSW has met with pt's son to offer emotional support.   Werner Lean LCSW [phone]

## 2014-11-17 NOTE — Consult Note (Signed)
PULMONARY / CRITICAL CARE MEDICINE   Name: [name] MRN: [id number] DOB: 24-Dec-1915    ADMISSION DATE:  10/25/2014 CONSULTATION DATE:  11/20/2014  REFERRING MD :  Dr. Doyle Askew   CHIEF COMPLAINT:  Hypotension  INITIAL PRESENTATION: 81 y/o F admitted 8/13 for R shoulder and chest pain after a fall at a SNF.  Am of 8/14, she was found unresponsive with hypotension and RRT was called.  She had ongoing hypotension despite fluid/narcan.  TRH treated as PNA and septic shock.  DNR.  PCCM consulted 8/17 to evaluate for further interventions.   STUDIES:  8/13 Thoracic Spine >> no evidence of acute fracture, chronic compression fractures (multiple)  SIGNIFICANT EVENTS: 8/13  Admit s/p fall, R shoulder pain, chest pain  8/14  Noted to have R infiltrate, hypotension   HISTORY OF PRESENT ILLNESS:  81 y/o F with a PMH of CAD, GERD, Arthritis, Anemia, LBBB, L hip fx s/p ORIF (2014) admitted 8/13 for R shoulder and chest pain after a fall at a SNF.  The patient was admitted per [facility] and treated with morphine for shoulder and chest pain.  Thoracic spine films were negative for acute fracture but showed multiple chronic compression fractures.   Am of 8/14, she was found unresponsive with hypotension and RRT was called, pt was treated with Narcan with improvement in mental status.  The patient was treated with IVF with improvement in BP to the 30'Q systolic.  She had ongoing hypotension that was identified by PT.  She was transferred to SDU for further evaluation by TRH.  Lactic acid was normal at that time.  She was also found to have a possible RUL infiltrate with rising WBC and was started on levaquin / flagyl.  The am of 8/15, she was started on levophed for hypotension per TRH without improvement in BP.  The patient remained on levophed and intermittent BiPAP for approximately 48 hours without improvement.   At baseline, she lives in an ALF and is active per her Son.  She reads, plays cards and games  with friends.  She initially reported she fell out of a chair while playing a game.  However, her Son thinks she was trying to get up and fell striking the chair on the way down.  She had also told him approximately 3 days prior to admission that she had been feeling poorly (non-specific).    PCCM consulted for evaluation of shock.      PAST MEDICAL HISTORY :   has a past medical history of Arthritis; Coronary artery disease; GERD (gastroesophageal reflux disease); Anemia; LBBB (left bundle branch block); and Varicose vein of leg.  has past surgical history that includes Hip fracture surgery; ORIF femur fracture (Left, 12/13/2012); Appendectomy; Cholecystectomy; and Kyphoplasty.    Prior to Admission medications   Medication Sig Start Date End Date Taking? Authorizing Provider  aspirin EC 81 MG EC tablet Take 1 tablet (81 mg total) by mouth daily. 10/04/13  Yes Janece Canterbury, MD  cholestyramine Lucrezia Starch) 4 G packet Take 4 g by mouth daily as needed (for loose stools).   Yes Historical Provider, MD  clonazePAM (KLONOPIN) 0.5 MG tablet Take 1 tablet (0.5 mg total) by mouth at bedtime. Patient taking differently: Take 0.25 mg by mouth at bedtime.  10/04/13  Yes Tiffany L Reed, DO  Eyelid Cleansers (OCUSOFT LID SCRUB) PADS Place 1 each into both eyes 2 (two) times daily.   Yes Historical Provider, MD  furosemide (LASIX) 40 MG tablet Take  40 mg by mouth daily.   Yes Historical Provider, MD  levothyroxine (SYNTHROID, LEVOTHROID) 25 MCG tablet Take 25 mcg by mouth daily before breakfast.   Yes Historical Provider, MD  lisinopril (PRINIVIL,ZESTRIL) 2.5 MG tablet Take 2.5 mg by mouth daily.   Yes Historical Provider, MD  Multiple Vitamins-Minerals (THEREMS M PO) Take 1 tablet by mouth daily with breakfast.   Yes Historical Provider, MD  oxyCODONE-acetaminophen (PERCOCET/ROXICET) 5-325 MG per tablet Take one to two tablets by mouth every 4 hours as needed for moderate to severe pain. Administer prior  before painful procedures 10/04/13  Yes Tiffany L Reed, DO  polyvinyl alcohol (LIQUIFILM TEARS) 1.4 % ophthalmic solution Place 1 drop into both eyes 2 (two) times daily.   Yes Historical Provider, MD  potassium chloride SA (K-DUR,KLOR-CON) 20 MEQ tablet Take 20 mEq by mouth daily.   Yes Historical Provider, MD  Probiotic Product (PROBIOTIC DAILY) CAPS Take by mouth daily.   Yes Historical Provider, MD  acetaminophen (TYLENOL) 325 MG tablet Take 2 tablets (650 mg total) by mouth every 6 (six) hours as needed for mild pain (or Fever >/= 101). Patient not taking: Reported on 10/28/2014 10/04/13   Janece Canterbury, MD  ciprofloxacin (CIPRO) 500 MG tablet Take 1 tablet (500 mg total) by mouth 2 (two) times daily. One po bid x 7 days Patient not taking: Reported on 05/20/2014 04/23/14   Ernestina Patches, MD  clindamycin (CLEOCIN) 300 MG capsule Take 600 mg by mouth See admin instructions. Takes 600mg  as needed 1-hour prior to any scheduled dental appointment    Historical Provider, MD  docusate sodium 100 MG CAPS Take 100 mg by mouth 2 (two) times daily. Patient taking differently: Take 100 mg by mouth daily as needed. For stool softner 10/04/13   Janece Canterbury, MD  enoxaparin (LOVENOX) 30 MG/0.3ML injection Inject 0.3 mLs (30 mg total) into the skin daily. Lovenox injection daily for one more week and then discontinue. Patient not taking: Reported on 04/23/2014 10/04/13   Arlee Muslim, PA-C  feeding supplement, ENSURE COMPLETE, (ENSURE COMPLETE) LIQD Take 237 mLs by mouth 3 (three) times daily between meals. Patient not taking: Reported on 04/23/2014 05/29/13   Delfina Redwood, MD  Menthol, Topical Analgesic, 4 % GEL Apply topically 2 (two) times daily.    Historical Provider, MD  omeprazole (PRILOSEC) 40 MG capsule Take 1 capsule (40 mg total) by mouth every morning. Patient not taking: Reported on 11/16/2014 05/29/13   Delfina Redwood, MD  ondansetron [facility]) 4 MG tablet Take 4 mg by mouth 2 (two) times daily  as needed for nausea or vomiting. 10/01/13   Historical Provider, MD  polyethylene glycol (MIRALAX / GLYCOLAX) packet Take 17 g by mouth daily as needed for mild constipation.    Historical Provider, MD   Allergies  Allergen Reactions  . Penicillins Other (See Comments)    Childhood allergy; reaction unknown    FAMILY HISTORY:  has no family status information on file.    SOCIAL HISTORY:  reports that she has never smoked. She has never used smokeless tobacco. She reports that she does not drink alcohol or use illicit drugs.  REVIEW OF SYSTEMS:  Unable to complete as patient is altered.  Son at bedside provides history information.    SUBJECTIVE:   VITAL SIGNS: Temp:  [97.1 F (36.2 C)-98.2 F (36.8 C)] 97.5 F (36.4 C) (08/17 0800) Pulse Rate:  [74] 74 (08/16 1645) Resp:  [13-37] 24 (08/17 1000) BP: (92-142)/(21-52) 105/52 mmHg (  08/17 1000) SpO2:  [88 %-100 %] 99 % (08/17 1000) FiO2 (%):  [50 %] 50 % (08/17 0400) Weight:  [156 lb 1.4 oz (70.8 kg)] 156 lb 1.4 oz (70.8 kg) (08/17 0400)   VENTILATOR SETTINGS: Vent Mode:  [-]  FiO2 (%):  [50 %] 50 %   INTAKE / OUTPUT:  Intake/Output Summary (Last 24 hours) at 11-16-14 1105 Last data filed at November 16, 2014 1022  Gross per 24 hour  Intake   3397 ml  Output    826 ml  Net   2571 ml    PHYSICAL EXAMINATION: General:  Frail elderly female, appears to be actively dying  Neuro:  Altered, occasionally moves spontaneously but not awake/alert HEENT:  MM pink/dry, no jvd  Cardiovascular:  s1s2 rrr Lungs:  Agonal respirations, mild accessory muscle usage  Abdomen:  Soft, BSx4 hypoactive  Musculoskeletal:  No acute deformities  Skin:  Warm/dry, pink, no rashes or lesions  LABS:  CBC  Recent Labs Lab 10/30/14 1512 10/31/14 0359 11/01/14 0430  WBC 14.1* 10.3 15.0*  HGB 11.0* 12.0 11.0*  HCT 36.0 38.3 34.4*  PLT 155 181 177   BMET  Recent Labs Lab 10/30/14 1512 10/31/14 0359 11/01/14 0430  NA 141 139 133*  K 5.5*  5.1 5.0  CL 106 106 99*  CO2 28 24 28   BUN 43* 42* 37*  CREATININE 1.42* 1.35* 1.22*  GLUCOSE 138* 132* 173*     ASSESSMENT / PLAN:  Status Post Mechanical Fall  R Pneumonia & Effusion  Shock - suspect related to PNA / natural progression of dying process  Compression Fractures of Thoracic Spine   81 y/o F with recent mechanical fall at SNF.  She was initially admitted for further evaluation and pain control.  Work up was notable for chronic thoracic compression fractures but no acute fracture.  She was treated with morphine for pain.  Unfortunately, she developed hypotension and AMS.  CXR was notable for significant right sided infiltrate and effusion.  Family made her a DNR but were accepting of vasopressors and BiPAP.  She continued on therapies for approximately 48 hours without improvement.  PCCM consulted for review of shock.  Exam reflects a patient that is actively dying.  Extensive discussion with Son at bedside who wishes to progress toward comfort measures.    Plan: Morphine PRN for comfort Discontinue all orders that do not contributed to comfort  PRN atropine gtt SL for increased secretions  Updated primary attending on discussion / plan of care.   Full DNR, comfort measures only   PCCM will be available PRN.  Please call if we can be of further assistance.     Noe Gens, NP-C [facility] Pgr: [phone] or if no answer [phone] 16-Nov-2014, 11:05 AM

## 2014-11-17 NOTE — Progress Notes (Signed)
ANTIBIOTIC CONSULT NOTE - INITIAL  Pharmacy Consult for Levaquin Indication: aspiration PNA  Allergies  Allergen Reactions  . Penicillins Other (See Comments)    Childhood allergy; reaction unknown    Patient Measurements: Height: 4\' 8"  (142.2 cm) Weight: 156 lb 1.4 oz (70.8 kg) IBW/kg (Calculated) : 36.3  Vital Signs: Temp: 97.5 F (36.4 C) (08/17 0800) Temp Source: Axillary (08/17 0800) BP: 105/52 mmHg (08/17 1000) Intake/Output from previous day: 08/16 0701 - 08/17 0700 In: 3087 [P.O.:590; I.V.:1777; IV Piggyback:400] Out: 726 [Urine:726] Intake/Output from this shift: Total I/O In: 750 [I.V.:650; IV Piggyback:100] Out: 100 [Urine:100]  Labs:  Recent Labs  10/30/14 1512 10/31/14 0359 11/01/14 0430  WBC 14.1* 10.3 15.0*  HGB 11.0* 12.0 11.0*  PLT 155 181 177  CREATININE 1.42* 1.35* 1.22*   Estimated Creatinine Clearance: 21.3 mL/min (by C-G formula based on Cr of 1.22). No results for input(s): VANCOTROUGH, VANCOPEAK, VANCORANDOM, GENTTROUGH, GENTPEAK, GENTRANDOM, TOBRATROUGH, TOBRAPEAK, TOBRARND, AMIKACINPEAK, AMIKACINTROU, AMIKACIN in the last 72 hours.   Microbiology: Recent Results (from the past 720 hour(s))  MRSA PCR Screening     Status: Abnormal   Collection Time: 10/30/14  2:48 AM  Result Value Ref Range Status   MRSA by PCR POSITIVE (A) NEGATIVE Final    Comment:        The GeneXpert MRSA Assay (FDA approved for NASAL specimens only), is one component of a comprehensive MRSA colonization surveillance program. It is not intended to diagnose MRSA infection nor to guide or monitor treatment for MRSA infections. RESULT CALLED TO, READ BACK BY AND VERIFIED WITH: J.SCOTTON,RN AT 0517 ON 10/30/14 BY W.SHEA   MRSA PCR Screening     Status: Abnormal   Collection Time: 10/30/14  4:50 PM  Result Value Ref Range Status   MRSA by PCR POSITIVE (A) NEGATIVE Final    Comment:        The GeneXpert MRSA Assay (FDA approved for NASAL specimens only), is  one component of a comprehensive MRSA colonization surveillance program. It is not intended to diagnose MRSA infection nor to guide or monitor treatment for MRSA infections. RESULT CALLED TO, READ BACK BY AND VERIFIED WITH: I.HABIB AT 1923 ON 10/30/14 BY S.VANHOORNE   Culture, blood (x 2)     Status: None (Preliminary result)   Collection Time: 10/30/14  6:20 PM  Result Value Ref Range Status   Specimen Description BLOOD LEFT HAND  Final   Special Requests BOTTLES DRAWN AEROBIC ONLY [place]  Final   Culture   Final    NO GROWTH 1 DAY Performed at [facility]    Report Status PENDING  Incomplete  Culture, blood (x 2)     Status: None (Preliminary result)   Collection Time: 10/30/14  6:24 PM  Result Value Ref Range Status   Specimen Description BLOOD RIGHT HAND  Final   Special Requests BOTTLES DRAWN AEROBIC AND ANAEROBIC 5CC  Final   Culture   Final    NO GROWTH 1 DAY Performed at [facility]    Report Status PENDING  Incomplete    Medical History: Past Medical History  Diagnosis Date  . Arthritis   . Coronary artery disease   . GERD (gastroesophageal reflux disease)   . Anemia   . LBBB (left bundle branch block)   . Varicose vein of leg     Assessment: 89 yoF brought to ED from NH after fall from wheelchair complaining of right shoulder and right chest all pain.  After admission,  pt found unresponsive with hypotension which improved with NS and IV narcan.  PMHx includes hypothyroidism, CAD, CHF, LBBB, and GERD.  Chest x-ray cannot exclude infiltrate/PNA and pharmacy consulted to start levaquin for CAP. MD dosing flagyl.   Today, 11-07-2014: - afebrile - wbc up 15 - scr trending down (crcl~21)  8/14 >> levaquin  >> 8/14 >> flagyl  >>    8/14 bcx x2: ngtd 8/14 MRSA PCR (+) on chlx and bactroban   Goal of Therapy:  Eradication of infection  Plan:  - cont Levaquin 500mg  IV q48h  - f/u renal function and cultures  Dia Sitter, PharmD, BCPS November 07, 2014  11:18 AM

## 2014-11-17 NOTE — Progress Notes (Signed)
[id number]/YB review for continued stay completed/Kada Friesen,RN,BSN,CCM

## 2014-11-17 NOTE — Progress Notes (Signed)
Patient ID: [name], female   DOB: 12-31-1915, 81 y.o.   MRN: [id number]  [facility] PROGRESS NOTE  [name] YPP:[id number] DOB: Sep 19, 1915 DOA: 10/30/2014 PCP: Reymundo Poll, MD   Brief narrative:    81 y.o. Female, resident of ALF and independent in most of her ADL's, presented with right shoulder and right chest wall pain after an episode of fall while sitting in the chair (she fell backwards). Hospital course complicated by hypotension and hypoxia, determined to be secondary to septic shock/PNA, requiring pressors and ABX respectively. Has been on BiPAP since 8/15 due to worsening hypoxia.   Assessment/Plan:   Acute hypoxic respiratory failure  - multifactorial and secondary to RUL PNA and pulmonary vascular congestion - still on BiPAP this AM - will stop IVF as pt is 15 lbs over her baseline weight and volume overloaded  - pt has been started on Levaquin and Flagyl 8/14, today is day # 4, will continue same regimen - repeat CXR this AM Septic shock secondary to ? Aspiration/lobar PNA, RUL - abx as noted above - still on pressors, plan to taper to off as possible - PCCm consulted for further assistance while pt on pressors  Acute diastolic CHF - 15 lbs over baseline weights - stop IVF (has been on NS 75 cc/hr) - repeat CXR pending this AM - may need resumption of lasix if BP stable off pressors  Acute encephalopathy - unresponsive AM 8/14 - presumed to be narcotic induced, pt given Narcan, more alert this AM but still drowsy - has been NPO while on BiPAP  Acute renal failure - appears to be pre renal in etiology - IVF have been provided and Cr is trending down  - will stop IVF today as weight continues trending up Intractable acute on chronic back pain - admitted for r/o ACS- troponin neg - pt with multiple underlying thoracic spinal compression deformities  - pain is musculoskeletal, likely from fall - better controlled this am  Urinary retention -  bladder scan  Esophageal reflux - cont PPI Hypothyroidism - cont synthroid Severe PCM - in the context of acute illness - nutritionist consulted   DVT prophylaxis - Lovenox SQ  Code Status: partial code, no intubation but OK to use pressors  Family Communication:  plan of care discussed with son [name] over the phone  Disposition Plan: Keep in ICU as pt still on pressors   IV access:  Peripheral IV  Procedures and diagnostic studies:    Dg Thoracic Spine 2 View 11/04/2014 No evidence of acute fracture or subluxation along the thoracic spine. 2. Chronic compression deformities at T6, T9, T10 and T11, with changes of vertebroplasty at T10.     Dg Chest Port 1 View 10/31/2014  Increased right pleural effusion and airspace disease worrisome for pneumonia.   Dg Chest Port 1 View 10/30/2014 Hazy airspace disease in right upper lobe and right perihilar region. Infiltrate/ pneumonia cannot be excluded. Degenerative changes bilateral shoulders.   Medical Consultants:  PCCM  Other Consultants:  Nutritionist   IAnti-Infectives:   Flagyl and Levaquin 8/14 -->  Faye Ramsay, MD  [facility] Pager [phone]  If 7PM-7AM, please contact night-coverage [email or web address] Password [email or web address] 11-07-2014, 9:05 AM   LOS: 3 days   HPI/Subjective: No events overnight.   Objective: Filed Vitals:   2014/11/07 0400 Nov 07, 2014 0500 07-Nov-2014 0600 11-07-2014 0700  BP: 102/26 110/26 110/38 134/43  Pulse:      Temp: 97.8 F (36.6 C)  TempSrc: Axillary     Resp: 17 19 14 26   Height:      Weight: 70.8 kg (156 lb 1.4 oz)     SpO2: 95% 95% 95% 100%    Intake/Output Summary (Last 24 hours) at 2014-11-08 0905 Last data filed at 11-08-2014 0608  Gross per 24 hour  Intake   2747 ml  Output    726 ml  Net   2021 ml    Exam:   General:  Pt is on BiPAP, sleepy but easy to awake, follows some commands   Cardiovascular: Regular rate and rhythm, no rubs, no gallops  Respiratory: Bibasilar crackles,  diminished breath sounds bilaterally   Abdomen: Soft, non tender, non distended, bowel sounds present, no guarding  Extremities: Bilateral LE edema pitting + 1-2   Data Reviewed: Basic Metabolic Panel:  Recent Labs Lab 11/12/2014 2334 10/30/14 0533 10/30/14 1512 10/31/14 0359 11/01/14 0430  NA 142 140 141 139 133*  K 4.9 5.3* 5.5* 5.1 5.0  CL 102 107 106 106 99*  CO2 31 27 28 24 28   GLUCOSE 139* 116* 138* 132* 173*  BUN 38* 37* 43* 42* 37*  CREATININE 1.11* 1.13* 1.42* 1.35* 1.22*  CALCIUM 9.5 8.6* 8.4* 8.5* 8.7*   Liver Function Tests:  Recent Labs Lab 10/30/14 0533  AST 37  ALT 31  ALKPHOS 113  BILITOT 0.4  PROT 6.3*  ALBUMIN 3.0*   CBC:  Recent Labs Lab 10/24/2014 2334 10/30/14 1512 10/31/14 0359 11/01/14 0430  WBC 8.8 14.1* 10.3 15.0*  NEUTROABS 5.4  --   --   --   HGB 13.1 11.0* 12.0 11.0*  HCT 41.0 36.0 38.3 34.4*  MCV 104.1* 105.6* 107.0* 105.2*  PLT 184 155 181 177   Cardiac Enzymes:  Recent Labs Lab 10/24/2014 2334 10/30/14 0533 10/30/14 1125  TROPONINI <0.03 <0.03 <0.03    Recent Results (from the past 240 hour(s))  MRSA PCR Screening     Status: Abnormal   Collection Time: 10/30/14  2:48 AM  Result Value Ref Range Status   MRSA by PCR POSITIVE (A) NEGATIVE Final    Comment:        The GeneXpert MRSA Assay (FDA approved for NASAL specimens only), is one component of a comprehensive MRSA colonization surveillance program. It is not intended to diagnose MRSA infection nor to guide or monitor treatment for MRSA infections. RESULT CALLED TO, READ BACK BY AND VERIFIED WITH: J.SCOTTON,RN AT 0517 ON 10/30/14 BY W.SHEA   MRSA PCR Screening     Status: Abnormal   Collection Time: 10/30/14  4:50 PM  Result Value Ref Range Status   MRSA by PCR POSITIVE (A) NEGATIVE Final    Comment:        The GeneXpert MRSA Assay (FDA approved for NASAL specimens only), is one component of a comprehensive MRSA colonization surveillance program. It is  not intended to diagnose MRSA infection nor to guide or monitor treatment for MRSA infections. RESULT CALLED TO, READ BACK BY AND VERIFIED WITH: I.HABIB AT 1923 ON 10/30/14 BY S.VANHOORNE   Culture, blood (x 2)     Status: None (Preliminary result)   Collection Time: 10/30/14  6:20 PM  Result Value Ref Range Status   Specimen Description BLOOD LEFT HAND  Final   Special Requests BOTTLES DRAWN AEROBIC ONLY [place]  Final   Culture   Final    NO GROWTH 1 DAY Performed at [facility]    Report Status PENDING  Incomplete  Culture,  blood (x 2)     Status: None (Preliminary result)   Collection Time: 10/30/14  6:24 PM  Result Value Ref Range Status   Specimen Description BLOOD RIGHT HAND  Final   Special Requests BOTTLES DRAWN AEROBIC AND ANAEROBIC 5CC  Final   Culture   Final    NO GROWTH 1 DAY Performed at [facility]    Report Status PENDING  Incomplete     Scheduled Meds: . antiseptic oral rinse  7 mL Mouth Rinse BID  . aspirin EC  81 mg Oral Daily  . Chlorhexidine Gluconate Cloth  6 each Topical q morning - 10a  . diclofenac sodium  2 g Topical QID  . enoxaparin (LOVENOX) injection  30 mg Subcutaneous Q24H  . levofloxacin (LEVAQUIN) IV  500 mg Intravenous Q48H  . levothyroxine  25 mcg Oral QAC breakfast  . metronidazole  500 mg Intravenous Q8H  . mupirocin ointment  1 application Nasal BID  . pantoprazole (PROTONIX) IV  40 mg Intravenous Q24H  . polyvinyl alcohol  1 drop Both Eyes BID  . sodium chloride  3 mL Intravenous Q12H   Continuous Infusions: . sodium chloride 1,000 mL (Nov 03, 2014 0607)  . norepinephrine (LEVOPHED) Adult infusion 20 mcg/min (11/03/14 8938)

## 2014-11-17 NOTE — Progress Notes (Signed)
Pt passed away 11/07/2014 at 1539. No heart and lung sounds auscultated for one full minute with Gladys Damme, RN. Son at bedside. [facility] notified, pt not suitable for organ, tissue, or eye donation. Reference number [id number] spoke with Ronna Polio. Dr. Doyle Askew notified.

## 2014-11-17 NOTE — Progress Notes (Signed)
A referral from the nurse, chaplain visited patient and family member. Chaplain provided a listening presence for patient's son as well as support. Son does not want patient to suffer. Son praised the efforts of the staff. Chaplain will continue to follow.   Dec 02, 2014 1500  Clinical Encounter Type  Visited With Patient and family together  Visit Type Initial;Spiritual support;Social support  Referral From Nurse  Consult/Referral To Chaplain

## 2014-11-17 NOTE — Discharge Summary (Addendum)
  Death Summary  [name] AJG:[id number] DOB: [birth year] DOA: 11-28-14  PCP: Reymundo Poll, MD PCP/Office notified:  Admit date: 11/28/2014 Date of Death: December 02, 2014 at 40: 31  Final Diagnoses:  Principal Problem:   Intractable back pain Active Problems:   Septic shock   Right lower lobe pneumonia   Brief narrative:    81 y.o. Female, resident of ALF and independent in most of her ADL's, presented with right shoulder and right chest wall pain after an episode of fall while sitting in the chair (she fell backwards). Hospital course complicated by hypotension and hypoxia, determined to be secondary to septic shock/PNA, requiring pressors and ABX respectively. Has been on BiPAP since 8/15 due to worsening hypoxia.   Assessment/Plan:   Acute hypoxic respiratory failure  - multifactorial and secondary to RUL PNA and pulmonary vascular congestion, acute diastolic CHF present on admission  - respiratory status worse despite BiPAP and ABX treatment - per son request, comfort is priority and pt has been transitioned to full comfort - passed away few hours after taken off BiPAP  Septic shock secondary to ? Aspiration/lobar PNA, RUL present on admission  - abx as noted above were provided but pt has not responded to the treatment and respiratory status has been progressively worse  - PCCm consulted for further assistance and has recommended transition to comfort - son has agreed and pt made DNR, comfort ensured while taken off BiPAP  Acute diastolic CHF, present on admission  - 15 lbs over baseline weights - due to hypotension, has required IVF - IVF have been stopped but diuresis difficult in the setting of hypotension - son has agreed with comfort care as noted above  Acute encephalopathy - unresponsive AM 8/14 - presumed to be narcotic induced Acute renal failure - pre renal in etiology - IVF have been provided and Cr was trending down  - no further blood work desired in  an effort to ensure comfort  Intractable acute on chronic back pain - admitted for r/o ACS- troponin neg - pt with multiple underlying thoracic spinal compression deformities  - pain is musculoskeletal, likely from fall Urinary retention Esophageal reflux Hypothyroidism Severe PCM - in the context of acute illness  Code Status: DNR Family Communication: plan of care discussed with son Clare Gandy over the phone   IV access:  Peripheral IV  Procedures and diagnostic studies:   Dg Thoracic Spine 2 View 11-28-14 No evidence of acute fracture or subluxation along the thoracic spine. 2. Chronic compression deformities at T6, T9, T10 and T11, with changes of vertebroplasty at T10.   Dg Chest Port 1 View 10/31/2014 Increased right pleural effusion and airspace disease worrisome for pneumonia.   Dg Chest Port 1 View 10/30/2014 Hazy airspace disease in right upper lobe and right perihilar region. Infiltrate/ pneumonia cannot be excluded. Degenerative changes bilateral shoulders.   Medical Consultants:  PCCM  Other Consultants:  Nutritionist   IAnti-Infectives:   Flagyl and Levaquin 8/14 --> stopped in an effort to ensure comfort and pt passed several hours after stopping ABX and BiPAP       Signed:  Faye Ramsay  [facility] 11/03/2014, 8:34 AM [phone]

## 2014-11-17 DEATH — deceased

## 2015-02-17 IMAGING — CR DG CHEST 2V
2 series · 2 of 2 positions shown · non-contrast
Comparison: 05/27/2013

CLINICAL DATA: Mid chest pain and discomfort prior to and since a
fall this morning, history coronary artery disease

EXAM:
CHEST  2 VIEW

[w chest lat]
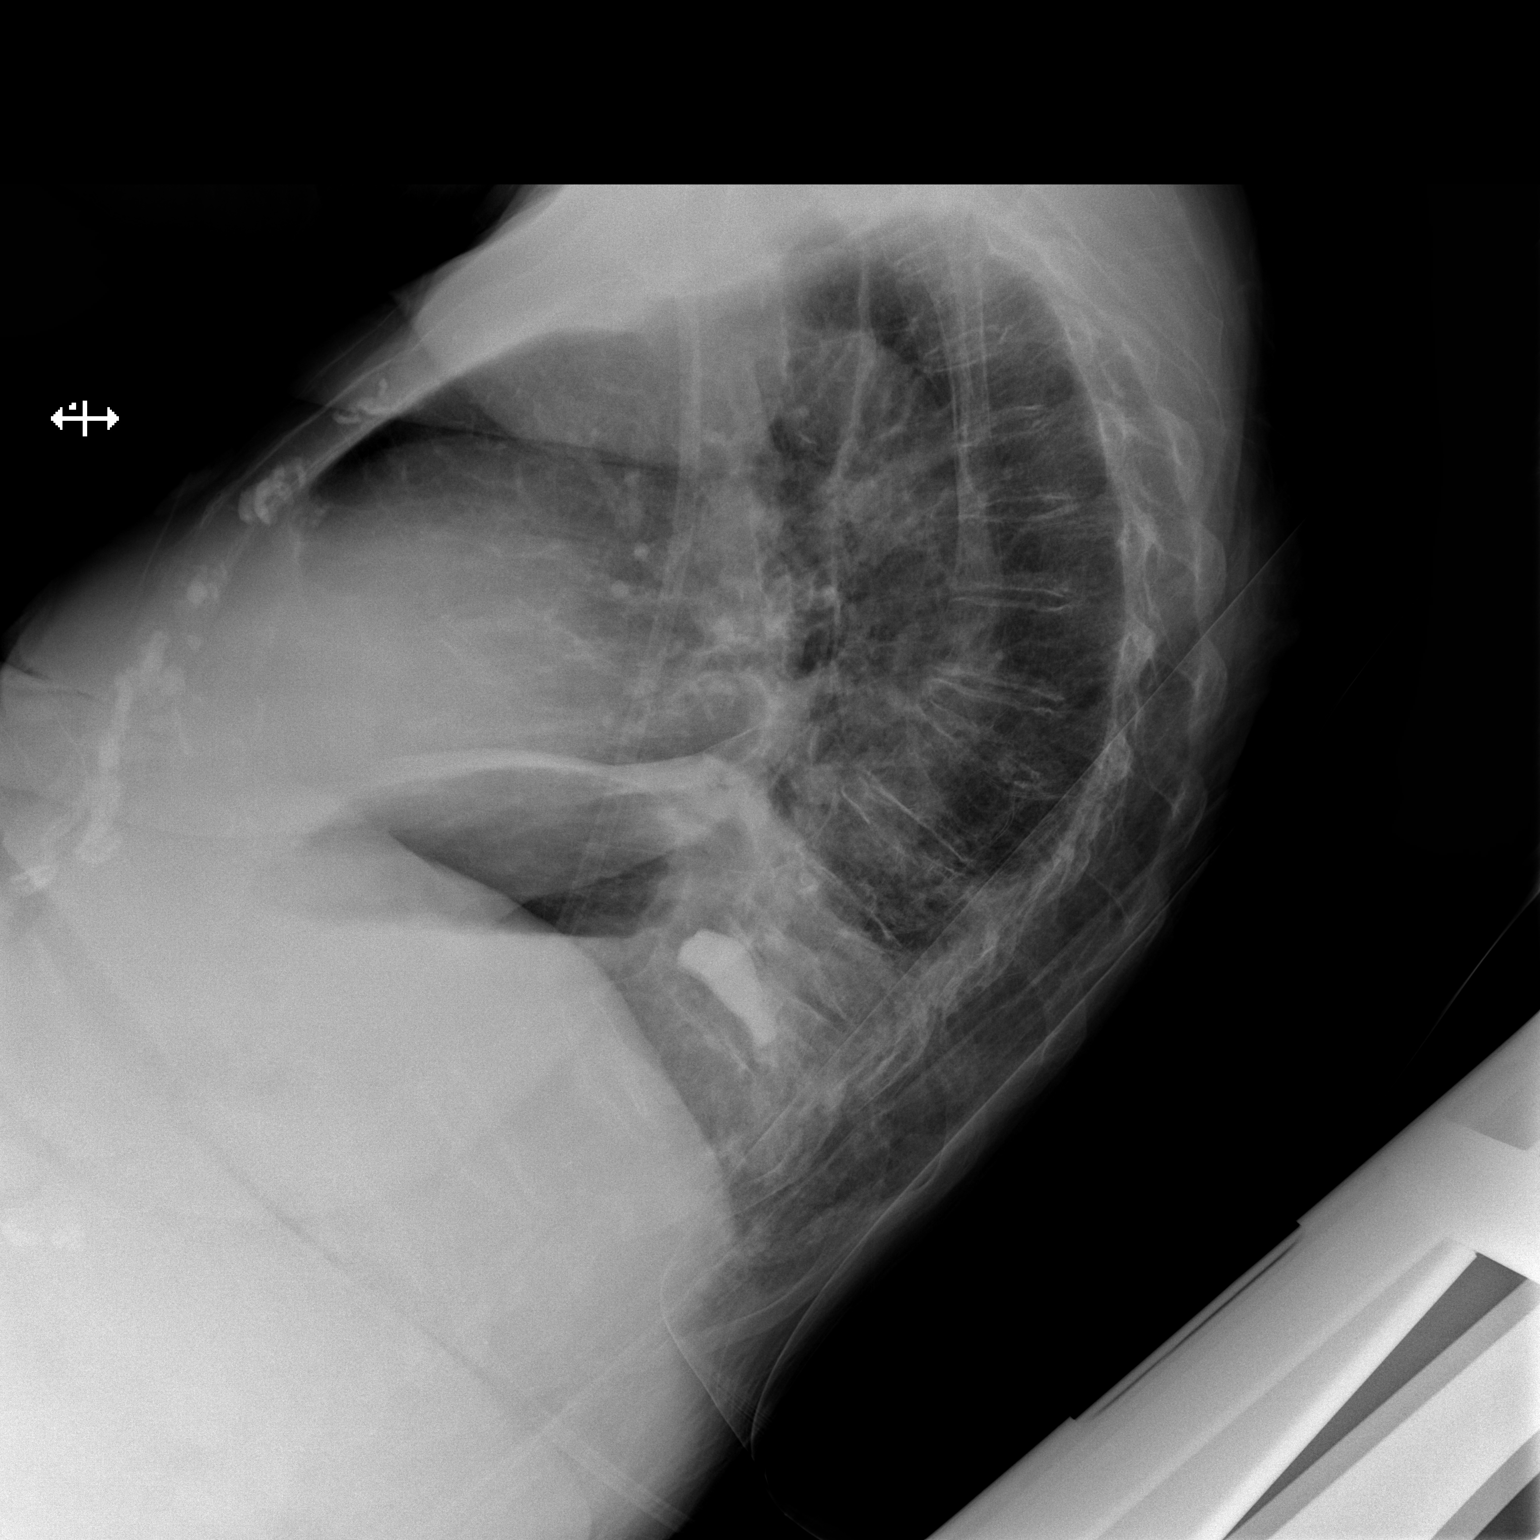

[x chest ap]
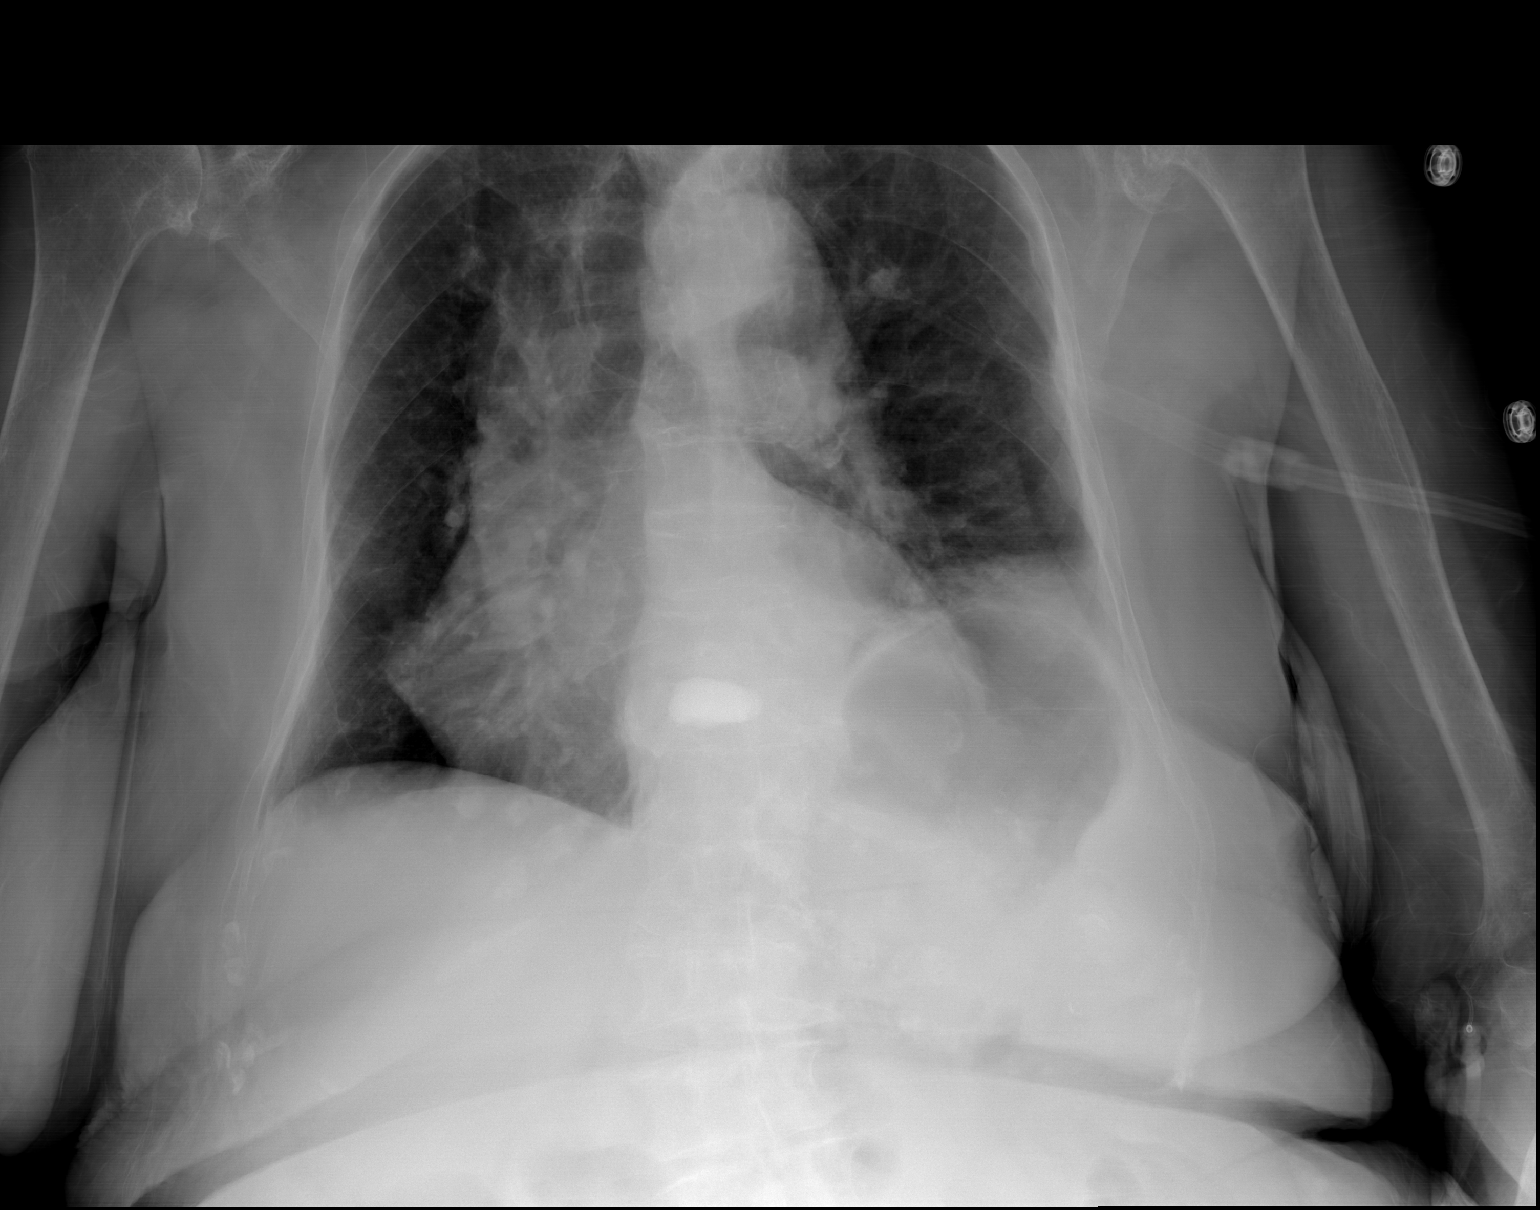

[2 of 2 positions shown; findings below may reference images not displayed]

FINDINGS: Enlargement of cardiac silhouette.

Tortuous aorta.

Mediastinal contours and pulmonary vascularity normal.

Rotation to the RIGHT.

Elevation of LEFT diaphragm unchanged.

LEFT basilar atelectasis and small LEFT pleural effusion, chronic.

No definite acute infiltrate or pneumothorax.

Bones demineralized with BILATERAL glenohumeral degenerative
changes, prior vertebroplasty of a lower thoracic vertebra, and
additional thoracic compression fractures.
IMPRESSION: Enlargement of cardiac silhouette.

Chronic elevation of LEFT diaphragm with LEFT basilar atelectasis
and small pleural effusion.

No acute abnormalities.

## 2015-02-17 IMAGING — CR DG HIP COMPLETE 2+V*R*
3 series · 3 of 3 positions shown · non-contrast
Comparison: Right hip and AP pelvis x-rays 11/12/2004. CT abdomen
and pelvis 08/31/2013.

CLINICAL DATA: Fell in the shower this morning injuring the right
hip. Prior right total hip arthroplasty.

EXAM:
RIGHT HIP - COMPLETE 2+ VIEW

[x pelvis (1 of 2)]
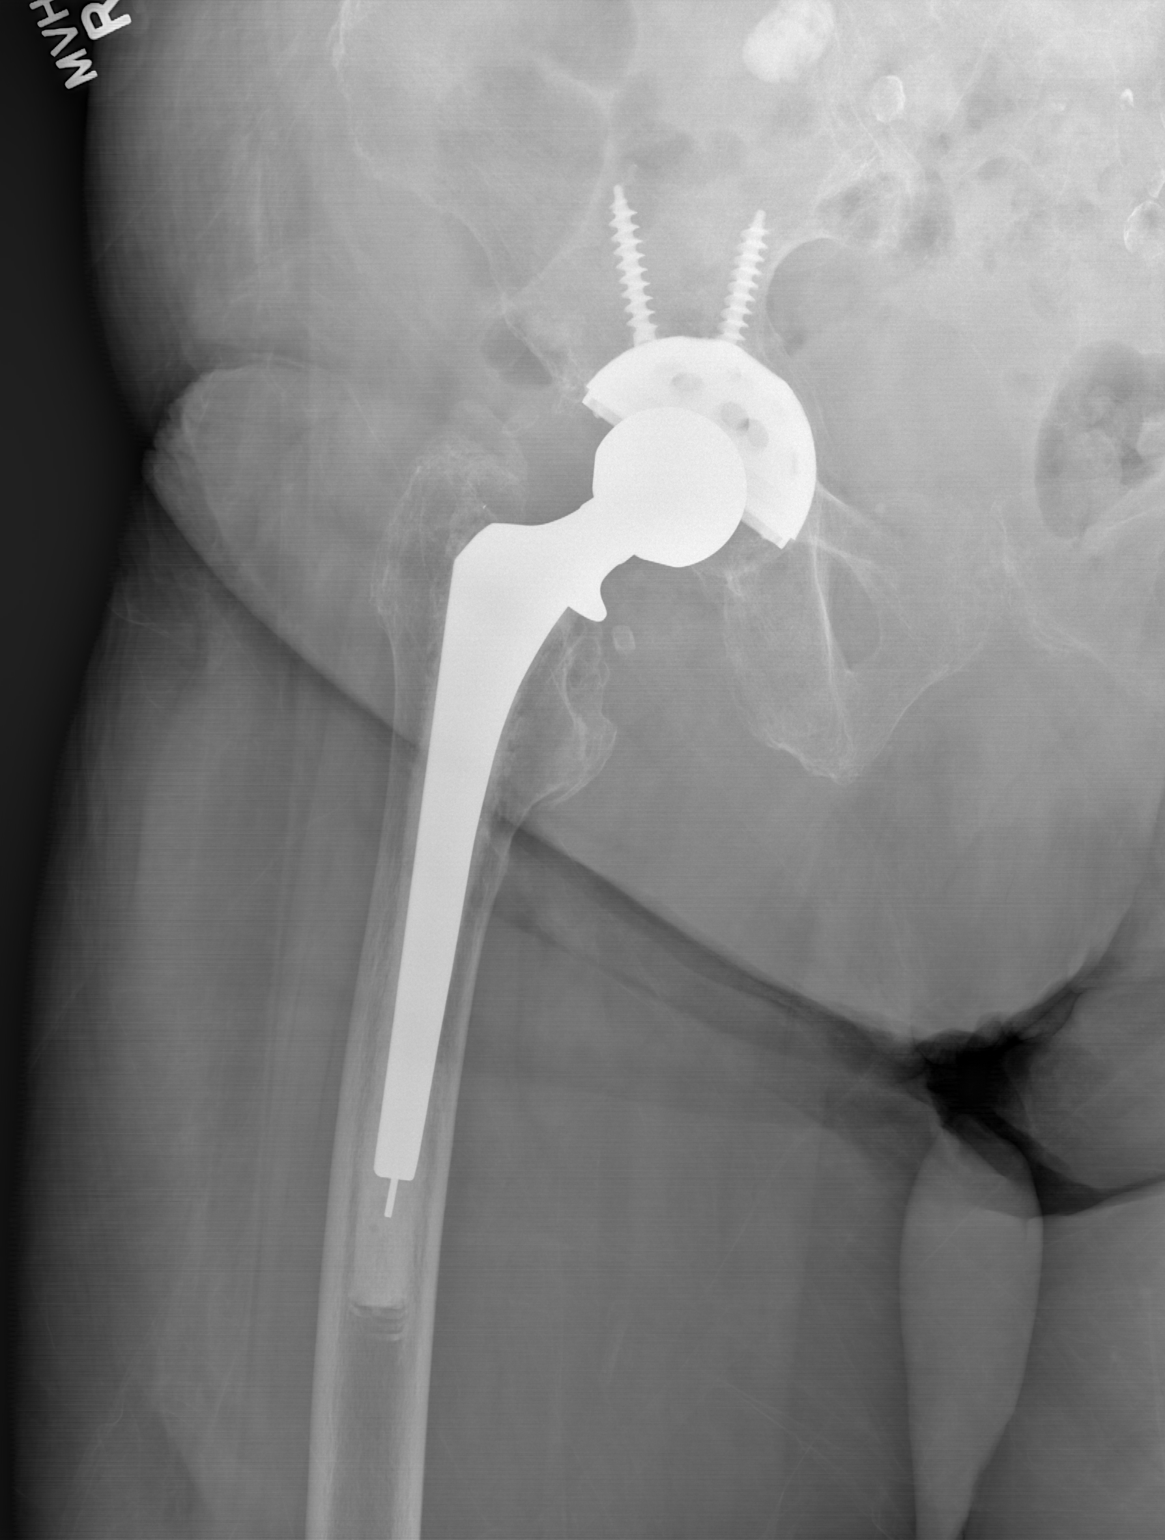

[x pelvis (2 of 2)]
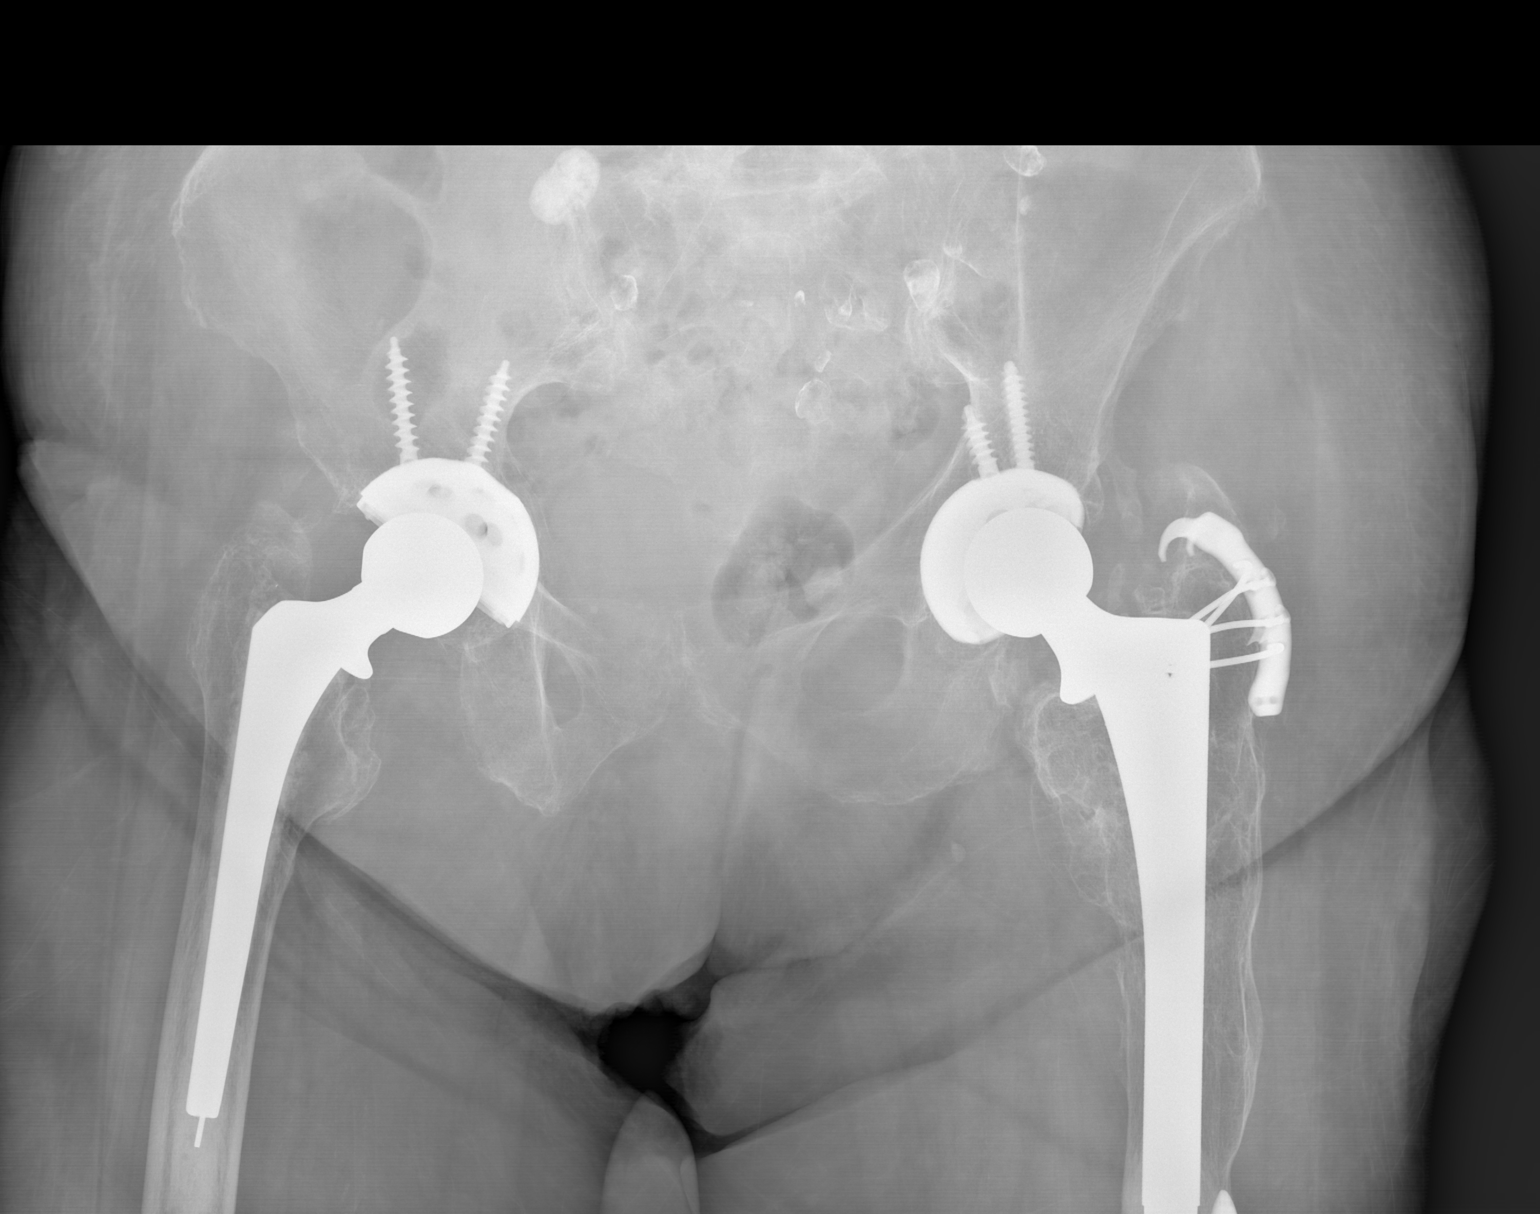

[w hip lat right]
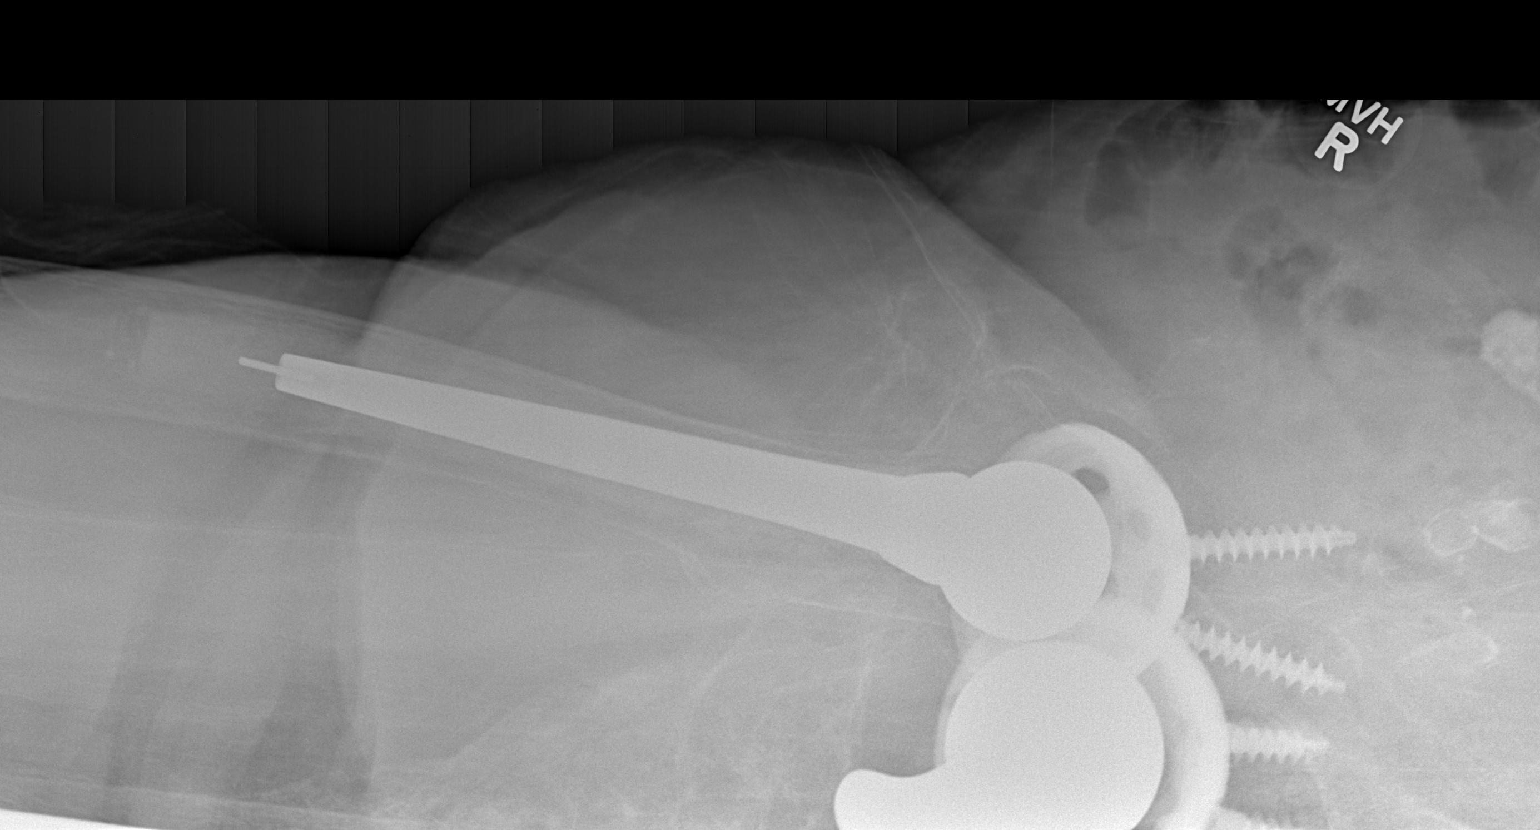

[3 of 3 positions shown; findings below may reference images not displayed]

FINDINGS: Right total hip arthroplasty with anatomic alignment. No
radiographic evidence of prosthetic loosening. Severe osseous
demineralization, markedly progressive since the prior examination.
Age indeterminate fracture involving the right superior pubic ramus,
new since the CT 1 month ago.

Included AP pelvis shows a prior left total hip arthroplasty with
anatomic alignment. No fractures elsewhere involving the bony
pelvis. Degenerative changes involving the visualized lower lumbar
spine. Calcification in the right upper pelvis, shown on prior CT to
represent a calcified epiploic appendage. Oral contrast material
within sigmoid colon diverticula related to the prior CT.
IMPRESSION: 1. Age indeterminate fracture of the right superior pubic ramus, new
since the CT 1 month ago. Please correlate with point tenderness.
2. No evidence of acute fracture elsewhere.
3. Severe osseous demineralization.
4. Prior bilateral total hip arthroplasties with anatomic alignment.
